# Patient Record
Sex: Female | Born: 1935 | Race: Black or African American | Hispanic: No | Smoking: Never smoker
Health system: Southern US, Community
[De-identification: ages and names within clinical notes are randomized; demographics above are authoritative.]

## PROBLEM LIST (undated history)

## (undated) DIAGNOSIS — I10 Essential (primary) hypertension: Secondary | ICD-10-CM

## (undated) DIAGNOSIS — E785 Hyperlipidemia, unspecified: Secondary | ICD-10-CM

## (undated) DIAGNOSIS — I701 Atherosclerosis of renal artery: Secondary | ICD-10-CM

## (undated) DIAGNOSIS — I255 Ischemic cardiomyopathy: Secondary | ICD-10-CM

## (undated) DIAGNOSIS — N189 Chronic kidney disease, unspecified: Secondary | ICD-10-CM

## (undated) DIAGNOSIS — I639 Cerebral infarction, unspecified: Secondary | ICD-10-CM

## (undated) DIAGNOSIS — F0390 Unspecified dementia without behavioral disturbance: Secondary | ICD-10-CM

## (undated) DIAGNOSIS — I2510 Atherosclerotic heart disease of native coronary artery without angina pectoris: Secondary | ICD-10-CM

## (undated) HISTORY — DX: Chronic kidney disease, unspecified: N18.9

## (undated) HISTORY — PX: APPENDECTOMY: SHX54

## (undated) HISTORY — DX: Atherosclerosis of renal artery: I70.1

## (undated) HISTORY — DX: Cerebral infarction, unspecified: I63.9

## (undated) HISTORY — DX: Unspecified dementia, unspecified severity, without behavioral disturbance, psychotic disturbance, mood disturbance, and anxiety: F03.90

## (undated) HISTORY — DX: Hyperlipidemia, unspecified: E78.5

## (undated) HISTORY — PX: TOTAL ABDOMINAL HYSTERECTOMY W/ BILATERAL SALPINGOOPHORECTOMY: SHX83

## (undated) HISTORY — DX: Essential (primary) hypertension: I10

## (undated) HISTORY — DX: Ischemic cardiomyopathy: I25.5

## (undated) HISTORY — PX: ANGIOPLASTY: SHX39

## (undated) HISTORY — DX: Atherosclerotic heart disease of native coronary artery without angina pectoris: I25.10

---

## 2002-03-20 ENCOUNTER — Encounter: Payer: Self-pay | Admitting: Emergency Medicine

## 2002-03-21 ENCOUNTER — Inpatient Hospital Stay (HOSPITAL_COMMUNITY): Admission: EM | Admit: 2002-03-21 | Discharge: 2002-04-04 | Payer: Self-pay | Admitting: *Deleted

## 2002-03-21 ENCOUNTER — Encounter: Payer: Self-pay | Admitting: Cardiology

## 2002-03-22 ENCOUNTER — Encounter: Payer: Self-pay | Admitting: *Deleted

## 2002-04-01 ENCOUNTER — Encounter: Payer: Self-pay | Admitting: Cardiology

## 2002-04-01 ENCOUNTER — Encounter: Payer: Self-pay | Admitting: Internal Medicine

## 2002-04-12 ENCOUNTER — Encounter: Admission: RE | Admit: 2002-04-12 | Discharge: 2002-04-12 | Payer: Self-pay | Admitting: Internal Medicine

## 2002-04-13 ENCOUNTER — Encounter: Payer: Self-pay | Admitting: *Deleted

## 2002-04-13 ENCOUNTER — Ambulatory Visit (HOSPITAL_COMMUNITY): Admission: RE | Admit: 2002-04-13 | Discharge: 2002-04-13 | Payer: Self-pay | Admitting: *Deleted

## 2002-05-27 ENCOUNTER — Encounter: Payer: Self-pay | Admitting: Gastroenterology

## 2002-05-31 ENCOUNTER — Ambulatory Visit (HOSPITAL_COMMUNITY): Admission: RE | Admit: 2002-05-31 | Discharge: 2002-05-31 | Payer: Self-pay | Admitting: Gastroenterology

## 2002-05-31 ENCOUNTER — Encounter: Payer: Self-pay | Admitting: Gastroenterology

## 2002-06-08 ENCOUNTER — Encounter: Admission: RE | Admit: 2002-06-08 | Discharge: 2002-09-06 | Payer: Self-pay | Admitting: *Deleted

## 2002-08-05 ENCOUNTER — Inpatient Hospital Stay (HOSPITAL_COMMUNITY): Admission: RE | Admit: 2002-08-05 | Discharge: 2002-08-13 | Payer: Self-pay | Admitting: Internal Medicine

## 2002-08-05 ENCOUNTER — Encounter: Admission: RE | Admit: 2002-08-05 | Discharge: 2002-08-05 | Payer: Self-pay | Admitting: Internal Medicine

## 2002-08-25 ENCOUNTER — Encounter: Admission: RE | Admit: 2002-08-25 | Discharge: 2002-08-25 | Payer: Self-pay | Admitting: Internal Medicine

## 2002-09-10 ENCOUNTER — Emergency Department (HOSPITAL_COMMUNITY): Admission: EM | Admit: 2002-09-10 | Discharge: 2002-09-10 | Payer: Self-pay | Admitting: Emergency Medicine

## 2002-09-20 ENCOUNTER — Encounter: Admission: RE | Admit: 2002-09-20 | Discharge: 2002-09-20 | Payer: Self-pay | Admitting: Internal Medicine

## 2002-10-18 ENCOUNTER — Emergency Department (HOSPITAL_COMMUNITY): Admission: EM | Admit: 2002-10-18 | Discharge: 2002-10-18 | Payer: Self-pay

## 2002-10-26 ENCOUNTER — Encounter: Admission: RE | Admit: 2002-10-26 | Discharge: 2002-10-26 | Payer: Self-pay | Admitting: Internal Medicine

## 2002-11-09 ENCOUNTER — Encounter: Admission: RE | Admit: 2002-11-09 | Discharge: 2002-11-09 | Payer: Self-pay | Admitting: Internal Medicine

## 2002-11-11 ENCOUNTER — Encounter: Admission: RE | Admit: 2002-11-11 | Discharge: 2003-07-13 | Payer: Self-pay | Admitting: *Deleted

## 2002-12-27 ENCOUNTER — Encounter: Admission: RE | Admit: 2002-12-27 | Discharge: 2003-03-27 | Payer: Self-pay | Admitting: *Deleted

## 2003-01-03 ENCOUNTER — Encounter: Admission: RE | Admit: 2003-01-03 | Discharge: 2003-01-03 | Payer: Self-pay | Admitting: Internal Medicine

## 2003-01-14 ENCOUNTER — Ambulatory Visit (HOSPITAL_COMMUNITY): Admission: RE | Admit: 2003-01-14 | Discharge: 2003-01-14 | Payer: Self-pay | Admitting: Internal Medicine

## 2003-01-19 ENCOUNTER — Encounter: Admission: RE | Admit: 2003-01-19 | Discharge: 2003-01-19 | Payer: Self-pay | Admitting: Internal Medicine

## 2003-02-09 ENCOUNTER — Emergency Department (HOSPITAL_COMMUNITY): Admission: EM | Admit: 2003-02-09 | Discharge: 2003-02-10 | Payer: Self-pay | Admitting: Emergency Medicine

## 2003-02-16 ENCOUNTER — Encounter: Admission: RE | Admit: 2003-02-16 | Discharge: 2003-02-16 | Payer: Self-pay | Admitting: Internal Medicine

## 2003-03-18 ENCOUNTER — Encounter: Admission: RE | Admit: 2003-03-18 | Discharge: 2003-03-18 | Payer: Self-pay | Admitting: Internal Medicine

## 2003-06-01 ENCOUNTER — Encounter: Admission: RE | Admit: 2003-06-01 | Discharge: 2003-06-01 | Payer: Self-pay | Admitting: Internal Medicine

## 2003-07-04 ENCOUNTER — Encounter: Admission: RE | Admit: 2003-07-04 | Discharge: 2003-07-04 | Payer: Self-pay | Admitting: Internal Medicine

## 2003-07-05 ENCOUNTER — Emergency Department (HOSPITAL_COMMUNITY): Admission: EM | Admit: 2003-07-05 | Discharge: 2003-07-05 | Payer: Self-pay | Admitting: Emergency Medicine

## 2003-07-08 ENCOUNTER — Encounter: Admission: RE | Admit: 2003-07-08 | Discharge: 2003-07-08 | Payer: Self-pay | Admitting: Internal Medicine

## 2003-08-11 ENCOUNTER — Encounter (HOSPITAL_COMMUNITY): Admission: RE | Admit: 2003-08-11 | Discharge: 2003-11-09 | Payer: Self-pay | Admitting: Nephrology

## 2003-08-25 ENCOUNTER — Encounter: Admission: RE | Admit: 2003-08-25 | Discharge: 2003-08-25 | Payer: Self-pay | Admitting: Internal Medicine

## 2003-08-29 ENCOUNTER — Encounter: Admission: RE | Admit: 2003-08-29 | Discharge: 2003-08-29 | Payer: Self-pay | Admitting: Internal Medicine

## 2003-09-05 ENCOUNTER — Encounter: Admission: RE | Admit: 2003-09-05 | Discharge: 2003-09-05 | Payer: Self-pay | Admitting: Internal Medicine

## 2003-11-15 ENCOUNTER — Ambulatory Visit (HOSPITAL_COMMUNITY): Admission: RE | Admit: 2003-11-15 | Discharge: 2003-11-16 | Payer: Self-pay | Admitting: Cardiology

## 2003-11-15 ENCOUNTER — Encounter (HOSPITAL_COMMUNITY): Admission: RE | Admit: 2003-11-15 | Discharge: 2004-02-13 | Payer: Self-pay | Admitting: Nephrology

## 2003-12-23 ENCOUNTER — Ambulatory Visit: Payer: Self-pay | Admitting: Internal Medicine

## 2004-01-16 ENCOUNTER — Ambulatory Visit (HOSPITAL_COMMUNITY): Admission: RE | Admit: 2004-01-16 | Discharge: 2004-01-16 | Payer: Self-pay | Admitting: Internal Medicine

## 2004-02-10 ENCOUNTER — Ambulatory Visit: Payer: Self-pay | Admitting: Cardiology

## 2004-03-01 ENCOUNTER — Encounter (HOSPITAL_COMMUNITY): Admission: RE | Admit: 2004-03-01 | Discharge: 2004-05-30 | Payer: Self-pay | Admitting: Nephrology

## 2004-03-16 ENCOUNTER — Ambulatory Visit: Payer: Self-pay | Admitting: Internal Medicine

## 2004-03-20 ENCOUNTER — Ambulatory Visit: Payer: Self-pay | Admitting: Internal Medicine

## 2004-03-21 ENCOUNTER — Encounter: Admission: RE | Admit: 2004-03-21 | Discharge: 2004-06-19 | Payer: Self-pay | Admitting: Internal Medicine

## 2004-04-16 ENCOUNTER — Ambulatory Visit: Payer: Self-pay | Admitting: Cardiology

## 2004-05-18 ENCOUNTER — Ambulatory Visit: Payer: Self-pay | Admitting: Cardiology

## 2004-06-07 ENCOUNTER — Encounter (HOSPITAL_COMMUNITY): Admission: RE | Admit: 2004-06-07 | Discharge: 2004-09-05 | Payer: Self-pay | Admitting: Nephrology

## 2004-09-13 ENCOUNTER — Encounter (HOSPITAL_COMMUNITY): Admission: RE | Admit: 2004-09-13 | Discharge: 2004-12-12 | Payer: Self-pay | Admitting: Nephrology

## 2004-11-26 ENCOUNTER — Ambulatory Visit: Payer: Self-pay | Admitting: Cardiology

## 2004-12-20 ENCOUNTER — Encounter (HOSPITAL_COMMUNITY): Admission: RE | Admit: 2004-12-20 | Discharge: 2005-03-20 | Payer: Self-pay | Admitting: Nephrology

## 2005-04-02 ENCOUNTER — Encounter (HOSPITAL_COMMUNITY): Admission: RE | Admit: 2005-04-02 | Discharge: 2005-07-01 | Payer: Self-pay | Admitting: Nephrology

## 2005-07-09 ENCOUNTER — Encounter (HOSPITAL_COMMUNITY): Admission: RE | Admit: 2005-07-09 | Discharge: 2005-10-07 | Payer: Self-pay | Admitting: Nephrology

## 2005-09-24 ENCOUNTER — Ambulatory Visit: Payer: Self-pay | Admitting: Internal Medicine

## 2005-09-26 ENCOUNTER — Ambulatory Visit (HOSPITAL_COMMUNITY): Admission: RE | Admit: 2005-09-26 | Discharge: 2005-09-26 | Payer: Self-pay | Admitting: Obstetrics and Gynecology

## 2005-10-15 ENCOUNTER — Encounter (HOSPITAL_COMMUNITY): Admission: RE | Admit: 2005-10-15 | Discharge: 2005-11-27 | Payer: Self-pay | Admitting: Nephrology

## 2005-11-14 ENCOUNTER — Ambulatory Visit: Payer: Self-pay | Admitting: Internal Medicine

## 2005-11-14 ENCOUNTER — Inpatient Hospital Stay (HOSPITAL_COMMUNITY): Admission: AD | Admit: 2005-11-14 | Discharge: 2005-11-16 | Payer: Self-pay | Admitting: Internal Medicine

## 2005-11-21 ENCOUNTER — Ambulatory Visit: Payer: Self-pay | Admitting: Internal Medicine

## 2005-11-28 ENCOUNTER — Ambulatory Visit: Payer: Self-pay | Admitting: Internal Medicine

## 2005-12-09 ENCOUNTER — Encounter (HOSPITAL_COMMUNITY): Admission: RE | Admit: 2005-12-09 | Discharge: 2006-03-09 | Payer: Self-pay | Admitting: Nephrology

## 2005-12-10 ENCOUNTER — Ambulatory Visit: Payer: Self-pay | Admitting: Internal Medicine

## 2005-12-12 ENCOUNTER — Ambulatory Visit: Payer: Self-pay | Admitting: Cardiology

## 2006-01-13 DIAGNOSIS — I509 Heart failure, unspecified: Secondary | ICD-10-CM

## 2006-01-13 DIAGNOSIS — I10 Essential (primary) hypertension: Secondary | ICD-10-CM

## 2006-01-13 DIAGNOSIS — I739 Peripheral vascular disease, unspecified: Secondary | ICD-10-CM

## 2006-01-13 DIAGNOSIS — I701 Atherosclerosis of renal artery: Secondary | ICD-10-CM

## 2006-01-13 DIAGNOSIS — I2510 Atherosclerotic heart disease of native coronary artery without angina pectoris: Secondary | ICD-10-CM

## 2006-01-13 DIAGNOSIS — E139 Other specified diabetes mellitus without complications: Secondary | ICD-10-CM | POA: Insufficient documentation

## 2006-01-13 DIAGNOSIS — N184 Chronic kidney disease, stage 4 (severe): Secondary | ICD-10-CM | POA: Insufficient documentation

## 2006-01-13 DIAGNOSIS — I252 Old myocardial infarction: Secondary | ICD-10-CM

## 2006-03-10 ENCOUNTER — Encounter (HOSPITAL_COMMUNITY): Admission: RE | Admit: 2006-03-10 | Discharge: 2006-06-08 | Payer: Self-pay | Admitting: Nephrology

## 2006-04-16 ENCOUNTER — Ambulatory Visit: Payer: Self-pay | Admitting: Internal Medicine

## 2006-04-16 ENCOUNTER — Encounter (INDEPENDENT_AMBULATORY_CARE_PROVIDER_SITE_OTHER): Payer: Self-pay | Admitting: *Deleted

## 2006-04-16 LAB — CONVERTED CEMR LAB
Glucose, Bld: 279 mg/dL
Hgb A1c MFr Bld: 6.5 %

## 2006-05-19 ENCOUNTER — Encounter (INDEPENDENT_AMBULATORY_CARE_PROVIDER_SITE_OTHER): Payer: Self-pay | Admitting: *Deleted

## 2006-06-10 ENCOUNTER — Encounter (HOSPITAL_COMMUNITY): Admission: RE | Admit: 2006-06-10 | Discharge: 2006-09-08 | Payer: Self-pay | Admitting: Nephrology

## 2006-07-21 ENCOUNTER — Telehealth: Payer: Self-pay | Admitting: *Deleted

## 2006-07-23 ENCOUNTER — Encounter: Payer: Self-pay | Admitting: Internal Medicine

## 2006-07-23 ENCOUNTER — Telehealth (INDEPENDENT_AMBULATORY_CARE_PROVIDER_SITE_OTHER): Payer: Self-pay | Admitting: *Deleted

## 2006-07-24 ENCOUNTER — Ambulatory Visit: Payer: Self-pay | Admitting: Internal Medicine

## 2006-07-24 LAB — CONVERTED CEMR LAB
Blood Glucose, Fingerstick: 151
Hgb A1c MFr Bld: 6 %

## 2006-07-28 ENCOUNTER — Encounter: Payer: Self-pay | Admitting: Internal Medicine

## 2006-08-19 ENCOUNTER — Telehealth (INDEPENDENT_AMBULATORY_CARE_PROVIDER_SITE_OTHER): Payer: Self-pay | Admitting: *Deleted

## 2006-08-19 ENCOUNTER — Encounter (INDEPENDENT_AMBULATORY_CARE_PROVIDER_SITE_OTHER): Payer: Self-pay | Admitting: *Deleted

## 2006-08-28 ENCOUNTER — Encounter: Payer: Self-pay | Admitting: Internal Medicine

## 2006-09-02 ENCOUNTER — Telehealth (INDEPENDENT_AMBULATORY_CARE_PROVIDER_SITE_OTHER): Payer: Self-pay | Admitting: *Deleted

## 2006-09-08 ENCOUNTER — Ambulatory Visit: Payer: Self-pay | Admitting: Internal Medicine

## 2006-09-08 ENCOUNTER — Encounter: Payer: Self-pay | Admitting: Internal Medicine

## 2006-09-09 LAB — CONVERTED CEMR LAB
ALT: 13 units/L (ref 0–35)
AST: 18 units/L (ref 0–37)
Albumin: 4.5 g/dL (ref 3.5–5.2)
Alkaline Phosphatase: 74 units/L (ref 39–117)
BUN: 24 mg/dL — ABNORMAL HIGH (ref 6–23)
CO2: 23 meq/L (ref 19–32)
Calcium: 9.7 mg/dL (ref 8.4–10.5)
Chloride: 102 meq/L (ref 96–112)
Cholesterol: 201 mg/dL — ABNORMAL HIGH (ref 0–200)
Creatinine, Ser: 1.96 mg/dL — ABNORMAL HIGH (ref 0.40–1.20)
Glucose, Bld: 228 mg/dL — ABNORMAL HIGH (ref 70–99)
HDL: 42 mg/dL (ref >39)
LDL Cholesterol: 125 mg/dL — ABNORMAL HIGH (ref 0–99)
Potassium: 3.9 meq/L (ref 3.5–5.3)
Sodium: 140 meq/L (ref 135–145)
Total Bilirubin: 0.9 mg/dL (ref 0.3–1.2)
Total CHOL/HDL Ratio: 4.8
Total Protein: 7.7 g/dL (ref 6.0–8.3)
Triglycerides: 169 mg/dL — ABNORMAL HIGH (ref <150)
VLDL: 34 mg/dL (ref 0–40)

## 2006-09-29 ENCOUNTER — Ambulatory Visit (HOSPITAL_COMMUNITY): Admission: RE | Admit: 2006-09-29 | Discharge: 2006-09-29 | Payer: Self-pay | Admitting: Internal Medicine

## 2006-10-07 ENCOUNTER — Encounter: Payer: Self-pay | Admitting: Internal Medicine

## 2006-10-07 ENCOUNTER — Encounter (HOSPITAL_COMMUNITY): Admission: RE | Admit: 2006-10-07 | Discharge: 2007-01-05 | Payer: Self-pay | Admitting: Nephrology

## 2006-10-16 ENCOUNTER — Telehealth: Payer: Self-pay | Admitting: Infectious Disease

## 2006-10-31 ENCOUNTER — Ambulatory Visit: Payer: Self-pay | Admitting: Internal Medicine

## 2006-10-31 DIAGNOSIS — E785 Hyperlipidemia, unspecified: Secondary | ICD-10-CM

## 2006-10-31 LAB — CONVERTED CEMR LAB
Blood Glucose, Fingerstick: 489
Hgb A1c MFr Bld: 7.2 %

## 2006-11-04 ENCOUNTER — Encounter: Payer: Self-pay | Admitting: Internal Medicine

## 2007-01-06 ENCOUNTER — Encounter (HOSPITAL_COMMUNITY): Admission: RE | Admit: 2007-01-06 | Discharge: 2007-03-20 | Payer: Self-pay | Admitting: Nephrology

## 2007-02-04 ENCOUNTER — Telehealth: Payer: Self-pay | Admitting: Internal Medicine

## 2007-03-24 ENCOUNTER — Telehealth: Payer: Self-pay | Admitting: Infectious Disease

## 2007-03-31 ENCOUNTER — Encounter: Admission: RE | Admit: 2007-03-31 | Discharge: 2007-06-29 | Payer: Self-pay | Admitting: Nephrology

## 2007-03-31 ENCOUNTER — Other Ambulatory Visit: Payer: Self-pay

## 2007-04-17 ENCOUNTER — Ambulatory Visit: Payer: Self-pay | Admitting: Internal Medicine

## 2007-04-17 ENCOUNTER — Encounter: Payer: Self-pay | Admitting: Internal Medicine

## 2007-04-17 ENCOUNTER — Ambulatory Visit (HOSPITAL_COMMUNITY): Admission: RE | Admit: 2007-04-17 | Discharge: 2007-04-17 | Payer: Self-pay | Admitting: Internal Medicine

## 2007-04-17 LAB — CONVERTED CEMR LAB
Albumin: 4 g/dL (ref 3.5–5.2)
BUN: 23 mg/dL (ref 6–23)
Basophils Absolute: 0 10*3/uL (ref 0.0–0.1)
Basophils Relative: 0 % (ref 0–1)
Blood Glucose, Fingerstick: 348
CO2: 22 meq/L (ref 19–32)
Calcium: 9.7 mg/dL (ref 8.4–10.5)
Chloride: 103 meq/L (ref 96–112)
Cholesterol: 211 mg/dL — ABNORMAL HIGH (ref 0–200)
Creatinine, Ser: 1.77 mg/dL — ABNORMAL HIGH (ref 0.40–1.20)
Eosinophils Absolute: 0.1 10*3/uL (ref 0.0–0.7)
Eosinophils Relative: 1 % (ref 0–5)
Glucose, Bld: 326 mg/dL — ABNORMAL HIGH (ref 70–99)
HCT: 38.4 % (ref 36.0–46.0)
HDL: 41 mg/dL (ref >39)
Hemoglobin: 12.3 g/dL (ref 12.0–15.0)
Hgb A1c MFr Bld: 7.4 %
LDL Cholesterol: 130 mg/dL — ABNORMAL HIGH (ref 0–99)
Lymphocytes Relative: 33 % (ref 12–46)
Lymphs Abs: 1.9 10*3/uL (ref 0.7–4.0)
MCHC: 32 g/dL (ref 30.0–36.0)
MCV: 97.5 fL (ref 78.0–100.0)
Monocytes Absolute: 0.4 10*3/uL (ref 0.1–1.0)
Monocytes Relative: 7 % (ref 3–12)
Neutro Abs: 3.3 10*3/uL (ref 1.7–7.7)
Neutrophils Relative %: 59 % (ref 43–77)
Phosphorus: 3 mg/dL (ref 2.3–4.6)
Platelets: 315 10*3/uL (ref 150–400)
Potassium: 4.5 meq/L (ref 3.5–5.3)
RBC: 3.94 M/uL (ref 3.87–5.11)
RDW: 14.1 % (ref 11.5–15.5)
Sodium: 139 meq/L (ref 135–145)
Total CHOL/HDL Ratio: 5.1
Triglycerides: 200 mg/dL — ABNORMAL HIGH (ref <150)
VLDL: 40 mg/dL (ref 0–40)
WBC: 5.7 10*3/uL (ref 4.0–10.5)

## 2007-05-21 ENCOUNTER — Encounter: Payer: Self-pay | Admitting: Internal Medicine

## 2007-05-22 ENCOUNTER — Ambulatory Visit: Payer: Self-pay | Admitting: *Deleted

## 2007-05-22 ENCOUNTER — Ambulatory Visit: Payer: Self-pay | Admitting: Infectious Diseases

## 2007-05-22 LAB — CONVERTED CEMR LAB: Blood Glucose, Fingerstick: 325

## 2007-05-25 ENCOUNTER — Telehealth (INDEPENDENT_AMBULATORY_CARE_PROVIDER_SITE_OTHER): Payer: Self-pay | Admitting: *Deleted

## 2007-05-27 ENCOUNTER — Encounter (INDEPENDENT_AMBULATORY_CARE_PROVIDER_SITE_OTHER): Payer: Self-pay | Admitting: *Deleted

## 2007-06-04 ENCOUNTER — Ambulatory Visit: Payer: Self-pay | Admitting: *Deleted

## 2007-06-04 LAB — CONVERTED CEMR LAB: Insulin/Carbohydrate Ratio: 1

## 2007-06-05 ENCOUNTER — Encounter: Payer: Self-pay | Admitting: Internal Medicine

## 2007-06-08 ENCOUNTER — Encounter: Payer: Self-pay | Admitting: Internal Medicine

## 2007-06-09 ENCOUNTER — Telehealth (INDEPENDENT_AMBULATORY_CARE_PROVIDER_SITE_OTHER): Payer: Self-pay | Admitting: *Deleted

## 2007-06-11 ENCOUNTER — Ambulatory Visit: Payer: Self-pay | Admitting: Hospitalist

## 2007-06-11 LAB — CONVERTED CEMR LAB: Insulin/Carbohydrate Ratio: 1

## 2007-07-02 ENCOUNTER — Encounter: Payer: Self-pay | Admitting: Internal Medicine

## 2007-07-02 ENCOUNTER — Ambulatory Visit: Payer: Self-pay | Admitting: Internal Medicine

## 2007-07-07 ENCOUNTER — Telehealth: Payer: Self-pay | Admitting: *Deleted

## 2007-07-13 ENCOUNTER — Encounter: Payer: Self-pay | Admitting: Internal Medicine

## 2007-07-20 ENCOUNTER — Telehealth: Payer: Self-pay | Admitting: Internal Medicine

## 2007-07-21 ENCOUNTER — Encounter (HOSPITAL_COMMUNITY): Admission: RE | Admit: 2007-07-21 | Discharge: 2007-10-19 | Payer: Self-pay | Admitting: Nephrology

## 2007-07-22 ENCOUNTER — Telehealth: Payer: Self-pay | Admitting: Internal Medicine

## 2007-08-07 ENCOUNTER — Telehealth (INDEPENDENT_AMBULATORY_CARE_PROVIDER_SITE_OTHER): Payer: Self-pay | Admitting: *Deleted

## 2007-08-10 ENCOUNTER — Telehealth: Payer: Self-pay | Admitting: Internal Medicine

## 2007-09-14 ENCOUNTER — Encounter: Payer: Self-pay | Admitting: Internal Medicine

## 2007-10-27 ENCOUNTER — Ambulatory Visit: Payer: Self-pay | Admitting: Internal Medicine

## 2007-10-27 LAB — CONVERTED CEMR LAB
Blood Glucose, Fingerstick: 228
Hgb A1c MFr Bld: 7.1 %

## 2007-11-04 ENCOUNTER — Telehealth: Payer: Self-pay | Admitting: Internal Medicine

## 2007-11-09 ENCOUNTER — Telehealth: Payer: Self-pay | Admitting: Internal Medicine

## 2007-11-19 ENCOUNTER — Encounter: Payer: Self-pay | Admitting: Internal Medicine

## 2007-11-26 ENCOUNTER — Encounter (HOSPITAL_COMMUNITY): Admission: RE | Admit: 2007-11-26 | Discharge: 2008-02-24 | Payer: Self-pay | Admitting: Nephrology

## 2007-11-30 ENCOUNTER — Telehealth: Payer: Self-pay | Admitting: *Deleted

## 2007-11-30 ENCOUNTER — Ambulatory Visit: Payer: Self-pay | Admitting: Internal Medicine

## 2007-11-30 ENCOUNTER — Encounter (INDEPENDENT_AMBULATORY_CARE_PROVIDER_SITE_OTHER): Payer: Self-pay | Admitting: Internal Medicine

## 2007-11-30 LAB — CONVERTED CEMR LAB
Microalb Creat Ratio: 10.3 mg/g (ref 0.0–30.0)
OCCULT 1: POSITIVE

## 2007-12-04 ENCOUNTER — Ambulatory Visit (HOSPITAL_COMMUNITY): Admission: RE | Admit: 2007-12-04 | Discharge: 2007-12-04 | Payer: Self-pay | Admitting: Internal Medicine

## 2007-12-16 ENCOUNTER — Telehealth: Payer: Self-pay | Admitting: Internal Medicine

## 2007-12-28 ENCOUNTER — Ambulatory Visit: Payer: Self-pay | Admitting: Gastroenterology

## 2007-12-28 DIAGNOSIS — K219 Gastro-esophageal reflux disease without esophagitis: Secondary | ICD-10-CM | POA: Insufficient documentation

## 2008-01-15 ENCOUNTER — Ambulatory Visit: Payer: Self-pay | Admitting: Gastroenterology

## 2008-02-18 ENCOUNTER — Ambulatory Visit: Payer: Self-pay | Admitting: Internal Medicine

## 2008-02-18 LAB — CONVERTED CEMR LAB
Blood Glucose, Fingerstick: 295
Hgb A1c MFr Bld: 7.5 %

## 2008-03-15 ENCOUNTER — Encounter (HOSPITAL_COMMUNITY): Admission: RE | Admit: 2008-03-15 | Discharge: 2008-06-13 | Payer: Self-pay | Admitting: Nephrology

## 2008-03-30 ENCOUNTER — Encounter: Payer: Self-pay | Admitting: Internal Medicine

## 2008-05-17 ENCOUNTER — Encounter: Payer: Self-pay | Admitting: Internal Medicine

## 2008-05-31 ENCOUNTER — Telehealth: Payer: Self-pay | Admitting: *Deleted

## 2008-06-14 ENCOUNTER — Encounter: Payer: Self-pay | Admitting: Internal Medicine

## 2008-06-15 ENCOUNTER — Telehealth: Payer: Self-pay | Admitting: Internal Medicine

## 2008-06-21 ENCOUNTER — Encounter: Payer: Self-pay | Admitting: Internal Medicine

## 2008-08-02 ENCOUNTER — Telehealth: Payer: Self-pay | Admitting: *Deleted

## 2008-08-24 ENCOUNTER — Encounter: Payer: Self-pay | Admitting: Internal Medicine

## 2008-09-09 ENCOUNTER — Encounter: Payer: Self-pay | Admitting: Internal Medicine

## 2008-09-20 ENCOUNTER — Encounter (HOSPITAL_COMMUNITY): Admission: RE | Admit: 2008-09-20 | Discharge: 2008-12-19 | Payer: Self-pay | Admitting: Nephrology

## 2008-10-03 ENCOUNTER — Telehealth: Payer: Self-pay | Admitting: Internal Medicine

## 2008-11-08 ENCOUNTER — Encounter: Payer: Self-pay | Admitting: Internal Medicine

## 2008-11-08 ENCOUNTER — Ambulatory Visit (HOSPITAL_COMMUNITY): Admission: RE | Admit: 2008-11-08 | Discharge: 2008-11-08 | Payer: Self-pay | Admitting: Internal Medicine

## 2008-11-08 ENCOUNTER — Ambulatory Visit: Payer: Self-pay | Admitting: Internal Medicine

## 2008-11-08 DIAGNOSIS — R5381 Other malaise: Secondary | ICD-10-CM

## 2008-11-08 DIAGNOSIS — R5383 Other fatigue: Secondary | ICD-10-CM

## 2008-11-08 LAB — CONVERTED CEMR LAB
Blood Glucose, Fingerstick: 183
Hgb A1c MFr Bld: 8.4 %

## 2008-11-09 ENCOUNTER — Encounter: Payer: Self-pay | Admitting: Internal Medicine

## 2008-11-11 LAB — CONVERTED CEMR LAB
ALT: 10 units/L (ref 0–35)
AST: 19 units/L (ref 0–37)
Alkaline Phosphatase: 82 units/L (ref 39–117)
Creatinine, Ser: 1.92 mg/dL — ABNORMAL HIGH (ref 0.40–1.20)
Sodium: 139 meq/L (ref 135–145)
Total Bilirubin: 0.6 mg/dL (ref 0.3–1.2)
Total Protein: 7.6 g/dL (ref 6.0–8.3)

## 2008-12-06 ENCOUNTER — Encounter (INDEPENDENT_AMBULATORY_CARE_PROVIDER_SITE_OTHER): Payer: Self-pay | Admitting: *Deleted

## 2008-12-26 ENCOUNTER — Encounter (HOSPITAL_COMMUNITY): Admission: RE | Admit: 2008-12-26 | Discharge: 2009-03-26 | Payer: Self-pay | Admitting: Nephrology

## 2009-01-06 ENCOUNTER — Telehealth (INDEPENDENT_AMBULATORY_CARE_PROVIDER_SITE_OTHER): Payer: Self-pay | Admitting: *Deleted

## 2009-03-08 ENCOUNTER — Telehealth (INDEPENDENT_AMBULATORY_CARE_PROVIDER_SITE_OTHER): Payer: Self-pay | Admitting: *Deleted

## 2009-04-07 ENCOUNTER — Encounter (HOSPITAL_COMMUNITY): Admission: RE | Admit: 2009-04-07 | Discharge: 2009-07-06 | Payer: Self-pay | Admitting: Nephrology

## 2009-04-12 ENCOUNTER — Encounter: Payer: Self-pay | Admitting: Internal Medicine

## 2009-04-24 ENCOUNTER — Ambulatory Visit: Payer: Self-pay | Admitting: Internal Medicine

## 2009-04-24 LAB — CONVERTED CEMR LAB: Hgb A1c MFr Bld: 8.6 %

## 2009-05-09 ENCOUNTER — Telehealth: Payer: Self-pay | Admitting: Internal Medicine

## 2009-05-31 ENCOUNTER — Telehealth: Payer: Self-pay | Admitting: Internal Medicine

## 2009-06-28 ENCOUNTER — Telehealth: Payer: Self-pay | Admitting: Internal Medicine

## 2009-07-20 ENCOUNTER — Encounter (HOSPITAL_COMMUNITY): Admission: RE | Admit: 2009-07-20 | Discharge: 2009-10-18 | Payer: Self-pay | Admitting: Nephrology

## 2009-08-03 ENCOUNTER — Telehealth: Payer: Self-pay | Admitting: Internal Medicine

## 2009-08-09 ENCOUNTER — Emergency Department (HOSPITAL_COMMUNITY): Admission: EM | Admit: 2009-08-09 | Discharge: 2009-08-10 | Payer: Self-pay | Admitting: Emergency Medicine

## 2009-09-06 ENCOUNTER — Encounter: Payer: Self-pay | Admitting: Internal Medicine

## 2009-10-03 ENCOUNTER — Telehealth: Payer: Self-pay | Admitting: Internal Medicine

## 2009-10-05 ENCOUNTER — Telehealth: Payer: Self-pay | Admitting: Internal Medicine

## 2009-10-30 ENCOUNTER — Telehealth: Payer: Self-pay | Admitting: Internal Medicine

## 2009-10-31 ENCOUNTER — Encounter (HOSPITAL_COMMUNITY): Admission: RE | Admit: 2009-10-31 | Discharge: 2010-01-29 | Payer: Self-pay | Admitting: Nephrology

## 2009-11-29 ENCOUNTER — Encounter: Payer: Self-pay | Admitting: Internal Medicine

## 2010-03-08 ENCOUNTER — Telehealth (INDEPENDENT_AMBULATORY_CARE_PROVIDER_SITE_OTHER): Payer: Self-pay | Admitting: *Deleted

## 2010-03-21 ENCOUNTER — Ambulatory Visit: Admission: RE | Admit: 2010-03-21 | Discharge: 2010-03-21 | Payer: Self-pay | Source: Home / Self Care

## 2010-03-21 LAB — CONVERTED CEMR LAB
BUN: 22 mg/dL (ref 6–23)
Bilirubin Urine: NEGATIVE
Blood Glucose, Fingerstick: 178
CO2: 24 meq/L (ref 19–32)
Calcium: 9.3 mg/dL (ref 8.4–10.5)
Chloride: 103 meq/L (ref 96–112)
Creatinine, Ser: 1.66 mg/dL — ABNORMAL HIGH (ref 0.40–1.20)
Glucose, Bld: 167 mg/dL — ABNORMAL HIGH (ref 70–99)
Glucose, Urine, Semiquant: NEGATIVE
Hgb A1c MFr Bld: 7.6 %
Ketones, urine, test strip: NEGATIVE
Nitrite: NEGATIVE
Potassium: 4.3 meq/L (ref 3.5–5.3)
Protein, U semiquant: >=300
Sodium: 138 meq/L (ref 135–145)
Specific Gravity, Urine: <1.005
Urobilinogen, UA: 0.2
WBC Urine, dipstick: NEGATIVE
pH: 5.5

## 2010-03-26 LAB — GLUCOSE, CAPILLARY: Glucose-Capillary: 178 mg/dL — ABNORMAL HIGH (ref 70–99)

## 2010-04-02 ENCOUNTER — Encounter: Payer: Self-pay | Admitting: Internal Medicine

## 2010-04-05 ENCOUNTER — Telehealth: Payer: Self-pay | Admitting: *Deleted

## 2010-04-10 NOTE — Progress Notes (Signed)
Summary: Refill/gh  Phone Note Refill Request Message from:  Fax from Pharmacy on May 09, 2009 11:28 AM  Refills Requested: Medication #1:  NORVASC 10 MG TABS Take one tablet daily   Last Refilled: 04/03/2009  Method Requested: Electronic Initial call taken by: Angelina Ok RN,  May 09, 2009 11:29 AM  Follow-up for Phone Call        Refill approved-nurse to complete Follow-up by: Julaine Fusi  DO,  May 11, 2009 3:05 PM    Prescriptions: NORVASC 10 MG TABS (AMLODIPINE BESYLATE) Take one tablet daily  #31 x 11   Entered and Authorized by:   Julaine Fusi  DO   Signed by:   Julaine Fusi  DO on 05/11/2009   Method used:   Faxed to ...       [facility] (ret[facility])       [address]       [place], [place]  [address]       Ph: [phone]       Fax: [phone]   RxID:   [id number]

## 2010-04-10 NOTE — Assessment & Plan Note (Signed)
Summary: FU VISIT PER D. RILEY/DS   Vital Signs:  Patient profile:   77 year old female Height:      62.25 inches (158.12 cm) Weight:      138.01 pounds (62.73 kg) BMI:     25.13 Temp:     98.8 degrees F (37.11 degrees C) oral Pulse rate:   71 / minute BP sitting:   134 / 74  (left arm)  Vitals Entered By: Angelina Ok RN (April 24, 2009 10:37 AM) Is Patient Diabetic? Yes Did you bring your meter with you today? Yes Nutritional Status BMI of 19 -24 = normal CBG Result 521  Have you ever been in a relationship where you felt threatened, hurt or afraid?No   Does patient need assistance? Functional Status Self care Ambulation Impaired:Risk for fall Comments Out of Insulin.  Sugars are high.  Tired and dizzy.  Occurs early in the am.  Tired most of the day.  Check up   Primary Care Provider:  Marinda Elk, MD   History of Present Illness: Ms. Mordan comes in today for routine follow-up. I have not seen her in several months. Her husband has cancer and it seems her son is taking care of her. He is some what agressive when talking about her medications and is making an incorrect connection between her BP meds and blood sugar medications. He tells me that he only gives all of her medications once a day and that all he plans to do. He thinks giving her her medicines twice a day as prescribed makes her sugars "go bad". He was resistant to gentle and tactful re-education.When speaking to Ms. Emme, she says she fine, she is just interested in leaving teh office and seems a little more agitated than usual. Her memory seems much worse to me today. Her son reports that she has periods of confusion but thinks its "just old age". I cannot illicit how she is taking her insulin. Her CBGs are widely fluctuating and have no specific pattern but are much worse than a few months ago when I felt like I was making some progress.  Depression History:      The patient denies a depressed mood most  of the day and a diminished interest in her usual daily activities.         Preventive Screening-Counseling & Management  Alcohol-Tobacco     Smoking Status: never  Current Medications (verified): 1)  Lasix 40 Mg Tabs (Furosemide) .... Take 1 Tablet By Mouth Once A Day 2)  Hydralazine Hcl 50 Mg Tabs (Hydralazine Hcl) .... Take 1 Tablet By Mouth Once A Day 3)  Enalapril Maleate 10 Mg Tabs (Enalapril Maleate) .... Take 1 Tablet By Mouth Once A Day 4)  Isosorbide Dinitrate 20 Mg Tabs (Isosorbide Dinitrate) .... Take 1 Tablet By Mouth Once A Day 5)  Zebeta 5 Mg Tabs (Bisoprolol Fumarate) .... Take 1 Tablet By Mouth Once A Day 6)  Norvasc 10 Mg Tabs (Amlodipine Besylate) .... Take One Tablet Daily 7)  Calcitriol 0.25 Mcg Caps (Calcitriol) .... Take 1 Capsule By Mouth Once A Day 8)  Aranesp (Albumin Free) 60 Mcg/ml Soln (Darbepoetin Alfa-Polysorbate) .... Every 3 Weeks Subcutaneously 9)  Zocor 80 Mg  Tabs (Simvastatin) .... Take 1 Tablet By Mouth Once A Day 10)  Aspirin 81 Mg Tbec (Aspirin) .... Take 1 Tablet By Mouth Once A Day 11)  Lantus 100 Unit/ml  Soln (Insulin Glargine) .... Inject 15 Sq Units At Bedtime 12)  Freestyle Lite Test   Strp (Glucose Blood) .... Use To Test Blood Glucose 4x/day Before Meals 13)  Compact Test Strips Dc 51ct/ 91478 .... Test 3-4 Times A Day As Directed 14)  Accu-Chek Softclix Lancets   Misc (Lancets) .... Test 3-4 Times A Day As Directed 15)  Humalog Kwikpen 100 Unit/ml  Soln (Insulin Lispro (Human)) .... Take As Directed According To Stoplight Scale- Up To A Total of 25 Units/day 16)  Lantus Solostar 100 Unit/ml Soln (Insulin Glargine) .... Inject 20 Units At Bedtime or As Directed  Allergies (verified): No Known Drug Allergies  Review of Systems       Worsening memory loss, fatigue, decreased apetite. Otherwise neg.  Physical Exam  General:  alert and well-developed.   Lungs:  normal respiratory effort and normal breath sounds.   Heart:  brady,  regular Abdomen:  soft and non-tender.   Neurologic:  A&O. cranial nerves II-XII intact, strength normal in all extremities, sensation intact to light touch, sensation intact to pinprick, gait normal, and finger-to-nose normal.  Refused to answer memory questions saying they were "silly" and just laughed. Skin:  no rashes Psych:  mildly agitated   Impression & Recommendations:  Problem # 1:  DIABETES MELLITUS, TYPE II (ICD-250.00) Has not been using Lantus insulin.  Has been using Humalog 10 units three times a day only. No oral meds. I discussed the importance of starting on the lantus- her A1C is now 8.6 and I think a good goal for her would be 7.0. I tried educating her son. He was concerned about teh cost of teh medication and I think he is trying tostretch out all of her medications.  Her updated medication list for this problem includes:    Enalapril Maleate 10 Mg Tabs (Enalapril maleate) .Marland Kitchen... Take 1 tablet by mouth once a day    Aspirin 81 Mg Tbec (Aspirin) .Marland Kitchen... Take 1 tablet by mouth once a day    Lantus 100 Unit/ml Soln (Insulin glargine) ..... Inject 15 sq units at bedtime    Humalog Kwikpen 100 Unit/ml Soln (Insulin lispro (human)) .Marland Kitchen... Take as directed according to stoplight scale- up to a total of 25 units/day    Lantus Solostar 100 Unit/ml Soln (Insulin glargine) ..... Inject 20 units at bedtime or as directed  Orders: T- Capillary Blood Glucose (29562) T-Hgb A1C (in-house) (13086VH)  Labs Reviewed: Creat: 1.92 (11/08/2008)     Last Eye Exam: Cataract.  referred to Dr. Vonna Kotyk for cataract surgery Mild non-proliferative diabetic retinopathy.  os & od, s/p PRP OD, no further retina treatment indicated at this point  (12/06/2008) Reviewed HgBA1c results: 8.6 (04/24/2009)  8.4 (11/08/2008)  Problem # 2:  HYPERTENSION (ICD-401.9) Her BP is good today but she has taken her medication this AM.  I suspect it runs much higher at night when she is due for her two times a day  dosing.  I changed her medication list to reflect how she is taking them- teh son says he will not give them more frequently so I will change the number I am prescribing- as stated earlier I think he is trying to stretch out her meds due to cost etc... I realize these are not ideal doses of her vasodilators, but at least we can monitor her pressures and she how she is doing. I aslked him to get a BP cuff for home.   Her updated medication list for this problem includes:    Lasix 40 Mg Tabs (Furosemide) .Marland Kitchen... Take 1  tablet by mouth once a day    Hydralazine Hcl 50 Mg Tabs (Hydralazine hcl) .Marland Kitchen... Take 1 tablet by mouth once a day    Enalapril Maleate 10 Mg Tabs (Enalapril maleate) .Marland Kitchen... Take 1 tablet by mouth once a day    Zebeta 5 Mg Tabs (Bisoprolol fumarate) .Marland Kitchen... Take 1 tablet by mouth once a day    Norvasc 10 Mg Tabs (Amlodipine besylate) .Marland Kitchen... Take one tablet daily  BP today: 134/74 Prior BP: 174/80 (11/08/2008)  Labs Reviewed: K+: 4.0 (11/08/2008) Creat: : 1.92 (11/08/2008)   Chol: 211 (04/17/2007)   HDL: 41 (04/17/2007)   LDL: 130 (04/17/2007)   TG: 200 (04/17/2007)  Complete Medication List: 1)  Lasix 40 Mg Tabs (Furosemide) .... Take 1 tablet by mouth once a day 2)  Hydralazine Hcl 50 Mg Tabs (Hydralazine hcl) .... Take 1 tablet by mouth once a day 3)  Enalapril Maleate 10 Mg Tabs (Enalapril maleate) .... Take 1 tablet by mouth once a day 4)  Isosorbide Dinitrate 20 Mg Tabs (Isosorbide dinitrate) .... Take 1 tablet by mouth once a day 5)  Zebeta 5 Mg Tabs (Bisoprolol fumarate) .... Take 1 tablet by mouth once a day 6)  Norvasc 10 Mg Tabs (Amlodipine besylate) .... Take one tablet daily 7)  Calcitriol 0.25 Mcg Caps (Calcitriol) .... Take 1 capsule by mouth once a day 8)  Aranesp (albumin Free) 60 Mcg/ml Soln (Darbepoetin alfa-polysorbate) .... Every 3 weeks subcutaneously 9)  Zocor 80 Mg Tabs (Simvastatin) .... Take 1 tablet by mouth once a day 10)  Aspirin 81 Mg Tbec (Aspirin) ....  Take 1 tablet by mouth once a day 11)  Lantus 100 Unit/ml Soln (Insulin glargine) .... Inject 15 sq units at bedtime 12)  Freestyle Lite Test Strp (Glucose blood) .... Use to test blood glucose 4x/day before meals 13)  Compact Test Strips Dc 51ct/ [address]  .... Test 3-4 times a day as directed 14)  Accu-chek Softclix Lancets Misc (Lancets) .... Test 3-4 times a day as directed 15)  Humalog Kwikpen 100 Unit/ml Soln (Insulin lispro (human)) .... Take as directed according to stoplight scale- up to a total of 25 units/day 16)  Lantus Solostar 100 Unit/ml Soln (Insulin glargine) .... Inject 20 units at bedtime or as directed  Patient Instructions: 1)  Please schedule a follow-up appointment in 2-3  month. 2)  Jamison Neighbor appointment ASAP 3)  Use only 5 units of insulin for meal coverage after starting lantus insulin Prescriptions: LANTUS SOLOSTAR 100 UNIT/ML SOLN (INSULIN GLARGINE) Inject 20 units at bedtime or as directed  #1 month x 11   Entered and Authorized by:   Julaine Fusi  DO   Signed by:   Julaine Fusi  DO on 04/24/2009   Method used:   Print then Give to Patient   RxID:   [id number] HUMALOG KWIKPEN 100 UNIT/ML  SOLN (INSULIN LISPRO (HUMAN)) take as directed according to stoplight scale- up to a total of 25 units/day  #1 box x 11   Entered and Authorized by:   Julaine Fusi  DO   Signed by:   Julaine Fusi  DO on 04/24/2009   Method used:   Print then Give to Patient   RxID:   [id number] HUMALOG KWIKPEN 100 UNIT/ML  SOLN (INSULIN LISPRO (HUMAN)) take as directed according to stoplight scale- up to a total of 25 units/day  #1 box x 11   Entered and Authorized by:   Julaine Fusi  DO   Signed by:  Julaine Fusi  DO on 04/24/2009   Method used:   Faxed to ...       [facility] ([facility])       [address]       [place], [place]  [address]       Ph: [phone]       Fax: [phone]   RxID:   [id number] LANTUS SOLOSTAR 100 UNIT/ML SOLN  (INSULIN GLARGINE) Inject 20 units at bedtime or as directed  #1 month x 11   Entered and Authorized by:   Julaine Fusi  DO   Signed by:   Julaine Fusi  DO on 04/24/2009   Method used:   Faxed to ...       [facility] ([facility])       [address]       [place], [place]  [address]       Ph: [phone]       Fax: [phone]   RxID:   [id number]   Prevention & Chronic Care Immunizations   Influenza vaccine: Fluvax MCR  (11/30/2007)    Tetanus booster: Not documented    Pneumococcal vaccine: Not documented    H. zoster vaccine: Not documented  Colorectal Screening   Hemoccult: Not documented    Colonoscopy: Location:  [facility].    (01/15/2008)   Colonoscopy due: 01/2013  Other Screening   Pap smear: Not documented    Mammogram: Not documented    DXA bone density scan: Not documented   Smoking status: never  (04/24/2009)  Diabetes Mellitus   HgbA1C: 8.6  (04/24/2009)   Hemoglobin A1C due: 07/15/2007    Eye exam: Cataract.  referred to Dr. Vonna Kotyk for cataract surgery Mild non-proliferative diabetic retinopathy.  os & od, s/p PRP OD, no further retina treatment indicated at this point   (12/06/2008)   Eye exam due: 12/2009    Foot exam: yes  (02/18/2008)   High risk foot: No  (10/27/2007)   Foot care education: Done  (10/27/2007)   Foot exam due: 07/24/2007    Urine microalbumin/creatinine ratio: 10.3  (11/30/2007)  Lipids   Total Cholesterol: 211  (04/17/2007)   LDL: 130  (04/17/2007)   LDL Direct: Not documented   HDL: 41  (04/17/2007)   Triglycerides: 200  (04/17/2007)    SGOT (AST): 19  (11/08/2008)   SGPT (ALT): 10  (11/08/2008)   Alkaline phosphatase: 82  (11/08/2008)   Total bilirubin: 0.6  (11/08/2008)  Hypertension   Last Blood Pressure: 134 / 74  (04/24/2009)   Serum creatinine: 1.92  (11/08/2008)   Serum potassium 4.0  (11/08/2008)  Self-Management Support :    Patient will work on the  following items until the next clinic visit to reach self-care goals:     Medications and monitoring: take my medicines every day, check my blood sugar, bring all of my medications to every visit, examine my feet every day  (04/24/2009)     Eating: drink diet soda or water instead of juice or soda, eat more vegetables, eat foods that are low in salt, eat baked foods instead of fried foods, eat fruit for snacks and desserts  (04/24/2009)     Activity: take a 30 minute walk every day  (04/24/2009)    Diabetes self-management support: Copy of home glucose meter record  (04/24/2009)   Last diabetes self-management training by diabetes  educator: 05/22/2007   Last medical nutrition therapy: 06/11/2007    Hypertension self-management support: Not documented    Lipid self-management support: Not documented     Laboratory Results   Blood Tests   Date/Time Received: April 24, 2009 11:37 AM  Date/Time Reported: Burke Keels  April 24, 2009 11:37 AM   HGBA1C: 8.6%   (Normal Range: Non-Diabetic - 3-6%   Control Diabetic - 6-8%) CBG Random:: 521mg /dL  Comments: CBG reported to Sherlean Foot RN by [facility] at 11:27am 04-24-09 Burke Keels  April 24, 2009 11:38 AM

## 2010-04-10 NOTE — Consult Note (Signed)
Summary: [facility]   Imported By: Florinda Marker 05/16/2009 15:46:02  _____________________________________________________________________  External Attachment:    Type:   Image     Comment:   External Document

## 2010-04-10 NOTE — Progress Notes (Signed)
Summary: med refill/gp  Phone Note Refill Request Message from:  Fax from Pharmacy on Aug 03, 2009 10:43 AM  Refills Requested: Medication #1:  ENALAPRIL MALEATE 10 MG TABS Take 1 tablet by mouth once a day            and take 1 tablet each evening.             Or just once a day.  Thanks   Method Requested: Fax to Local Pharmacy Initial call taken by: Chinita Pester RN,  Aug 03, 2009 10:43 AM  Follow-up for Phone Call        Refill approved-nurse to complete Follow-up by: Julaine Fusi  DO,  August 15, 2009 4:30 PM    Prescriptions: ENALAPRIL MALEATE 10 MG TABS (ENALAPRIL MALEATE) Take 1 tablet by mouth once a day  #30 x 3   Entered and Authorized by:   Julaine Fusi  DO   Signed by:   Julaine Fusi  DO on 08/15/2009   Method used:   Faxed to ...       [facility] ([facility])       [address]       [place], [place]  [address]       Ph: [phone]       Fax: [phone]   RxID:   [id number] ENALAPRIL MALEATE 10 MG TABS (ENALAPRIL MALEATE) Take 1 tablet by mouth once a day  #0 x 0   Entered and Authorized by:   Julaine Fusi  DO   Signed by:   Julaine Fusi  DO on 08/15/2009   Method used:   Faxed to ...       [facility] ([facility])       [address]       [place], [place]  [address]       Ph: [phone]       Fax: [phone]   RxID:   [id number]

## 2010-04-10 NOTE — Progress Notes (Signed)
Summary: refill/gg  Phone Note Refill Request  on June 28, 2009 4:32 PM  Refills Requested: Medication #1:  LASIX 40 MG TABS Take 1 tablet by mouth once a day   Last Refilled: 06/02/2009  Method Requested: Fax to Local Pharmacy Initial call taken by: Merrie Roof RN,  June 28, 2009 4:33 PM  Follow-up for Phone Call        Refill approved-nurse to complete Follow-up by: Julaine Fusi  DO,  June 29, 2009 9:09 AM  Additional Follow-up for Phone Call Additional follow up Details #1::        Rx faxed to pharmacy Additional Follow-up by: Merrie Roof RN,  June 29, 2009 10:27 AM    Prescriptions: LASIX 40 MG TABS (FUROSEMIDE) Take 1 tablet by mouth once a day  #30 x 11   Entered and Authorized by:   Julaine Fusi  DO   Signed by:   Julaine Fusi  DO on 06/29/2009   Method used:   Telephoned to ...       [facility])       [address]       [place], [place]  [address]       Ph: [phone]       Fax: [phone]   RxID:   [id number]

## 2010-04-10 NOTE — Consult Note (Signed)
Summary: [facility]  [facility]   Imported By: Louretta Parma 01/16/2010 09:51:43  _____________________________________________________________________  External Attachment:    Type:   Image     Comment:   External Document

## 2010-04-10 NOTE — Progress Notes (Signed)
Summary: refill/gg  Phone Note Refill Request  on May 31, 2009 11:39 AM  Refills Requested: Medication #1:  ACCU-CHEK SOFTCLIX LANCETS   MISC Test 3-4 times a day as directed  Medication #2:  COMPACT TEST STRIPS DC 51CT/ [address]45[address] Test 3-4 times a day as directed  Method Requested: Fax to Local Pharmacy Initial call taken by: Merrie Roof RN,  May 31, 2009 11:39 AM  Follow-up for Phone Call        Refill approved-nurse to complete Follow-up by: Julaine Fusi  DO,  June 05, 2009 4:16 PM    Prescriptions: COMPACT TEST STRIPS DC 51CT/ [address] Test 3-4 times a day as directed  #360 x 11   Entered and Authorized by:   Julaine Fusi  DO   Signed by:   Julaine Fusi  DO on 06/05/2009   Method used:   Telephoned to ...       [facility] Environmental education officer)       [address]       [place], [place]  [address]       Ph: [phone]       Fax: [phone]   RxID:   [id number] ACCU-CHEK SOFTCLIX LANCETS   MISC (LANCETS) Test 3-4 times a day as directed  #360 x 11   Entered and Authorized by:   Julaine Fusi  DO   Signed by:   Julaine Fusi  DO on 06/05/2009   Method used:   Telephoned to ...       Chief Executive Officer Environmental education officer)       [address]       [place], [place]  [address]       Ph: [phone]       Fax: [phone]   RxID:   [id number]

## 2010-04-10 NOTE — Consult Note (Signed)
Summary: [facility]: Diabetic Eye Exam  [facility]: Diabetic Eye Exam   Imported By: Florinda Marker 03/14/2009 16:14:02  _____________________________________________________________________  External Attachment:    Type:   Image     Comment:   External Document  Appended Document: [facility]: Diabetic Eye Exam    Clinical Lists Changes  Observations: Added new observation of DMEYEEXAMNXT: 12/2009 (03/15/2009 9:20) Added new observation of DIAB EYE EX: Cataract.  referred to Dr. Vonna Kotyk for cataract surgery Mild non-proliferative diabetic retinopathy.  os & od, s/p PRP OD, no further retina treatment indicated at this point  (12/06/2008 9:26)       Diabetic Eye Exam  Procedure date:  12/06/2008  Findings:      Cataract.  referred to Dr. Vonna Kotyk for cataract surgery Mild non-proliferative diabetic retinopathy.  os & od, s/p PRP OD, no further retina treatment indicated at this point   Procedures Next Due Date:    Diabetic Eye Exam: 12/2009   Diabetic Eye Exam  Procedure date:  12/06/2008  Findings:      Cataract.  referred to Dr. Vonna Kotyk for cataract surgery Mild non-proliferative diabetic retinopathy.  os & od, s/p PRP OD, no further retina treatment indicated at this point   Procedures Next Due Date:    Diabetic Eye Exam: 12/2009

## 2010-04-10 NOTE — Progress Notes (Signed)
Summary: Refill/gh  Phone Note Refill Request Message from:  Fax from Pharmacy on October 03, 2009 11:50 AM  Refills Requested: Medication #1:  ZEBETA 5 MG TABS Take 1 tablet by mouth once a day   Last Refilled: 08/31/2009  Method Requested: Fax to Local Pharmacy Initial call taken by: Angelina Ok RN,  October 03, 2009 11:50 AM  Follow-up for Phone Call        Refill approved-nurse to complete Follow-up by: Julaine Fusi  DO,  October 03, 2009 3:32 PM    Prescriptions: ZEBETA 5 MG TABS (BISOPROLOL FUMARATE) Take 1 tablet by mouth once a day  #30 x 5   Entered and Authorized by:   Julaine Fusi  DO   Signed by:   Julaine Fusi  DO on 10/03/2009   Method used:   Faxed to ...       [facility] ([facility])       [address]       [place], [place]  [address]       Ph: [phone]       Fax: [phone]   RxID:   [id number]

## 2010-04-10 NOTE — Progress Notes (Signed)
Summary: Refill/gh  Phone Note Refill Request Message from:  Fax from Pharmacy on October 05, 2009 11:08 AM  Refills Requested: Medication #1:  ISOSORBIDE DINITRATE 20 MG TABS Take 1 tablet by mouth once a day   Last Refilled: 04/03/2009  Method Requested: Electronic Initial call taken by: Angelina Ok RN,  October 05, 2009 11:08 AM  Follow-up for Phone Call        Refill approved-nurse to complete Follow-up by: Julaine Fusi  DO,  October 05, 2009 3:02 PM    Prescriptions: ISOSORBIDE DINITRATE 20 MG TABS (ISOSORBIDE DINITRATE) Take 1 tablet by mouth once a day  #30 x 6   Entered and Authorized by:   Julaine Fusi  DO   Signed by:   Julaine Fusi  DO on 10/05/2009   Method used:   Faxed to ...       [facility] ([facility])       [address]       [place], [place]  [address]       Ph: [phone]       Fax: [phone]   RxID:   [id number]

## 2010-04-10 NOTE — Progress Notes (Signed)
Summary: Refill/gh  Phone Note Refill Request Message from:  Fax from Pharmacy on October 30, 2009 4:53 PM  Refills Requested: Medication #1:  ZOCOR 80 MG  TABS Take 1 tablet by mouth once a day   Last Refilled: 09/16/2009  Method Requested: Electronic Initial call taken by: Angelina Ok RN,  October 30, 2009 4:54 PM  Follow-up for Phone Call        WIll need f/u appointment to discuss med change since she is on high dose zocor- any provider is fine. Follow-up by: Julaine Fusi  DO,  October 31, 2009 9:02 AM  Additional Follow-up for Phone Call Additional follow up Details #1::        Flag to scheduler to call pt wilth an appointment. Angelina Ok RN  October 31, 2009 12:16 PM  Additional Follow-up by: Angelina Ok RN,  October 31, 2009 12:16 PM    New/Updated Medications: ZOCOR 80 MG  TABS (SIMVASTATIN) Take 1 tablet by mouth once a day Prescriptions: ZOCOR 80 MG  TABS (SIMVASTATIN) Take 1 tablet by mouth once a day  #30 x 0   Entered and Authorized by:   Julaine Fusi  DO   Signed by:   Julaine Fusi  DO on 10/31/2009   Method used:   Faxed to ...       [facility] ([facility])       [address]       [place], [place]  [address]       Ph: [phone]       Fax: [phone]   RxID:   [id number]

## 2010-04-12 NOTE — Progress Notes (Signed)
Summary: phone/gg  Phone Note Call from Patient   Caller: Daughter Summary of Call: Call from Lorenzo, daughter of pt.  She is asking to talk with one of her mothers doctors.  Her mom has had a few episodes of doing strange things.  She has gone outside without  proper clothers on, not wanting to take meds and not knowing family members. They are not sure what to do.  Could she get more meds? # [id number]   ( cell [id number] ) Initial call taken by: Merrie Roof RN,  March 08, 2010 4:38 PM  Follow-up for Phone Call        I know Ms. 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 Follow-up by: Julaine Fusi  DO,  March 08, 2010 4:56 PM  Additional Follow-up for Phone Call Additional follow up Details #1::        Daughter informed and will  call Monday AM for appointment for f/u on dementia. I did advise ED evaluation today to r/o infection Additional Follow-up by: Merrie Roof RN,  March 08, 2010 5:12 PM

## 2010-04-12 NOTE — Assessment & Plan Note (Signed)
Summary: ACUTE-POSSIBLE UTI(GOLDING)/CFB   Vital Signs:  Patient profile:   77 year old female Height:      62.25 inches (158.12 cm) Weight:      145.6 pounds (66.18 kg) BMI:     26.51 Temp:     97.8 degrees F (36.56 degrees C) oral Pulse rate:   72 / minute BP sitting:   192 / 97  (right arm)  Vitals Entered By: Stanton Kidney Ditzler RN (March 21, 2010 2:39 PM) Is Patient Diabetic? Yes Did you bring your meter with you today? No Pain Assessment Patient in pain? no      Nutritional Status BMI of 25 - 29 = overweight Nutritional Status Detail appetite good CBG Result 178  Have you ever been in a relationship where you felt threatened, hurt or afraid?denies   Does patient need assistance? Functional Status Self care Ambulation Impaired:Risk for fall, Wheelchair Comments Daughter with pt - uses a cane and w/c. Brother helps with care. Ck-up - ? memory, injections, refuses to swollow pills, repeating words, temperment mean and up at night - goes to be 11PM to 1AM. Only taking Vasotec.   Primary Care Provider:  Julaine Fusi  DO   History of Present Illness: 77yo W with PMH of DM, HTN, dementia brought to the clinic by her 2 daughters for evaluation of increasing confusion and strange behavior at home. Over the past few months, her children have noticed that she is intermittently more confused and has occassionally wandered off at night, become fixated on finding objects that she no longer has or does not neeed, and sometimes acts "mean", which is not consistent with her usual personality. When asked why she is here today, the patient points at her daughters and says that they don't like how "I do what I want" or "don't do what they think I should do". Her daughters are concerned that she might have an infection, like a UTI, that is affecting her mental status or that she might have increasing dementia; if this is dementia, they want to know what can be done about it. Ms. Guevara denies  dysuria, urinary frequency/hesitancy, chills/fever, or other physical complaints.   Of note, Ms. Maske lives with her son, who manages her medications. Although she has apparently been getting her insulin, the only oral medication he has been giving her for the past few months is enalapril. He is concerned that her other medications are making her tired and contributing to her deteriorating mental status. He gives her the enalapril because he feels that this medication gives her more energy.   Depression History:      The patient denies a depressed mood most of the day and a diminished interest in her usual daily activities.         Preventive Screening-Counseling & Management  Alcohol-Tobacco     Smoking Status: never  Caffeine-Diet-Exercise     Does Patient Exercise: yes     Type of exercise: walking     Times/week: 7  Current Medications (verified): 1)  Lasix 40 Mg Tabs (Furosemide) .... Take 1 Tablet By Mouth Once A Day 2)  Enalapril Maleate 10 Mg Tabs (Enalapril Maleate) .... Take 1 Tablet By Mouth Once A Day 3)  Zebeta 5 Mg Tabs (Bisoprolol Fumarate) .... Take 1 Tablet By Mouth Once A Day 4)  Norvasc 10 Mg Tabs (Amlodipine Besylate) .... Take One Tablet Daily 5)  Calcitriol 0.25 Mcg Caps (Calcitriol) .... Take 1 Capsule By Mouth Once A Day 6)  Simvastatin 40 Mg Tabs (Simvastatin) .... Take 1 Tablet By Mouth Once A Day 7)  Aspirin 81 Mg Tbec (Aspirin) .... Take 1 Tablet By Mouth Once A Day 8)  Lantus 100 Unit/ml  Soln (Insulin Glargine) .... Inject 15 Sq Units At Bedtime 9)  Freestyle Lite Test   Strp (Glucose Blood) .... Use To Test Blood Glucose 4x/day Before Meals 10)  Compact Test Strips Dc 51ct/ 54098 .... Test 3-4 Times A Day As Directed 11)  Accu-Chek Softclix Lancets   Misc (Lancets) .... Test 3-4 Times A Day As Directed 12)  Humalog Kwikpen 100 Unit/ml  Soln (Insulin Lispro (Human)) .... Take As Directed According To Stoplight Scale- Up To A Total of 25 Units/day 13)   Lantus Solostar 100 Unit/ml Soln (Insulin Glargine) .... Inject 20 Units At Bedtime or As Directed  Allergies: No Known Drug Allergies  Past History:  Past Medical History: Last updated: 12/28/2007  1.  Renal artery stenosis, Chronic renal insufficiency, stage 4-solitary kidney, occluded stent in opposite kidney  2. Peripheral vascular disease.  3. Stage 4 chronic kidney disease.  4. Occluded left renal artery stent with renal artery stenosis.  5. Ischemic cardiomyopathy with an ejection fraction of 20%.  6. Coronary artery disease status post PTCA in May of 2005, stenting of an      intermittent branch of the circumflex and angioplasty of the diagonal      branch to the left anterior descending.  7. Hypertension.  8. Diabetes mellitus type 2.  9. Status post cerebrovascular accident.  10.Dementia not otherwise specified.  11.History of a retroperitoneal bleed.  12.History of anemia. Diverticulosis Hx of adenomatous colon polyps 05/2002  Family History: Last updated: 12/28/2007 Not pertinent since she has already been diagnosed with CAD, CVA No FH of Colon Cancer: Family History of Kidney Disease:Brother  Social History: Retired Alcohol use-no Denies smoking Patient gets regular exercise- walking Lives with husband and son; son manages medications  Review of Systems      See HPI General:  Denies chills, fever, malaise, and weakness. Eyes:  Denies blurring. CV:  Denies chest pain or discomfort and swelling of feet. Resp:  Denies cough and shortness of breath. GI:  Denies abdominal pain and change in bowel habits. GU:  Denies dysuria, urinary frequency, and urinary hesitancy. Psych:  Denies depression.  Physical Exam  General:  alert and cooperative to examination.   Head:  normocephalic and atraumatic.   Eyes:  vision grossly intact, pupils equal, pupils round, and pupils reactive to light.   Mouth:  pharynx pink and moist.   Neck:  supple and no masses.   Lungs:   normal respiratory effort, normal breath sounds, no crackles, and no wheezes.   Heart:  normal rate, regular rhythm, no murmur, no gallop, and no rub.   Abdomen:  soft, non-tender, and no distention.   Extremities:  No edema.  Neurologic:  alert, oriented to person and place and season. Not oriented to month or year. MMSE score: 14 (writes a sentance, does a fair clock drawing; unable to spell world backwards, recalls 0 of 3 items at 3 min, follow 2 of 3 steps in command). cranial nerves grossly intact.  cranial nerves II-XII intact, strength normal in all extremities, and sensation intact to light touch.   Skin:  turgor normal and no rashes.   Psych:  normally interactive, good eye contact, not anxious appearing, and not depressed appearing.    Diabetes Management Exam:    Foot Exam (  with socks and/or shoes not present):       Sensory-Monofilament:          Left foot: normal          Right foot: normal   Impression & Recommendations:  Problem # 1:  DEMENTIA, VASCULAR UNCOMPLICATED (ICD-290.40) Ms. Brake is exhibiting signs of progressive dementia (Alzheimer's vs. vascular). MMSE demonstrates deficits in orientation, attention, and recall. I spoke with her daughters at length about what to expect with progressive dementia with special attention to efforts to keep Ms. Kanan safe at home. Will send urine for culture to r/o UTI that might be causing delirium (initial diptick is negative for nitrites and LE). I also discussed the need for her to restart home medications, as her poorly controlled HTN can also have a negative impact on her mental status. At their request, we reviewed all of her current medications to reduce or eliminate anything that might not need or that might exacerbate her confusion. Ultimately, we agreed to stop hydralazine and isosorbide mononitrate and decrease simvastatin to 40mg . They seemed pleased with these changes and promised to communicate the necessity of her  restarting medications to the son who was not present for this visit. We also discussed the possibility of starting an acetylcholinesterase inhibitor in the future, although I think the benefit would probably be marginal. Furthermore, I did not want to start any other medications until it is demonstrated that she can adhere to current med regimen at home.   Problem # 2:  HYPERTENSION (ICD-401.9) Poorly controlled secondary to not taking medications other than enalapril. Discussed the importance of BP control and family agreed to restart medications as discussed above. Will check BMET today.   The following medications were removed from the medication list:    Hydralazine Hcl 50 Mg Tabs (Hydralazine hcl) .Marland Kitchen... Take 1 tablet by mouth once a day Her updated medication list for this problem includes:    Lasix 40 Mg Tabs (Furosemide) .Marland Kitchen... Take 1 tablet by mouth once a day    Enalapril Maleate 10 Mg Tabs (Enalapril maleate) .Marland Kitchen... Take 1 tablet by mouth once a day    Zebeta 5 Mg Tabs (Bisoprolol fumarate) .Marland Kitchen... Take 1 tablet by mouth once a day    Norvasc 10 Mg Tabs (Amlodipine besylate) .Marland Kitchen... Take one tablet daily  Orders: T-Basic Metabolic Panel [id number])  Problem # 3:  DIABETES MELLITUS, TYPE II (ICD-250.00) HbA1C improved at 7.6. No episodes of hypoglycemia per family. Continue current management.   Her updated medication list for this problem includes:    Enalapril Maleate 10 Mg Tabs (Enalapril maleate) .Marland Kitchen... Take 1 tablet by mouth once a day    Aspirin 81 Mg Tbec (Aspirin) .Marland Kitchen... Take 1 tablet by mouth once a day    Lantus 100 Unit/ml Soln (Insulin glargine) ..... Inject 15 sq units at bedtime    Humalog Kwikpen 100 Unit/ml Soln (Insulin lispro (human)) .Marland Kitchen... Take as directed according to stoplight scale- up to a total of 25 units/day    Lantus Solostar 100 Unit/ml Soln (Insulin glargine) ..... Inject 20 units at bedtime or as directed  Orders: T- Capillary Blood Glucose (82948) T-Hgb  A1C (in-house) ([id number])  Complete Medication List: 1)  Lasix 40 Mg Tabs (Furosemide) .... Take 1 tablet by mouth once a day 2)  Enalapril Maleate 10 Mg Tabs (Enalapril maleate) .... Take 1 tablet by mouth once a day 3)  Zebeta 5 Mg Tabs (Bisoprolol fumarate) .... Take 1 tablet by mouth once a  day 4)  Norvasc 10 Mg Tabs (Amlodipine besylate) .... Take one tablet daily 5)  Calcitriol 0.25 Mcg Caps (Calcitriol) .... Take 1 capsule by mouth once a day 6)  Simvastatin 40 Mg Tabs (Simvastatin) .... Take 1 tablet by mouth once a day 7)  Aspirin 81 Mg Tbec (Aspirin) .... Take 1 tablet by mouth once a day 8)  Lantus 100 Unit/ml Soln (Insulin glargine) .... Inject 15 sq units at bedtime 9)  Freestyle Lite Test Strp (Glucose blood) .... Use to test blood glucose 4x/day before meals 10)  Compact Test Strips Dc 51ct/ 16109  .... Test 3-4 times a day as directed 11)  Accu-chek Softclix Lancets Misc (Lancets) .... Test 3-4 times a day as directed 12)  Humalog Kwikpen 100 Unit/ml Soln (Insulin lispro (human)) .... Take as directed according to stoplight scale- up to a total of 25 units/day 13)  Lantus Solostar 100 Unit/ml Soln (Insulin glargine) .... Inject 20 units at bedtime or as directed  Patient Instructions: 1)  Please call to schedule follow-up appointment with Dr. Phillips Odor or Dr. Odis Luster in 2-6 weeks. 2)  Decrease simvastatin from 80mg  to 40mg  daily. 3)  Stop hydralazine and isosorbide mononitrate.  Prescriptions: SIMVASTATIN 40 MG TABS (SIMVASTATIN) Take 1 tablet by mouth once a day  #30 x 6   Entered and Authorized by:   Whitney Post MD   Signed by:   Whitney Post MD on 03/22/2010   Method used:   Electronically to        [facility] (retail)       [address]       [address]       [place], [place]  [address]       Ph: [phone]       Fax: [phone]   RxID:   [id number]    Orders Added: 1)  T- Capillary Blood Glucose [82948] 2)  T-Hgb A1C (in-house)  [83036QW] 3)  Est. Patient Level IV [44010] 4)  T-Basic Metabolic Panel [[id number]]   Process Orders Check Orders Results:     [facility]: Check successful Tests Sent for requisitioning (March 26, 2010 1:37 PM):     03/21/2010: [facility] -- T-Basic Metabolic Panel [id number] (signed)     Prevention & Chronic Care Immunizations   Influenza vaccine: Fluvax MCR  (11/30/2007)    Tetanus booster: Not documented    Pneumococcal vaccine: Not documented    H. zoster vaccine: Not documented  Colorectal Screening   Hemoccult: Not documented    Colonoscopy: Location:  [facility].    (01/15/2008)   Colonoscopy due: 01/2013  Other Screening   Pap smear: Not documented    Mammogram: No specific mammographic evidence of malignancy.    (12/08/2007)   Mammogram due: 12/2008    DXA bone density scan: Not documented   Smoking status: never  (03/21/2010)  Diabetes Mellitus   HgbA1C: 7.6  (03/21/2010)   Hemoglobin A1C due: 07/15/2007    Eye exam: Cataract.  referred to Dr. Vonna Kotyk for cataract surgery Mild non-proliferative diabetic retinopathy.  os & od, s/p PRP OD, no further retina treatment indicated at this point   (12/06/2008)   Eye exam due: 12/2009    Foot exam: yes  (03/21/2010)   High risk foot: Yes  (03/21/2010)   Foot care education: Done  (03/21/2010)   Foot exam due: 07/24/2007    Urine microalbumin/creatinine ratio: 10.3  (11/30/2007)    Diabetes flowsheet reviewed?: Yes  Progress toward A1C goal: Improved  Lipids   Total Cholesterol: 211  (04/17/2007)   LDL: 130  (04/17/2007)   LDL Direct: Not documented   HDL: 41  (04/17/2007)   Triglycerides: 200  (04/17/2007)    SGOT (AST): 19  (11/08/2008)   SGPT (ALT): 10  (11/08/2008)   Alkaline phosphatase: 82  (11/08/2008)   Total bilirubin: 0.6  (11/08/2008)    Lipid flowsheet reviewed?: Yes   Progress toward LDL goal: Unchanged  Hypertension    Last Blood Pressure: 192 / 97  (03/21/2010)   Serum creatinine: 1.92  (11/08/2008)   Serum potassium 4.0  (11/08/2008)    Hypertension flowsheet reviewed?: Yes   Progress toward BP goal: Deteriorated  Self-Management Support :   Personal Goals (by the next clinic visit) :     Personal A1C goal: 7  (03/21/2010)     Personal blood pressure goal: 130/80  (03/21/2010)     Personal LDL goal: 100  (03/21/2010)    Patient will work on the following items until the next clinic visit to reach self-care goals:     Medications and monitoring: check my blood sugar, check my blood pressure, bring all of my medications to every visit, weigh myself weekly, examine my feet every day  (03/21/2010)     Eating: drink diet soda or water instead of juice or soda, eat more vegetables, use fresh or frozen vegetables, eat fruit for snacks and desserts, limit or avoid alcohol  (03/21/2010)     Activity: take a 30 minute walk every day  (03/21/2010)    Diabetes self-management support: Written self-care plan, Education handout, Resources for patients handout  (03/21/2010)   Diabetes care plan printed   Diabetes education handout printed   Last diabetes self-management training by diabetes educator: 05/22/2007   Last medical nutrition therapy: 06/11/2007    Hypertension self-management support: Written self-care plan, Education handout, Resources for patients handout  (03/21/2010)   Hypertension self-care plan printed.   Hypertension education handout printed    Lipid self-management support: Written self-care plan, Education handout, Resources for patients handout  (03/21/2010)   Lipid self-care plan printed.   Lipid education handout printed      Resource handout printed.  Laboratory Results   Urine Tests  Date/Time Received: 03/21/10  3:40PM Date/Time Reported: same  Routine Urinalysis   Color: lt. yellow Appearance: Clear Glucose: negative   (Normal Range: Negative) Bilirubin: negative   (Normal  Range: Negative) Ketone: negative   (Normal Range: Negative) Spec. Gravity: <1.005   (Normal Range: 1.003-1.035) Blood: trace-lysed   (Normal Range: Negative) pH: 5.5   (Normal Range: 5.0-8.0) Protein: >=300   (Normal Range: Negative) Urobilinogen: 0.2   (Normal Range: 0-1) Nitrite: negative   (Normal Range: Negative) Leukocyte Esterace: negative   (Normal Range: Negative)     Blood Tests   Date/Time Received: March 21, 2010 2:50 PM Date/Time Reported: Alric Quan  March 21, 2010 2:50 PM    HGBA1C: 7.6%   (Normal Range: Non-Diabetic - 3-6%   Control Diabetic - 6-8%) CBG Random:: 178mg /dL       Last LDL:                                                 130 (04/17/2007 5:47:00 PM)        Diabetic Foot Exam Foot Inspection Is there a  history of a foot ulcer?              No Is there a foot ulcer now?              No Can the patient see the bottom of their feet?          Yes Are the shoes appropriate in style and fit?          Yes Is there swelling or an abnormal foot shape?          No Are the toenails long?                Yes Are the toenails thick?                Yes Are the toenails ingrown?              No Is there heavy callous build-up?              No Is there a claw toe deformity?                          No Is there elevated skin temperature?            No Is there limited ankle dorsiflexion?            No Is there foot or ankle muscle weakness?            No Do you have pain in calf while walking?           No      Diabetic Foot Care Education :Patient educated on appropriate care of diabetic feet.  Pulse Check          Right Foot          Left Foot Posterior Tibial:        2+            2+ Dorsalis Pedis:        2+            2+  High Risk Feet? Yes   10-g (5.07) Semmes-Weinstein Monofilament Test Performed by: Stanton Kidney Ditzler RN          Right Foot          Left Foot Visual Inspection     normal         normal Test Control      normal          normal Site 1         normal         normal Site 2         normal         normal Site 3         normal         normal Site 4         normal         normal Site 5         normal         normal Site 6         normal         normal Site 7         normal         normal Site 8         normal         normal Site 9  normal         normal Site 10         normal         normal  Impression      normal         normal   Process Orders Check Orders Results:     [facility]: Check successful Tests Sent for requisitioning (March 26, 2010 1:37 PM):     03/21/2010: [facility] -- T-Basic Metabolic Panel [phone] (signed)

## 2010-05-02 NOTE — Progress Notes (Signed)
Summary: call from daughter/ hla  Phone Note Call from Patient   Summary of Call: pt's daughter calls to say 1/25 pt was riding in car w/ son and pt had an episode where her L arm went completely limp, she was unable to use it, this lasted for a short period, nothing else changed, she was able to speak, no facial drooping, thought process was normal...aware of person, place and time...no loss of bladder/ bowel, was able to move other limbs as desired. after this episode she was back to normal but she and family are concerned. there are no appts available this day or tomorrow, it is suggested that pt be seen in ED for reassurance and possibly eval of meds and vs by a physician... pt's daughter states she had recently been taken off of her meds then they were started again, i don't see note of this only that pt's son had been "adjusting " her meds. i see that she has some dementia but due to the episode felt it would be best for pt to go to ED, daughter refuses at this time and states if an appt becomes available she would like for pt to be seen in clinic, it will take 20 to 40 mins for pt to arrive here Initial call taken by: Marin Roberts RN,  April 05, 2010 10:32 AM  Follow-up for Phone Call        Patient should be seen immediately in ED; this sounds like a possible TIA or stroke. Follow-up by: Margarito Liner MD,  April 05, 2010 11:37 AM  Additional Follow-up for Phone Call Additional follow up Details #1::        spoke w/ daughter, she states she will make arrangements Additional Follow-up by: Marin Roberts RN,  April 05, 2010 12:30 PM    Additional Follow-up for Phone Call Additional follow up Details #2::    I do not see any ED records. This is unfortunate. I will try to contact teh family so they no she needs urgent evaluation. We need to at least make sure her sencondary prevent things are covered for stroke prevention- she is very high risk. We should call and offer appointment here  in clinic with any provider. Follow-up by: Julaine Fusi  DO,  April 09, 2010 9:29 AM

## 2010-05-03 ENCOUNTER — Other Ambulatory Visit: Payer: Self-pay | Admitting: Internal Medicine

## 2010-05-03 DIAGNOSIS — E785 Hyperlipidemia, unspecified: Secondary | ICD-10-CM

## 2010-05-03 NOTE — Telephone Encounter (Signed)
Rx called in and pt asked to make appointment

## 2010-05-11 ENCOUNTER — Other Ambulatory Visit: Payer: Self-pay | Admitting: *Deleted

## 2010-05-11 DIAGNOSIS — E119 Type 2 diabetes mellitus without complications: Secondary | ICD-10-CM

## 2010-05-17 MED ORDER — GLUCOSE BLOOD VI STRP: ORAL_STRIP | Status: AC

## 2010-05-17 MED ORDER — ACCU-CHEK SOFTCLIX LANCETS MISC: Status: DC

## 2010-05-24 LAB — IRON AND TIBC
Saturation Ratios: 30 % (ref 20–55)
TIBC: 234 ug/dL — ABNORMAL LOW (ref 250–470)
UIBC: 184 ug/dL
UIBC: 164 ug/dL

## 2010-05-24 LAB — RENAL FUNCTION PANEL
Albumin: 3.8 g/dL (ref 3.5–5.2)
Albumin: 3.6 g/dL (ref 3.5–5.2)
BUN: 14 mg/dL (ref 6–23)
Calcium: 9.1 mg/dL (ref 8.4–10.5)
Chloride: 107 mEq/L (ref 96–112)
Creatinine, Ser: 1.43 mg/dL — ABNORMAL HIGH (ref 0.4–1.2)
Creatinine, Ser: 1.59 mg/dL — ABNORMAL HIGH (ref 0.4–1.2)
GFR calc Af Amer: 38 mL/min — ABNORMAL LOW (ref >60)
GFR calc non Af Amer: 32 mL/min — ABNORMAL LOW (ref >60)
Glucose, Bld: 133 mg/dL — ABNORMAL HIGH (ref 70–99)
Phosphorus: 3.5 mg/dL (ref 2.3–4.6)

## 2010-05-24 LAB — FERRITIN
Ferritin: 595 ng/mL — ABNORMAL HIGH (ref 10–291)
Ferritin: 571 ng/mL — ABNORMAL HIGH (ref 10–291)

## 2010-05-26 LAB — IRON AND TIBC
Saturation Ratios: 37 % (ref 20–55)
UIBC: 158 ug/dL

## 2010-05-26 LAB — RENAL FUNCTION PANEL
Albumin: 3.7 g/dL (ref 3.5–5.2)
BUN: 19 mg/dL (ref 6–23)
Calcium: 9 mg/dL (ref 8.4–10.5)
Creatinine, Ser: 1.61 mg/dL — ABNORMAL HIGH (ref 0.4–1.2)
GFR calc Af Amer: 38 mL/min — ABNORMAL LOW (ref >60)
GFR calc non Af Amer: 31 mL/min — ABNORMAL LOW (ref >60)

## 2010-05-26 LAB — FERRITIN: Ferritin: 574 ng/mL — ABNORMAL HIGH (ref 10–291)

## 2010-05-26 LAB — PTH, INTACT AND CALCIUM: Calcium, Total (PTH): 9.5 mg/dL (ref 8.4–10.5)

## 2010-05-27 LAB — IRON AND TIBC
Saturation Ratios: 32 % (ref 20–55)
TIBC: 259 ug/dL (ref 250–470)
TIBC: 259 ug/dL (ref 250–470)
UIBC: 170 ug/dL

## 2010-05-27 LAB — FERRITIN: Ferritin: 759 ng/mL — ABNORMAL HIGH (ref 10–291)

## 2010-05-27 LAB — POCT HEMOGLOBIN-HEMACUE: Hemoglobin: 11.6 g/dL — ABNORMAL LOW (ref 12.0–15.0)

## 2010-05-28 LAB — POCT HEMOGLOBIN-HEMACUE
Hemoglobin: 10.9 g/dL — ABNORMAL LOW (ref 12.0–15.0)
Hemoglobin: 10.4 g/dL — ABNORMAL LOW (ref 12.0–15.0)

## 2010-05-28 LAB — POCT I-STAT, CHEM 8
BUN: 26 mg/dL — ABNORMAL HIGH (ref 6–23)
Chloride: 99 mEq/L (ref 96–112)
Creatinine, Ser: 1.7 mg/dL — ABNORMAL HIGH (ref 0.4–1.2)
Sodium: 136 mEq/L (ref 135–145)
TCO2: 28 mmol/L (ref 0–100)

## 2010-05-29 LAB — IRON AND TIBC
Saturation Ratios: 32 % (ref 20–55)
TIBC: 237 ug/dL — ABNORMAL LOW (ref 250–470)
UIBC: 161 ug/dL

## 2010-05-30 LAB — FERRITIN: Ferritin: 528 ng/mL — ABNORMAL HIGH (ref 10–291)

## 2010-05-30 LAB — POCT HEMOGLOBIN-HEMACUE: Hemoglobin: 10.7 g/dL — ABNORMAL LOW (ref 12.0–15.0)

## 2010-05-30 LAB — IRON AND TIBC
Saturation Ratios: 34 % (ref 20–55)
TIBC: 221 ug/dL — ABNORMAL LOW (ref 250–470)
UIBC: 145 ug/dL

## 2010-06-03 LAB — FERRITIN: Ferritin: 686 ng/mL — ABNORMAL HIGH (ref 10–291)

## 2010-06-03 LAB — IRON AND TIBC
Iron: 89 ug/dL (ref 42–135)
TIBC: 269 ug/dL (ref 250–470)
UIBC: 180 ug/dL

## 2010-06-03 LAB — POCT HEMOGLOBIN-HEMACUE: Hemoglobin: 11.9 g/dL — ABNORMAL LOW (ref 12.0–15.0)

## 2010-06-06 ENCOUNTER — Ambulatory Visit: Payer: Self-pay | Admitting: Internal Medicine

## 2010-06-12 LAB — POCT HEMOGLOBIN-HEMACUE: Hemoglobin: 10.9 g/dL — ABNORMAL LOW (ref 12.0–15.0)

## 2010-06-12 LAB — FERRITIN: Ferritin: 607 ng/mL — ABNORMAL HIGH (ref 10–291)

## 2010-06-13 ENCOUNTER — Encounter: Payer: Self-pay | Admitting: Internal Medicine

## 2010-06-13 ENCOUNTER — Other Ambulatory Visit: Payer: Self-pay | Admitting: *Deleted

## 2010-06-13 ENCOUNTER — Ambulatory Visit (INDEPENDENT_AMBULATORY_CARE_PROVIDER_SITE_OTHER): Payer: MEDICARE | Admitting: Internal Medicine

## 2010-06-13 DIAGNOSIS — I10 Essential (primary) hypertension: Secondary | ICD-10-CM

## 2010-06-13 DIAGNOSIS — I2510 Atherosclerotic heart disease of native coronary artery without angina pectoris: Secondary | ICD-10-CM

## 2010-06-13 DIAGNOSIS — F0150 Vascular dementia without behavioral disturbance: Secondary | ICD-10-CM

## 2010-06-13 DIAGNOSIS — N184 Chronic kidney disease, stage 4 (severe): Secondary | ICD-10-CM

## 2010-06-13 DIAGNOSIS — E119 Type 2 diabetes mellitus without complications: Secondary | ICD-10-CM

## 2010-06-13 DIAGNOSIS — R5383 Other fatigue: Secondary | ICD-10-CM

## 2010-06-13 DIAGNOSIS — Z8679 Personal history of other diseases of the circulatory system: Secondary | ICD-10-CM

## 2010-06-13 LAB — GLUCOSE, CAPILLARY: Glucose-Capillary: 219 mg/dL — ABNORMAL HIGH (ref 70–99)

## 2010-06-13 LAB — POCT HEMOGLOBIN-HEMACUE
Hemoglobin: 11.8 g/dL — ABNORMAL LOW (ref 12.0–15.0)
Hemoglobin: 11.1 g/dL — ABNORMAL LOW (ref 12.0–15.0)

## 2010-06-13 LAB — IRON AND TIBC: Iron: 90 ug/dL (ref 42–135)

## 2010-06-13 MED ORDER — ASPIRIN 81 MG PO TBEC: 81.0000 mg | DELAYED_RELEASE_TABLET | Freq: Every day | ORAL | Status: DC

## 2010-06-13 MED ORDER — CALCITRIOL 0.25 MCG PO CAPS: 0.2500 ug | ORAL_CAPSULE | Freq: Every day | ORAL | Status: DC

## 2010-06-13 MED ORDER — ENALAPRIL MALEATE 10 MG PO TABS: 10.0000 mg | ORAL_TABLET | Freq: Every day | ORAL | Status: DC

## 2010-06-13 MED ORDER — SIMVASTATIN 40 MG PO TABS: 40.0000 mg | ORAL_TABLET | Freq: Every day | ORAL | Status: DC

## 2010-06-13 MED ORDER — FUROSEMIDE 40 MG PO TABS: 40.0000 mg | ORAL_TABLET | Freq: Every day | ORAL | Status: DC

## 2010-06-13 MED ORDER — INSULIN GLARGINE 100 UNIT/ML ~~LOC~~ SOLN: 20.0000 [IU] | Freq: Every day | SUBCUTANEOUS | Status: DC

## 2010-06-13 MED ORDER — AMLODIPINE BESYLATE 10 MG PO TABS: 10.0000 mg | ORAL_TABLET | Freq: Every day | ORAL | Status: DC

## 2010-06-13 MED ORDER — INSULIN LISPRO 100 UNIT/ML ~~LOC~~ SOLN: 1.0000 [IU] | Freq: Three times a day (TID) | SUBCUTANEOUS | Status: DC

## 2010-06-13 NOTE — Patient Instructions (Signed)
Come back to see [place] in 3 months. Earlier if needed. If she needs to go back on shots, I will call you. Continue to follow up with Dr. Eliott Nine.

## 2010-06-13 NOTE — Assessment & Plan Note (Signed)
Patient has symptoms of progressive dementia however they're not severe. At this time I feel that patient is not in need of any further intervention. This is mainly age related dementia along with vascular component.

## 2010-06-13 NOTE — Progress Notes (Signed)
  Subjective:    Patient ID: [name], female    DOB: [birth year], 77 y.o.   MRN: [id number]  HPI Patient is a 82 show female who has past medical history type 2 diabetes, hyperlipidemia, hypertension, microinfarction and coronary heart disease, she also has a history of chronic kidney disease. She has been progressively developing dementia. She is here today with her 2 daughters. She recently lost her husband.  She follows with Dr. Eliott Nine at the [facility] for her chronic kidney disease. She was seen about 2 weeks ago. Patient also lives with her son who takes care of her medications. I talked to her son over the phone for medical reconciliation. Patient does not have any particular problem at this time. She does complain of fatigue on several occasions from today.  Patient reports that she does not have any fevers nausea vomiting diarrhea constipation headaches blurred vision or urination problems.   Review of Systems  [all other systems reviewed and are negative       Objective:   Physical Exam    BP 120/88  Pulse 84  Temp 97.3 F (36.3 C)  Resp 18  Ht 5\' 2"  (1.575 m)  Wt 147 lb 9.6 oz (66.951 kg)  BMI 27.00 kg/m2  General Appearance:    Alert, cooperative, no distress, appears stated age  Head:    Normocephalic, without obvious abnormality, atraumatic  Eyes:    PERRL, conjunctiva/corneas clear, EOM's intact, fundi    benign, both eyes  Ears:    Normal TM's and external ear canals, both ears  Nose:   Nares normal, septum midline, mucosa normal, no drainage    or sinus tenderness  Throat:   Lips, mucosa, and tongue normal; teeth and gums normal  Neck:   Supple, symmetrical, trachea midline, no adenopathy;    thyroid:  no enlargement/tenderness/nodules; no carotid   bruit or JVD  Back:     Symmetric, no curvature, ROM normal, no CVA tenderness  Lungs:     Clear to auscultation bilaterally, respirations unlabored  Chest Wall:    No tenderness or  deformity   Heart:    Regular rate and rhythm, S1 and S2 normal, no murmur, rub   or gallop     Abdomen:     Soft, non-tender, bowel sounds active all four quadrants,    no masses, no organomegaly  Extremities:   Extremities normal, atraumatic, no cyanosis or edema  Pulses:   2+ and symmetric all extremities  Skin:   Skin color, texture, turgor normal, no rashes or lesions  Lymph nodes:   Cervical, supraclavicular, and axillary nodes normal  Neurologic:   CNII-XII intact, normal strength, sensation and reflexes    throughout      Assessment & Plan:

## 2010-06-13 NOTE — Assessment & Plan Note (Signed)
Patient denies any chest pain or chest pressure at this time. Given her dementia I doubt that she would be a candidate for any surgical intervention. continue her current regimen and aggressive control of her diabetes and hypertension

## 2010-06-13 NOTE — Assessment & Plan Note (Signed)
As mentioned earlier she is followed by Dr. Stephens Shire at the [facility]. I walked in the office note

## 2010-06-13 NOTE — Assessment & Plan Note (Signed)
Patient brought pressure is well controlled at the current regimen. I will continue her on Norvasc along with enalapril. Patient continues to follow with Dr. Stephens Shire on 6 monthly basis. Her routine lab work is from there. I will try to obtain a copy of the last blood work to assess her creatinine value.

## 2010-06-13 NOTE — Assessment & Plan Note (Addendum)
Patient is on 20 units of Lantus. I have reviewed her A1c today which is satisfactory. No changes were made in her medications.

## 2010-06-13 NOTE — Assessment & Plan Note (Signed)
Her fatigue is mainly multifactorial. She'll be started on vitamin D supplementation by Dr. Stephens Shire. Anemia could also be playing a role. She used to be on Aricept before. I will await for her blood work to arrive  from Dr. Stephens Shire to decide whether she needs to go back on Aricept. If her symptoms were to persist one can pursue TSH.

## 2010-06-13 NOTE — Assessment & Plan Note (Signed)
She is on aspirin and does not have any other symptoms no changes were made.

## 2010-06-13 NOTE — Telephone Encounter (Signed)
[facility] wants to know if Lantus can be change to Lantus Solostar and Humalog can be changed to Humalog Pen?  Thanks

## 2010-06-14 ENCOUNTER — Encounter: Payer: Self-pay | Admitting: Internal Medicine

## 2010-06-14 LAB — IRON AND TIBC
Saturation Ratios: 34 % (ref 20–55)
TIBC: 258 ug/dL (ref 250–470)

## 2010-06-14 LAB — POCT HEMOGLOBIN-HEMACUE: Hemoglobin: 12.4 g/dL (ref 12.0–15.0)

## 2010-06-15 LAB — IRON AND TIBC
Saturation Ratios: 22 % (ref 20–55)
UIBC: 194 ug/dL

## 2010-06-15 LAB — POCT HEMOGLOBIN-HEMACUE: Hemoglobin: 10.6 g/dL — ABNORMAL LOW (ref 12.0–15.0)

## 2010-06-16 LAB — POCT HEMOGLOBIN-HEMACUE: Hemoglobin: 9.8 g/dL — ABNORMAL LOW (ref 12.0–15.0)

## 2010-06-17 LAB — POCT HEMOGLOBIN-HEMACUE: Hemoglobin: 9.7 g/dL — ABNORMAL LOW (ref 12.0–15.0)

## 2010-06-19 MED ORDER — INSULIN GLARGINE 100 UNIT/ML ~~LOC~~ SOLN: 20.0000 [IU] | Freq: Every day | SUBCUTANEOUS | Status: DC

## 2010-06-19 MED ORDER — INSULIN LISPRO 100 UNIT/ML ~~LOC~~ SOLN: 1.0000 [IU] | Freq: Three times a day (TID) | SUBCUTANEOUS | Status: DC

## 2010-06-19 NOTE — Telephone Encounter (Signed)
Pharmacy wants to know if pt can be changed to Lantus Solostar and  Humalog Pen?

## 2010-06-21 ENCOUNTER — Other Ambulatory Visit: Payer: Self-pay | Admitting: *Deleted

## 2010-06-21 LAB — IRON AND TIBC
Saturation Ratios: 39 % (ref 20–55)
UIBC: 144 ug/dL

## 2010-06-21 NOTE — Telephone Encounter (Signed)
Pt request insulin pens for both lantus and Humalog.   Will you rewrite?

## 2010-06-22 MED ORDER — INSULIN LISPRO 100 UNIT/ML ~~LOC~~ SOLN: SUBCUTANEOUS | Status: DC

## 2010-06-22 MED ORDER — INSULIN GLARGINE 100 UNIT/ML ~~LOC~~ SOLN: 20.0000 [IU] | Freq: Every day | SUBCUTANEOUS | Status: DC

## 2010-06-22 MED ORDER — INSULIN PEN NEEDLE 31G X 8 MM MISC: Status: DC

## 2010-06-22 NOTE — Telephone Encounter (Signed)
Yes they can convert to Solostar pen at same dosing.

## 2010-06-25 LAB — POCT HEMOGLOBIN-HEMACUE: Hemoglobin: 11 g/dL — ABNORMAL LOW (ref 12.0–15.0)

## 2010-06-26 LAB — IRON AND TIBC: Iron: 75 ug/dL (ref 42–135)

## 2010-07-19 ENCOUNTER — Other Ambulatory Visit: Payer: Self-pay | Admitting: *Deleted

## 2010-07-19 DIAGNOSIS — E119 Type 2 diabetes mellitus without complications: Secondary | ICD-10-CM

## 2010-07-23 MED ORDER — GLUCOSE BLOOD VI STRP: ORAL_STRIP | Status: DC

## 2010-07-23 MED ORDER — ACCU-CHEK SOFTCLIX LANCETS MISC: Status: DC

## 2010-07-27 NOTE — Discharge Summary (Signed)
NAME:  FOLASADE, MOOTY                          ACCOUNT NO.:  [id number]   MEDICAL RECORD NO.:  [id number]                   PATIENT TYPE:  OIB   LOCATION:  6522                                 FACILITY:  [facility]   PHYSICIAN:  Salvadore Farber, M.D. [facility]         DATE OF BIRTH:  [birth year]   DATE OF ADMISSION:  11/15/2003  DATE OF DISCHARGE:  11/16/2003                                 DISCHARGE SUMMARY   PROCEDURES:  1.  Bilateral renal angiogram.  2.  Aborted left renal __________ angioplasty.   HOSPITAL COURSE:  Ms. Cillo is a 77 year old female with a history of  ischemic cardiomyopathy as well as a history of renal artery stenting in  2004.  Her serum creatinine has been increasing, and it was felt that she  needed reevaluation of her renal arteries.  She was brought in for this on  November 15, 2003.   She had a single normal right renal artery.  On the left, she had a single  renal artery, but the previously placed stent had severe in-stent restenosis  greater than 90%.  Dr. [name] was unable to cross the stenosis with a wire  without going through the stent strut.  The only lumen appeared to go  through the side of the stent, not in central lumen.  He is to discuss the  options with Dr. Eliott Nine including surgical bypass as there are no  percutaneous options.   The next day, albumin 36, creatinine 2.3.  An ultrasound is pending to rule  out a pseudoaneurysm.  If this is negative, she is considered stable for  discharge on November 16, 2003.   LABORATORY AND X-RAY DATA:  Sodium 132, potassium 3.6, chloride 98, CO2 27,  BUN 36, creatinine 2.3, glucose 337.  Hemoglobin 12.3, hematocrit 36.4, WBC  8.6, platelets 294.  Calcium 9.1.   CONDITION ON DISCHARGE:  Stable.   DISCHARGE DIAGNOSES:  1.  Peripheral vascular disease, greater than 90% in-stent restenosis in      right renal artery, single right renal artery patent.  2.  Ischemic cardiomyopathy with an ejection fraction of  20%.  3.  Congestive heart failure.  4.  History of non-Q-wave myocardial infarction in June 2004, medical      therapy.  5.  Hypertension.  6.  Diabetes.  7.  History of retroperitoneal bleed.  8.  History of ischemic cerebrovascular accident.  9.  Renal insufficiency.  10. History of anemia.  11. Abnormal Cardiolite with ischemic in two vascular territories in the mid      and distal anterolateral region extending to the apex and in the distal      inferior wall on October 26, 2003. Follow up in office.   DISCHARGE INSTRUCTIONS:  1.  Her activity level is to include no driving for two days and no      strenuous activity.  2.  She is to stick to a  low-fat, diabetic diet.  3.  She is to call the office for problems with the catheterization site.  4.  She is to follow up with Dr. [name] on October 10 at 11:15.  5.  She is to follow up with the P.A. for Dr. Antoine Poche on September 12 at      1:30.   DISCHARGE MEDICATIONS:  1.  Enalapril 5 mg b.i.d.  2.  NPH insulin as prior to admission.  3.  Hydralazine 50 mg t.i.d.  4.  Metoprolol 5 mg daily.  5.  K-Dur 20 mEq daily.  6.  Aspirin 325 mg daily.  7.  Procrit weekly.  8.  Zocor 40 mg daily.  9.  Isosorbide 60 mg daily.  10. Calcitriol 0.25 mcg daily.  11. Protonix mg daily.  12. Lasix 80 mg daily.      Theodore Demark, P.A. [facility]                  Salvadore Farber, M.D. [facility]    RB/MEDQ  D:  11/16/2003  T:  11/16/2003  Job:  [id number]   cc:   [facility]. Eliott Nine, M.D.  [address]  [place]  [place] [address]  Fax: [phone]   Alvester Morin, M.D.  [address]  [place]  [place] [address]  Fax: [phone]

## 2010-07-27 NOTE — Cardiovascular Report (Signed)
NAME:  [name], [name]                          ACCOUNT NO.:  [id number]   MEDICAL RECORD NO.:  [id number]                   PATIENT TYPE:  INP   LOCATION:  6529                                 FACILITY:  [facility]   PHYSICIAN:  Charlies Constable, M.D. [facility]              DATE OF BIRTH:  1931-08-23   DATE OF PROCEDURE:  03/31/2002  DATE OF DISCHARGE:                              CARDIAC CATHETERIZATION   PROCEDURE PERFORMED:  Percutaneous coronary intervention.   CLINICAL HISTORY:  The patient is 77 years old and had no prior history of  known heart disease and was admitted on January 11 with a non-ST elevation  infarction, which was complicated by congestive heart failure and  hypotension requiring intubation.  She was treated with heparin and  Integrilin and developed a GI emesis with her hematocrit falling from 35 to  24.  Her condition finally stabilized and she underwent catheterization by  Dr. [name] on March 24, 2002, and was found to have 95% stenosis in a  diagonal branch to the LAD, 60% narrowing in the mid LAD, 95% narrowing in a  midportion of a intermediate branch of the circumflex artery, 95% diffuse  stenosis in the distal vessel and 40% narrowing in the right coronary  artery.  Her electrocardiogram showed an ejection fraction of 35-45%.   DESCRIPTION OF PROCEDURE:  The procedure was performed via the right femoral  artery using an arterial sheath and 3.5 Voda, 7 [place] guiding catheter with  side holes.  We used a short luge wire.  The patient was given Angiomax  bolus in infusion and had been on Plavix.   We first approached the circumflex artery.  We crossed the lesion with a  luge wire without difficulty. We direct stented with a 2.25 x 8 mm Express  II stent and deployed this with one inflation of 12 atmospheres for 35  seconds.  Repeat diagnostic studies were then performed through the guiding  catheter.   We next approached the diagonal branch to the LAD. We crossed  the lesion  with a luge wire without difficulty. We used a 2.25 x 10 mm Cutting Balloon  and performed six inflations up to 8 atmospheres for 30 seconds.  Repeat  diagnostic studies were then performed through the guiding catheter. The  patient tolerated the procedure well and left the laboratory in satisfactory  condition.   RESULTS:  Initially the stenosis in the intermediate branch of the  circumflex artery was 95%.  Following stenting, this improved to 10%.   The stenosis in the diagonal branches were initially 95% and following  stenting this improved to less than 20%.   CONCLUSIONS:  1. Successful stenting of the intermediate branch to the circumflex artery     with improvement in percent diameter narrowing from 95% to less than 10%.  2. Successful Cutting Balloon angioplasty of the diagonal branch to the left  anterior descending with improvement in percent diameter narrowing from     95% to less than 20%.   DISPOSITION:  The patient was returned to the postangioplasty unit for  further observation.                                                    Charlies Constable, M.D. [facility]    BB/MEDQ  D:  03/31/2002  T:  04/01/2002  Job:  [id number]   cc:   Jonelle Sidle, M.D. [facility]  [address]  [place]  [place] [address]  Fax: [phone]   [facility]   Cardiopulmonary Laboratory

## 2010-07-27 NOTE — H&P (Signed)
NAME:  [name], [name]                          ACCOUNT NO.:  [id number]   MEDICAL RECORD NO.:  [id number]                   PATIENT TYPE:  INP   LOCATION:  2315                                 FACILITY:  [facility]   PHYSICIAN:  Jonelle Sidle, M.D. [facility]        DATE OF BIRTH:  September 03, 1931   DATE OF ADMISSION:  03/21/2002  DATE OF DISCHARGE:                                HISTORY & PHYSICAL   CHIEF COMPLAINT:  Shortness of breath.   HISTORY OF PRESENT ILLNESS:  The patient is a 77 year old female who  presents with a one-week history of shortness of breath.  She states that  she has been having intermittent shortness of breath and dyspnea on exertion  during this time.  However, on the day of admission, March 20, 2002, the  patient noted increasing shortness of breath and chest pain in her mid  chest, upper back, neck, and shoulders.  She told her husband that she was  hurting all over and asked him to massage her upper back and neck.  Late in  the evening, she called her daughter and told her that she was so short of  breath that she was going to go to the emergency room.  She went to the  [facility] Emergency Room where she was found to have respiratory  distress, hypotension with a blood pressure of 70/30, and a chest x-ray with  mild pulmonary edema.  Due to the respiratory distress, the patient received  succinylcholine and etomidate and was intubated.  Subsequently within the  next 30 minutes, her blood pressure rose to over 180/100 and a heart rate in  the 130-140 range.  [facility] was called to evaluate the patient  for cardiogenic shock.  Upon arrival, I found the patient to have an  elevated blood pressure.  However, this quickly dropped to 60/palpable and  the patient's heart rate dropped to the 70s.  She received dopamine at 10  mcg/min for a short period of time and 0.5 amp of atropine at a time when  the patient did not have a dopplerable brachial or  femoral pulse.  This only  lasted a minute or so and the patient's blood pressure came back up to at  least 90-100 mmHg systolic and she remained stable.  She was transferred to  the [facility] ICU on 2 mcg/min of dopamine with a  stable blood pressure.   PAST MEDICAL HISTORY:  1. Diabetes mellitus.  The patient is on insulin.  Very little is known     about her past medical history, even in discussing this with the family.  2. Hypertension.  Also, little is known regarding her cardiac history, but     she has never had an MI or other significant cardiac problems.   MEDICATIONS:  Insulin, unknown dose, taken b.i.d.   ALLERGIES:  No known drug allergies per the family.  SOCIAL HISTORY:  The patient lives in [place], [place], with her  husband.   FAMILY HISTORY:  She has a brother with end-stage renal disease and diabetes  and another brother with heart disease.   REVIEW OF SYSTEMS:  Other review of systems per the family is negative for  constitutional symptoms.  HEENT, skin, and other cardiovascular symptoms not  mentioned above, i.e. orthopnea, PND, edema, palpitations, syncope,  presyncope, claudication, wheezing, etc.  She has had some mild cough  recently.  Other review of systems is negative, including GU,  musculoskeletal, GI, neurologic, etc.   PHYSICAL EXAMINATION:  VITAL SIGNS:  The patient is afebrile with a pulse of  130, respirations 20, blood pressure 130/60, saturation 100% on 100% FIO2.  GENERAL APPEARANCE:  She is intubated and sedated. The patient is not  responsive.  HEENT:  Normocephalic and atraumatic.  PERRLA.  Nonicteric.  The oropharynx  and oral cavity are clear.  The patient does have emesis around her mouth  and on the bed from the time of intubation.  NECK:  Supple.  No bruits.  No detectable JVD.  Carotid pulses 2+.  No  lymphadenopathy.  CARDIOVASCULAR:  Tachycardic with a regular rate and rhythm.  No murmur,  rub, or  gallop.  No S3 or S4.  The PMI is normally placed and possibly  slightly hyperdynamic.  CHEST:  Clear to auscultation bilaterally.  She does have bilateral diffuse  rhonchi anteriorly.  SKIN:  Within normal limits.  ABDOMEN:  Soft, nontender, and nondistended.  No hepatosplenomegaly.  No  rebound.  No guarding.  EXTREMITIES:  No cyanosis, clubbing, or edema.  Pulses 2+ in the lower  extremities.  MUSCULOSKELETAL:  Within normal limits.  NEUROLOGIC:  Within normal limits as best detectable.   LABORATORY DATA:  The chest x-ray shows mild pulmonary edema, no effusions,  and normal heart size.  The EKG shows a sinus tachycardia at 127 with  diffuse inferolateral ST depression and T-wave inversion.  All intervals are  normal and no other old EKGs are available for comparison.  White count  10.2, hemoglobin 13.2, hematocrit 39.3, platelets 354.  Sodium 136,  potassium 4.0, chloride 105, bicarbonate 24, BUN 24, creatinine 1.6, glucose  397, bilirubin 1.0, AST 29, ALT 13, alkaline phosphatase 92, total protein  8.0, albumin 3.8.  CK 169, MB 3.5, troponin I positive at 0.08.  PT 12.3,  INR 0.9.  ABG 7.37, 29, and 114 on 100% FIO2.  The UA is positive for  greater than 1000 glucose and hylan casts.  Preliminary echocardiogram done  with a portable scanner shows an EF of 30% with hypokinetic anterior and  anterolateral segments and akinetic lateral wall and hypokinetic inferior  and inferolateral wall.  A Swan-Ganz catheter was placed without  complication.  Please see the procedure note.  This showed a PA pressure of  approximately 47/30, wedge pressure 40, cardiac output 2.86, and cardiac  index 1.6.   ASSESSMENT:  This 77 year old African American female presents with  hypotension, respiratory distress, and recent chest pain, shoulder pain,  neck pain.   IMPRESSION AND PLAN:  1. Cardiovascular.  Especially given the patient's echocardiogram and mildly    positive early troponin, I suspect  that this is a cardiac event of a non-     ST segment elevation myocardial infarction.  However, some circumflex     lesions may not demonstrate themselves on EKG.  We will stabilize the     patient and place  her on heparin, Integrilin, and aspirin and be very     careful with antihypertensive medications.  We will not give beta blocker     or ACE inhibitor at this time.  We will opt for early catheterization     depending on her clinical course.  We will continue to check cardiac     enzymes and use dopamine and fluids if she again drops her blood     pressure.  We will follow strict I&Os.  It does not appear that she is in     florid congestive heart failure at this time, however, her wedge was     slightly elevated.  I will only give her a mild dose of Lasix x 1 now.     We will leave the [facility] in for now and also place an A line.  Ativan     p.r.n. will be used for agitation.  A formal echocardiogram will be     repeated ASAP.  2. Respiratory status.  The chest x-ray does not show any infiltrates, but     we will keep the patient intubated and mildly sedated at this time.  3. Infectious disease.  Blood cultures x 2 are pending from [facility].  We will follow these carefully, but not start antibiotics at     this time.  4. Endocrine.  The patient has an elevated blood sugar on admission and we     will follow her with sliding scale insulin.  5. Gastrointestinal.  Will place the patient on Protonix 40 mg per NG daily     for GI prophylaxis and we will guaiac all stools.  The patient's     hemoglobin and hematocrit are stable.  6.     Neurologic.  We will follow this carefully.  The patient was mildly     hypotensive for a brief period of time.  We will follow her mental status     closely as she begins to wake up.   Further plans will be formulated by Jonelle Sidle, M.D.     Heloise Beecham, M.D. [facility]                Jonelle Sidle, M.D. [facility]    DWM/MEDQ  D:   03/21/2002  T:  03/21/2002  Job:  [id number]

## 2010-07-27 NOTE — Consult Note (Signed)
[name], [name]                          ACCOUNT NO.:  [id number]   MEDICAL RECORD NO.:  [id number]                   PATIENT TYPE:  INP   LOCATION:  2910                                 FACILITY:  [facility]   PHYSICIAN:  Speed Bing, M.D.               DATE OF BIRTH:  01/30/1932   DATE OF CONSULTATION:  08/06/2002  DATE OF DISCHARGE:                                   CONSULTATION   PRIMARY CARDIOLOGIST:  No single member of [facility] group identified.   HISTORY OF PRESENT ILLNESS:  Appreciate request to consult on this 77-year-  old woman admitted to the hospital 24 hours ago with back pain and leg  discomfort.  [name] first presented in January with somewhat similar  symptoms, but was also noted to be in pulmonary edema and shock.  She  required mechanical ventilation.  Cardiac enzymes were mildly elevated,  prompting cardiac catheterization which revealed moderately impaired LV  systolic function and critical stenosis of a large first diagonal and large  first marginal.  She underwent two-vessel stenting by Dr. Charlies Constable with  good result.  She was not seen in followup in the office after discharge.  She returned to her primary care physician yesterday with the above symptoms  and was admitted for evaluation.   MEDICATIONS ON ADMISSION:  1. Furosemide 40 mg daily.  2. Metoprolol 50 mg b.i.d.  3. Isosorbide mononitrate 60 mg daily.  4. Ramipril 2.5 mg daily.  5. Aspirin 325 mg daily.   PAST MEDICAL HISTORY:  1. Notable for anemia present at the time of her last admission.  This was     partially attributed to a retroperitoneal hematoma.  She underwent     evaluation including EGD in the hospital and subsequent colonoscopy,     apparently without significant pathology identified.  2. She does have a history of hypertension and diabetes.   ALLERGIES:  The patient has no known allergies.   HOSPITAL COURSE:  In the hospital symptoms have improved.  Cardiac  markers  are notable for normal CPK, normal CK-MB but persistently elevated troponins  of 0.4.  A CT scan of the chest is reportedly negative.  No report of this  study is available in the hospital computer or the chart.   FAMILY HISTORY:  Brother has end-stage renal disease and diabetes.  A second  brother has cardiac disease.   SOCIAL HISTORY:  Lives in [place] with her husband.  No history of  excessive alcohol use.   REVIEW OF SYSTEMS:  All systems negative except as noted above.   PHYSICAL EXAMINATION:  GENERAL:  Pleasant, well-appearing woman.  VITAL SIGNS:  Blood pressure 115/60, heart rate 70 and regular, respirations  16.  NECK:  No jugular venous distension, no carotid bruits.  Endocrine:  No  thyromegaly.  Hematopoietic:  No adenopathy.  HEENT:  Anicteric sclerae.  SKIN:  No significant  lesions.  LUNGS:  Clear.  CARDIAC:  Normal first and second heart sounds.  Fourth heart sound present.  ABDOMEN:  Soft, nontender.  No bruits.  Aortic pulsation not appreciated.  No organomegaly.  EXTREMITIES:  Distal pulses intact.  No edema.  NEUROMUSCULAR:  Symmetric strength and tone.  EKG:  Sinus bradycardia, left ventricular hypertrophy, shallow inferolateral  T wave inversion.  No prior tracing for comparison.  Report of previous  initial EKG noted inferolateral ST segment depression.   IMPRESSION:  [name] presents with some symptoms that are superficially  similar to other symptoms she had at the time of her initial admission.  She  probably did not suffer an acute myocardial infarction at that time, but had  progressive congestive heart failure due to an ischemic cardiomyopathy and  previous out-of-hospital myocardial infarction.  Her elevated markers during  her initial evaluation were probably related to congestive heart failure.  Of note, this time in the absence of dyspnea, which was a major symptom  initially, the normal blood pressure and apparent absence of  pulmonary edema  (no chest x-ray report available), the only finding that is particularly  concerning is her mild elevation of troponin.  A BNP level and chest x-ray  will be obtained to rule out congestive heart failure, which does not appear  to be present by examination.  We will obtain an additional set of markers  in the morning.  If she does in fact have congestive heart failure, she will  require additional diuresis.  This could account for her elevated troponin.  If not, the elevation could represent an acute event within recent weeks.  I  doubt that this elevation persists from her initial hospitalization.  Reversion towards normal would support that scenario.  Finally, she may have  a persistently elevated troponin of uncertain etiology.  If so, no further  assessment will be required during this hospital admission.  Every effort  should be made to see that she gets cardiology followup which did not occur  after her previous admission.  Dr. Charlies Constable would be an appropriate  cardiologist to see her as an outpatient.                                                [place] Bing, M.D.    RR/MEDQ  D:  08/06/2002  T:  08/07/2002  Job:  [id number]

## 2010-07-27 NOTE — Discharge Summary (Signed)
NAME:  [name], [name]                          ACCOUNT NO.:  [id number]   MEDICAL RECORD NO.:  [id number]                   PATIENT TYPE:  INP   LOCATION:  3703                                 FACILITY:  [facility]   PHYSICIAN:  Jonelle Sidle, M.D. [facility]        DATE OF BIRTH:  [birth year]   DATE OF ADMISSION:  03/21/2002  DATE OF DISCHARGE:  04/04/2002                           DISCHARGE SUMMARY - REFERRING   BRIEF HISTORY AND PHYSICAL:  This is a 77 year old female with no previous  history of coronary artery disease who was admitted to [facility]  on January 11 by Dr. [name] with a non-Q-wave MI.  She initially went to  [facility] where she was found to have respiratory  distress.  She had Lotensin and a chest x-ray consistent with mild pulmonary  edema.  The patient's blood pressure dropped into the 60 range systolically.  She received IV dopamine and atropine and was transported to [facility] fur further evaluation and treatment.   PAST MEDICAL HISTORY:  1. Significant for diabetes mellitus.  She has seen Dr. [name] in the past.     However, she was discharged from his practice.  2. She also has a history of hypertension.   ALLERGIES:  No known drug allergies.   MEDICATIONS PRIOR TO ADMISSION:  Insulin, dosage unknown.   SOCIAL HISTORY:  The patient lives in [place] with her husband.   FAMILY HISTORY:  The patient has a brother with end-stage renal disease and  diabetes, another brother with heart disease.   HOSPITAL COURSE:  As noted this patient was admitted to [facility]  for further evaluation of pulmonary edema, hypotension, and chest pain.  She  was treated with IV dopamine and atropine.  She required intubation shortly  after admission and echo at the bedside revealed an ejection fraction of 30%  with hypokinesis of the anterior and anterolateral walls.  A Swan catheter  was placed.  The patient was extubated  later on the day of admission.  She  did have some agitation and required further sedation.   The patient was noted to be anemic.  She did require transfusion during her  admission.  She was seen by Dr. [name] on March 26, 2002.  She ultimately  underwent upper endoscopy.  On April 01, 2002 this was found to be  essentially normal.  It was felt that she would need a colonoscopy in  approximately 4-6 weeks after her MI.  She was to follow up with Dr. [name]  after discharge.   On March 24, 2002 the patient underwent cardiac catheterization performed  by Dr. [name].  Please see Dr. [name] full dictated report for complete  details.  The patient had diffuse coronary disease.  Percutaneous  intervention versus coronary artery bypass graft surgery was recommended.  A  decision was later made to proceed with percutaneous intervention which was  performed on March 31, 2002 by Dr. Riley Kill.  At that time the patient had  a 95% circumflex lesion reduced to less than 10%.  She had a 95% diagonal  that was reduced to approximately 20%.  A stent was placed in the circumflex  artery.  The diagonal had a PTCA.   The patient had a 2-D echo performed January 15 that revealed an ejection  fraction of 35-45%.  There was mild to moderate MR as well as mild to  moderate TR.   As noted the patient has a history of diabetes.  She had been dismissed from  Dr. [name] practice.  The internal medicine service at [facility] was asked  to assist in the treatment of her diabetes.  She would be followed up on an  outpatient basis by internal medicine.   The patient developed a retroperitoneal hematoma following her percutaneous  intervention.  This delayed her discharge.  She was eventually felt to be  ready for discharge on April 04, 2002 in improved and stable condition.   Other history for this patient is significant for renal insufficiency and  elevated lipids.  As noted the patient did rule in for  a non-Q-wave MI.   LABORATORY DATA:  On the day of discharge a CBC revealed hemoglobin 11.2,  hematocrit 32.8, wbc's 11,400, platelets 335,000.  A chemistry profile on  April 02, 2002 revealed BUN 17, creatinine 1.9, potassium 3.6, glucose  109.  A d-dimer was elevated at 5.04 during her stay.  BNP on admission was  1440.  Lipid profile revealed a cholesterol 226, triglycerides 87, HDL was  9, LDL was 200.  A TSH was 2.726.   A myocardial nuclear medicine profusion scan was performed March 29, 2002  that showed fixed defects involving the anterior and inferior apical  regions.  These areas did not reprofuse on delayed images.  There was mild  dilatation of the left ventricle with a dilated cardiomyopathy felt to  possibly be present.  A chest x-ray on March 22, 2002 showed left base  atelectasis and/or infiltrate with worsening pulmonary edema and right  pleural effusion.  An EKG on January 22 showed normal sinus rhythm at the  rate of 78 beats per minute with T-wave inversions I, aVL, II, and V2  through V6.   DISCHARGE MEDICATIONS:  1. Plavix 75 mg daily.  2. Enteric-coated aspirin 325 mg daily.  3. Altace 5 mg daily.  4. Lopressor 50 mg b.i.d.  5. Zocor 80 mg at bedtime.  6. Imdur 60 mg daily.  7. Lasix 40 mg daily.  8. Nitroglycerin as needed.  9. Potassium chloride 20 mEq daily.  10.      Protonix 40 mg daily.  11.      The patient was told to continue her insulin as previously taken     which was 70/30 insulin 20 units a.m., 5 units in the afternoon.  She was     to check her blood sugars twice daily and record these and to follow up     with the internal medicine outpatient clinic.   ACTIVITY:  The patient was told to avoid any strenuous activity.   DIET:  She was to be on a low salt, low fat, diabetic diet.   FOLLOW UP:  She was to call the office if she had any problems with her groin site.  She was to follow up with Dr. [name] in approximately  four  weeks.  She  would need a colonoscopy 4-6 weeks after her MI.  She would  follow up with [facility] in approximately two weeks and follow up  with the internal medicine teaching service at [facility].  She was  to have a basic metabolic panel at her next office visit.   PROBLEM LIST AT TIME OF DISCHARGE:  1. Status post non-Q-wave myocardial infarction complicated by pulmonary     edema, requiring intubation.  2. PTCA of the circumflex and diagonal performed March 31, 2002 with a     stent placed in the circumflex.  3. Viability scan performed March 29, 2002.  Please see results as noted     above.  4. A 2-D echo performed March 25, 2002, EF 35-45% with mild to moderate MR     and TR.  5. Congestive heart failure in shock.  6. Diabetes mellitus.  7. Anemia requiring transfusion.  8. Upper endoscopy April 01, 2002 with no significant abnormalities found.     Colonoscopy to be scheduled on an outpatient basis.  9. Renal insufficiency.  10.      Elevated lipids.  11.      Retroperitoneal hematoma this admission.  12.      Elevated d-dimer.  13.      History of diabetes.  14.      History of hypertension.          Delton See, P.A. [facility]                  Jonelle Sidle, M.D. [facility]    DR/MEDQ  D:  04/04/2002  T:  04/04/2002  Job:  [id number]   cc:   [facility] [place], [place]  [address]  [place], [place]  [address]   Venita Lick. Pleas Koch., M.D. [facility]  [address]  [place]  [place] [address]  Fax: 1   Internal Medicine Teaching Service  [facility]
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
## 2010-07-27 NOTE — Op Note (Signed)
NAME:  [name], [name]                            ACCOUNT NO.:  [id number]   MEDICAL RECORD NO.:  [id number]                    PATIENT TYPE:  INP   LOCATION:                                       FACILITY:  [facility]   PHYSICIAN:  Veneda Melter, M.D.                   DATE OF BIRTH:   DATE OF PROCEDURE:  08/12/2002  DATE OF DISCHARGE:                                 OPERATIVE REPORT   RENAL INTERVENTION REPORT:   PROCEDURES PERFORMED:  1. Abdominal aortogram.  2. PT and stent placement, left renal artery.   DIAGNOSES:  1. Left renal artery stenosis.  2. Hypertension.  3. Renal insufficiency.  4. Peripheral vascular disease.   HISTORY:  This patient is a 77 year old black female who presents with  substernal chest discomfort.  The patient underwent cardiac catheterization  showing significant coronary artery disease that will be treated medically.  She has had poorly-controlled hypertension.  An abdominal aortogram at the  time of cardiac catheterization showed severe narrowing of 95% of the  proximal left renal artery.  Due to poorly-controlled hypertension and  chronic renal insufficiency and coronary artery disease with anginal  symptoms, she is referred for percutaneous intervention.   TECHNIQUE:  Informed consent was obtained.  The patient was brought to the  peripheral vascular lab and a 6-French sheath placed in the right femoral  artery.  The patient had been pretreated with aspirin and Plavix and was  given 4000 units of heparin intravenously.  A 5-French pigtail catheter was  advanced into the abdominal aorta, and an abdominal aortogram performed  using power injections of contrast.  This conformed the presence of high-  degree narrowing 95% of the proximal segment of the left renal artery with  sluggish flow distally.  There was significant pruning and atherosclerosis  of the small vasculature.  The right renal artery had mild irregularities.  A 6-French IM guide catheter  was then used to gauge the left renal artery.  Selective guide shots were obtained.  A 0.014 stabilizer wire was then  advanced into the distal section of the vessel.  A 3 x 15 Maverick balloon  was introduced and used to predilate the lesion at 8 atmospheres for 30  seconds.  Repeat angiography showed significant improvement in vessel lumen  and distal flow.  A 5 x 18 Genesis stent on a Slalom balloon was introduced,  carefully positioned in the proximal segment of the left renal artery, and  deployed at 6 atmospheres for 30 seconds.  The stent delivery system was  retracted slightly and used to further dilate the proximal and mid section  of the stent at 8 atmospheres for 30 seconds.  Repeat angiography showed an  excellent result and no residual stenosis, full correction of the lesion,  and no distal vessel damage.  The guide catheter was then removed and sheath  secured in position.  The patient tolerated the procedure well and was  transferred to the floor in stable condition.  Approximately 95 cc of  contrast was utilized.    FINAL RESULTS:  Successful PT and stent in the proximal segment of the left  renal artery with 95% narrowing to 0%, with placement of a 5 x 18 Genesis  stent.                                               Veneda Melter, M.D.    NG/MEDQ  D:  08/12/2002  T:  08/12/2002  Job:  [id number]   cc:   Talmage Coin, M.D.  [address]  [place]  [place] [address]  Fax: [phone]   Maryruth Eve, M.D.   Gertha Calkin, M.D.  Int. Med - Resident - [address]  [place], [place] [address]  Fax: [phone]

## 2010-07-27 NOTE — Discharge Summary (Signed)
[name]                          ACCOUNT NO.:  [id number]   MEDICAL RECORD NO.:  [id number]                   PATIENT TYPE:  INP   LOCATION:  3736                                 FACILITY:  [facility]   PHYSICIAN:  Alvester Morin, M.D.               DATE OF BIRTH:  [birth year]   DATE OF ADMISSION:  08/05/2002  DATE OF DISCHARGE:  08/13/2002                                 DISCHARGE SUMMARY   DISCHARGE DIAGNOSES:  1. Coronary artery disease, severe three-vessel disease, status post non-Q-     wave myocardial infarction in January 2004 and a non-Q-wave myocardial     infarction during this admission.  2. Diabetes mellitus.  3. Hypertension.  4. Renal artery stenosis, status post percutaneous angioplasty by Dr. Chales Abrahams.  5. Anemia, consistent with chronic disease.  6. Chronic renal insufficiency, probably diabetic and/or hypertensive     nephropathy.  7. Ischemic cardiomyopathy.  8. Peripheral vascular disease.  9. Status post ischemic stroke.   DISCHARGE MEDICATIONS:  1. Furosemide 40 mg p.o. daily.  2. Altace 5 mg p.o. daily.  3. Insulin 70/30 20 units in the morning and 12 units in the evening.  4. Metoprolol 50 mg p.o. b.i.d.  5. Potassium chloride 10 mEq p.o. daily.  6. Zocor 40 mg p.o. daily.  7. Plavix 75 mg p.o. daily for at least one month for the renal artery     stent.  8. Aspirin 81 mg p.o. daily.  9. Isosorbide mononitrate 30 mg p.o. daily.  10.      Nitroglycerin 0.4 mg sublingually p.r.n. for chest pain.   CONDITION AT DISCHARGE:  Patient was discharged in fair condition.  She was  able to ambulate without symptoms.   FOLLOW UP:  1. She was instructed to follow up in [facility] on August 25, 2002, at 2:30 p.m. with Dr. Lonia Blood.  2. To follow up with [facility], [facility] office, with Dr.     Rollene Rotunda on September 17, 2002, at 12 p.m.  3. She was also referred for cardiac rehab as an outpatient.   CONSULTATIONS  DURING ADMISSION:  We consulted Dr. Antoine Poche from [facility]  Cardiology.   PROCEDURES DURING ADMISSION:  1. Patient underwent an adenosine Cardiolite on Aug 08, 2002, which showed     reversible ischemia, ejection fraction of 31% with global hypokinesis,     and perfusion defect in the anterior and inferior walls.  2. On Aug 09, 2002, patient underwent cardiac catheterization by Dr. Rollene Rotunda, which showed basically LAD proximal 25%, mid 40%, distal 30%,     diagonal with 99% ostial and proximal 90% circumflex with 30 to 40% mid     stent with diffuse, 30% restenosis, right coronary artery, diffuse, 30%,     mid 50%, PDA proximally 80%, mid 99%, and the left renal  artery with 99%     stenosis.  3. On Aug 09, 2002, patient underwent lower extremity arterial Dopplers,     which showed an ABI of 0.55 on the right and ABI of 0.54 on the left and     low flux in the dorsalis pedis on the left and posterior tibial on the     left.  4. On August 12, 2002, patient underwent a left renal angioplasty with stent     placement by Dr. Veneda Melter with good results.   BRIEF ADMITTING HISTORY AND PHYSICAL WITH ADMISSION LABS:  [name]  is a 77 year old African-American female with a past medical history of  coronary artery disease with a myocardial infarction in January of 2004,  status post cardiac catheterization and stent placement, severe diabetes who  presented to [facility] with back pain starting about  one week ago, nonradiating.  She denied any chest pain, but she said that  the pain was similar to the pain that she experienced in January when she  had her infarct.  She was also complaining of pain while walking in her  lower extremities, and she said that she could not walk more than 100 feet  without pain in her lower extremities.   PAST MEDICAL HISTORY:  1. Significant for coronary artery disease.  2. Congestive heart failure, EF 30%.  3. Diabetes.   4. Hypertension.  5. Status post retroperitoneal bleed secondary to Plavix.   LABORATORY DATA:  Labs on admission, sodium 135, potassium 4.1, chloride  102, bicarb 27, BUN 19, creatinine 1.5, glucose 163.  White blood cell count  was 16.2, hemoglobin was 10.6, hematocrit was 30.9, and platelets 294.  She  had a CK of 1118, CK-MB 12.7, troponin-I 0.54.   HOSPITAL COURSE BY PROBLEM:  Problem 1.  Back pain.  Patient had pain  similar to one that she had in January of 2004 when she had her first MI.  She had minimal EKG changes during this admission; no ST elevation.  Her  troponins trended back to normal from 0.54, 0.41, 0.48, next set 0.31.  She  was placed on anticoagulation with heparin IV.  She was monitored 24 hours  per day, and IV nitroglycerin was given.  We consulted [facility],  and they performed a Cardiolite stress test on Aug 08, 2002, which showed  reversible ischemia in anterior and inferior walls.  Patient was taken back  to the cardiac cath lab where patient was found to have severe three-vessel  disease but no stent occlusion.  Dr. Juanda Chance, who performed her previous  stent placement, deemed her not a candidate for another stent placement, and  she is going to be managed medically with antiplatelet therapy, beta  blockers, ACE inhibitors, and isosorbide mononitrate.  Patient was  asymptomatic during her whole hospital stay, and at the time of discharge,  she was doing fair.  She was set up with the cardiac rehab program, and she  was also set up to follow in [facility] and with Dr.  Antoine Poche of cardiology.   Problem 2.  Renal artery stenosis.  During the cardiac catheterization,  given the fact that patient had persistent elevated values of her blood  pressure, she was found to have left renal artery stenosis.  Dr. Chales Abrahams from vascular intervention was consulted, and he performed an angioplasty and  stent placement in the left renal artery with  good results.  Patient  tolerated  the procedure well, and her blood pressure was _________ after  procedure.  Her creatinine stayed normal after the procedure.   Problem 3.  Hypertension.  On admission, patient's blood pressure values  were around 227/125, 190/100; we suspected medical noncompliance as well as  renal artery stenosis.  After starting her on medication in the hospital,  she had consistent values around 150 to 170/80s to 90s, and at the time of  discharge she had acceptable blood pressure control on a combination of  therapy, and we will continue that as an outpatient, and we will adjust  accordingly at followup.   Problem 4.  Chronic renal insufficiency, multifactorial.  We are going to  follow the creatinine and refer the patient to nephrology in case she has  worsening of her renal failure.   Problem 5.  Anemia.  A complete workup was done while the patient was in the  hospital; she had a normal B12, normal folate, iron was at the lower side of  normal, but she had a very high ferritin of 762.  We felt that her anemia  was due to chronic disease, renal insufficiency, but we are going to follow  with CBC and ferritin as an outpatient.   Problem 6.  Diabetes.  While in the hospital, the patient was kept on her  home regimen of insulin 70/30 as well as sliding-scale insulin.  She was  discharged on insulin 70/30, and the dose will be adjusted at followup by  her primary care physician.   LABORATORY DATA:  Labs at discharge, sodium 137, potassium 3.8, chloride  104, bicarb 28, BUN 22, creatinine 1.7, glucose 190, calcium 8.8.  White  blood cells 7.2, hemoglobin 9.6, hematocrit 26.7, platelets 242.     Lonia Blood, M.D.                          Alvester Morin, M.D.    SL/MEDQ  D:  08/13/2002  T:  08/14/2002  Job:  [id number]   cc:   [facility]   Rollene Rotunda, M.D.   Veneda Melter, M.D.

## 2010-07-27 NOTE — Op Note (Signed)
NAME:  [name], [name]                          ACCOUNT NO.:  [id number]   MEDICAL RECORD NO.:  [id number]                   PATIENT TYPE:  OIB   LOCATION:  6522                                 FACILITY:  [facility]   PHYSICIAN:  Salvadore Farber, M.D. [facility]         DATE OF BIRTH:  1931-08-27   DATE OF PROCEDURE:  11/15/2003  DATE OF DISCHARGE:                                 OPERATIVE REPORT   PROCEDURES:  1.  Selective bilateral renal angiography using gadolinium for contrast      agent.  2.  Unsuccessful left renal balloon angioplasty attempt.   INDICATIONS:  Ms. Eshleman is a 77 year old lady status post stenting of the  left renal artery by Dr. Chales Abrahams.  Her renal function has deteriorated from a  creatinine of approximately 1.6 to a creatinine of 2.2-2.5.  Ultrasound  suggested restenosis of the left renal artery stent, and she was referred  for angiography with an eye to repeat percutaneous revascularization.   PROCEDURAL TECHNIQUE:  Informed consent was obtained.  Under 1% lidocaine  local anesthesia, a 5 French sheath was placed in the right common femoral  artery using the modified Seldinger technique, initially using a C2 catheter  and gadolinium.  This demonstrated normal right renal artery but severe in-  stent restenosis of the left renal.  The left renal stent appeared to have  migrated proximally from its initial deployment.  The only evident contrast  flow was through the stent struts on the superior margin of the stent.  There appeared to be approximately 90% in-stent restenosis.  Though I could  see no lumen that did not go through a stent strut, I made multiple attempts  to try to advance a wire through the lumen of the stent.  Multiple guides  and wires were attempted.  Stabilizer wires were readily advanced through  the superior margin of the stent and out into the distal renal vessels.  However, no balloons were able to be advanced because all these went through  the  side of the stent rather than through its lumen.  Repeat angiography  suggested that the only lumen, in fact, went through the side of the stent.  Further attempts were therefore aborted.  The patient was then transferred  to the holding room in stable condition.   COMPLICATIONS:  None.   IODINATED CONTRAST:  Zero.   FINDINGS:  1.  Single normal renal artery on the right.  2.  Single left renal artery has greater than 90% in-stent restenosis.  The      only lumen appears to go through the side of the stent, not at central      lumen.  I was unsuccessful in attempts to advance a wire through the      central lumen of the stent.   IMPRESSION/RECOMMENDATION:  Will discuss options with the patient and Dr.  Eliott Nine, including surgical bypass.  There are no percutaneous  options for  revascularization.                                               Salvadore Farber, M.D. [facility]    WED/MEDQ  D:  11/15/2003  T:  11/16/2003  Job:  [id number]

## 2010-07-27 NOTE — Cardiovascular Report (Signed)
NAME:  [name], [name]                          ACCOUNT NO.:  [id number]   MEDICAL RECORD NO.:  [id number]                   PATIENT TYPE:  INP   LOCATION:  2315                                 FACILITY:  [facility]   PHYSICIAN:  Salvadore Farber, M.D. [facility]         DATE OF BIRTH:  09-28-1931   DATE OF PROCEDURE:  03/24/2002  DATE OF DISCHARGE:                              CARDIAC CATHETERIZATION   PROCEDURES:  Right and left heart catheter, coronary angiography.   INDICATIONS:  The patient is a 77 year old without prior history of coronary  disease.  She presented on January 11 with non-ST segment elevation  myocardial infarction, complicated by cardiogenic shock and congestive heart  failure requiring mechanical ventilation.  She was initially treated  medically with heparin and Integrilin.  She developed a upper GI bleed with  coffee-grounds emesis and hematocrit falling from 35 to 24.  She has  subsequently been transfused and hematocrit stable over the course of the  day.  She was then extubated and has remained hemodynamically stable for  more than 24 hours.  She is brought to the diagnostic cardiac  catheterization laboratory for diagnostic angiography and right heart  catheterization.   DIAGNOSTIC TECHNIQUE:  Informed consent was obtained. Under 1% lidocaine  local anesthesia, an 8 French sheath was placed in the right femoral vein  and a 6 French sheath in the right femoral artery using the modified  Seldinger technique. Right heart catheterization was performed using a  balloon-tipped catheter.  Cardiac output was measured by thermodilution.  A  6 French pigtail catheter was advanced in the left ventricle.  Pressures were measured.  Ventriculography was deferred due to renal  dysfunction. Coronary angiography was performed using JL4 and JR4 catheters.  The patient tolerated the procedure well and was transferred to the holding  room in stable condition.   COMPLICATIONS:   None.   HEMODYNAMICS:  RA 9, RV 52/8/10, PA 52/22/36.  PCW 20/20/19, aorta  141/78/103, LV 140/8/19, cardiac output 3.9, cardiac index 2.7.  Mean  transmitral gradient 5 mmHg with a calculated mitral valve area of 2.7 cm  sq.  No aortic stenosis.   CORONARY ANGIOGRAPHY:  1. Left main:  There is a very long left main artery without stenosis.  2. LAD:  The LAD is a large vessel giving rise to a single large diagonal     branch.  There is diffuse 40% disease at the proximal vessel.  There is a     60-70% stenosis just distal to the takeoff of the diagonal branch.  The     large first diagonal has a 90% stenosis in its proximal portion.  The     distal LAD has a 50% stenosis.  3. Ramus intermedius:  This is a very large vessel.  There is a 40% proximal     stenosis.  There is an 80% stenosis of the mid vessel.  4. Circumflex:  The circumflex is very small.  There is a 95% stenosis of     the distal vessel.  5. RCA:  The RCA is a large, dominant vessel.  It is diffusely and mildly     diseased.  There is a 30% stenosis of the proximal vessel.  There is a     40% stenosis of the distal vessel after the takeoff of the PDA.   IMPRESSION/RECOMMENDATIONS:  1. No aortic stenosis.  2. Mild mitral stenosis.  3. Multivessel coronary disease; there is severe stenoses of the large     diagonal #1, large ramus intermedius, and small distal circumflex. In     addition, there is at least a moderate stenosis of the LAD after the     first diagonal branch. In the setting of this multivessel disease with     diabetes with questionable severity at the LAD lesion, the decision for     percutaneous intervention versus coronary bypass grafting is complex. It     is further complicated by her recent gastrointestinal bleed and renal     dysfunction. We will obtain an echocardiogram to evaluate left     ventricular systolic function.  After this is known, we will discuss with     colleagues while the  gastrointestinal bleed is evaluated.                                                      Salvadore Farber, M.D. [facility]    WED/MEDQ  D:  03/24/2002  T:  03/25/2002  Job:  [id number]   cc:   Jonelle Sidle, M.D. [facility]  [address]  [place]  [place] [address]  Fax: 1

## 2010-07-27 NOTE — Cardiovascular Report (Signed)
[name], [name]                          ACCOUNT NO.:  [id number]   MEDICAL RECORD NO.:  [id number]                   PATIENT TYPE:  INP   LOCATION:  2910                                 FACILITY:  [facility]   PHYSICIAN:  Rollene Rotunda, M.D.                DATE OF BIRTH:  19-May-1931   DATE OF PROCEDURE:  08/09/2002  DATE OF DISCHARGE:                              CARDIAC CATHETERIZATION   PRIMARY CARE PHYSICIAN:  Talmage Coin, M.D.   PROCEDURE:  Left heart catheterization, coronary arteriography.   INDICATIONS:  Evaluate patient with unstable angina and a Cardiolite  suggesting mild anterior ischemia as well as mild inferior ischemia.  She  previously had stenting of her ramus intermediate vessel as well as cutting  balloon angioplasty of a diagonal.   DESCRIPTION OF PROCEDURE:  Left heart catheterization was performed via the  left femoral artery.  This side was chosen because the pulse was better.  I  initially the vein and inserted a 6 French sheath.  This was recognized, and  I was able to cannulate then the artery with a Smart needle.  A #6 French  arterial sheath was inserted.  Both veins were cannulated using an anterior  wall puncture and the modified Seldinger technique.  A preformed Judkins and  a pigtail catheter were utilized.  The patient did have a vagal episode  requiring treatment with Atropine.  She responded nicely to this.  She was  quite hypertensive in the holding area.   RESULTS:  PRESSURES:  LV 184/15, AO 187/105.   CORONARY ARTERIOGRAPHY:  1. The left main coronary artery had luminal irregularities.   1. The left anterior descending had proximal diffuse 25% stenosis followed     by a mid 40% stenosis of the large first diagonal and a long 30% after     the first diagonal.  There was atypical moderate diffuse disease.  The     first diagonal was large with an ostial 99% stenosis followed by proximal     90% stenosis.   1. The circumflex and the AV  groove was a somewhat small vessel.  There was     subtotal stenosis in the distal vessel feeding a posterior lateral     branch.  There was a very large ramus intermediate with long proximal and     mid 30-40% stenosis.  The mid stented area had diffuse in-stent 30%     renarrowing.  The right coronary artery was a dominant vessel.  It was     diffusely diseased with 30% stenosis.  There was a mid 50% focal lesion.     The PDA was long and diffusely diseased.  There was proximal 80% stenosis     followed by a mid 99% stenosis and severe diffuse distal disease.  There     was a small posterior lateral which was also subtotally stenosed.  1. Left ventricle:  The left ventricle was not injected secondary to renal     insufficiency.  An aortogram was obtained secondary to her difficulty to     control hypertension and renal insufficiency.  This demonstrated moderate     luminal irregularities in the distal AO proximal to the bifurcation.     There was 99% long stenosis in the left renal artery.  The right renal     artery was free of significant disease.   CONCLUSION:  Severe three vessel coronary disease with the bulk of the  disease being in small vessels, more distal vessels.  However, she does have  high grade proximal stenosis in a large mid diagonal.  This has previously  been treated with a cutting balloon.  I will review these films with my  interventional colleagues to discuss  percutaneous revascularization of this.  This would need to be done after  she has had adequate hydration and observation of her renal function.  I  think she should have consideration of percutaneous revascularization of the  left renal artery.                                               Rollene Rotunda, M.D.    JH/MEDQ  D:  08/09/2002  T:  08/09/2002  Job:  [id number]

## 2010-07-27 NOTE — Assessment & Plan Note (Signed)
[facility]                              CARDIOLOGY OFFICE NOTE   VEGAS, COFFIN                       MRN:          [id number]  DATE:12/12/2005                            DOB:          1931/05/15    PRIMARY CARE PHYSICIAN:  Alvester Morin, M.D.   REASON FOR PRESENTATION:  Evaluate patient with ischemic cardiomyopathy.   HISTORY OF PRESENT ILLNESS:  The patient is a pleasant 77 year old with  ischemic cardiomyopathy.  She presents for follow up.  She was hospitalized  a couple of days in September for cellulitis.  She has some chronic renal  insufficiency that has been stable.  There were no changes to her meds at  that time.  She states that her blood sugars have been difficult to control.  She denies any new shortness of breath.  She has had no PND or orthopnea.  She has had no chest discomfort, neck discomfort, arm discomfort, activity  induced nausea, vomiting, excessive diaphoresis, palpitations, presyncope,  or syncope.  She has had no PND or orthopnea.   PAST MEDICAL HISTORY:  Coronary artery disease (see the May 18, 2004, note  for details), cardiomyopathy (EF 30% in August improved to 53% most  recently), retroperitoneal bleed while on Plavix, renal insufficiency,  ischemic stroke, anemia, peripheral vascular disease (lower extremities with  bilateral brachial indices of 0.55 on the right and 0.54 on the left,  extending into the left renal artery), hypertension, diabetes mellitus.   ALLERGIES:  None.   MEDICATIONS:  1. Aspirin 81 mg daily.  2. Calcitriol.  3. Simvastatin 40 mg a day.  4. Bisoprolol 10 mg a day.  5. Furosemide 40 mg a day.  6. Multi-vitamin.  7. Kl0r-Con 20 mEq b.i.d.  8. Amlodipine 10 mg a day.  9. Isosorbide 40 mg t.i.d.  10.Hydralazine 50 mg t.i.d.  11.Enalapril 5 mg b.i.d.   REVIEW OF SYMPTOMS:  As stated in the HPI and negative for all other  systems.   PHYSICAL EXAMINATION:  GENERAL:  The  patient is in no distress.  VITAL SIGNS:  Blood pressure 136/72, heart rate 50 and regular, weight 149  pounds, body mass index 26.  HEENT:  Eyes unremarkable.  Pupils equal, round, reactive to light.  Fundi  not visualized.  Oral mucosa unremarkable.  NECK:  No jugular venous distention, wave form within normal limits, carotid  upstroke brisk and symmetric, no bruits, no thyromegaly.  LYMPHATICS:  No adenopathy.  LUNGS:  Clear to auscultation bilaterally.  BACK:  No costovertebral angle tenderness.  CHEST:  Unremarkable.  HEART:  PMI not displaced or sustained.  S1 and S2 within normal limits.  No  S3, S4, or murmurs.  ABDOMEN:  Flat, positive bowel sounds, normal in frequency and pitch, no  bruits, no rebound, no guarding, no midline pulsatile mass, no organomegaly.  SKIN:  No rashes.  EXTREMITIES:  2+ upper pulses, absent dorsalis pedes and posterior tibialis  bilaterally, no cyanosis, clubbing, and edema.  NEUROLOGICAL:  Grossly intact.   LABORATORY DATA:  EKG sinus bradycardia, rate 50, leftward axis, poor  anterior R wave progression, nonspecific T-wave flattening.   ASSESSMENT/PLAN:  1. Cardiomyopathy.  The patient is doing well with respect to this.  She      has class I symptoms.  No further cardiovascular testing is suggested.  2. Hypertension.  Blood pressure is well controlled and she will continue      the medications as listed.  3. Peripheral vascular disease.  She needs continued risk reduction.  I      will defer to her primary care doctor her lipid management.  She needs      an LDL less than 70 and HDL in the 50s.  4. Follow up.  I will see her back in six months or sooner if needed.            ______________________________  Rollene Rotunda, MD, [facility]     JH/MedQ  DD:  12/12/2005  DT:  12/13/2005  Job #:  [id number]

## 2010-08-03 ENCOUNTER — Encounter: Payer: Self-pay | Admitting: Internal Medicine

## 2010-08-13 ENCOUNTER — Ambulatory Visit (HOSPITAL_COMMUNITY)
Admission: RE | Admit: 2010-08-13 | Discharge: 2010-08-13 | Disposition: A | Discharge status: Home / Self Care | Payer: Medicare Other | Source: Ambulatory Visit | Attending: Internal Medicine | Admitting: Internal Medicine

## 2010-08-13 ENCOUNTER — Other Ambulatory Visit: Payer: Self-pay | Admitting: Internal Medicine

## 2010-08-13 ENCOUNTER — Encounter: Payer: Self-pay | Admitting: Internal Medicine

## 2010-08-13 ENCOUNTER — Ambulatory Visit (INDEPENDENT_AMBULATORY_CARE_PROVIDER_SITE_OTHER): Payer: Medicare Other | Admitting: Internal Medicine

## 2010-08-13 VITALS — BP 154/92 | HR 98 | Temp 98.2°F | Ht 62.0 in | Wt 154.0 lb

## 2010-08-13 DIAGNOSIS — E785 Hyperlipidemia, unspecified: Secondary | ICD-10-CM

## 2010-08-13 DIAGNOSIS — R29898 Other symptoms and signs involving the musculoskeletal system: Secondary | ICD-10-CM

## 2010-08-13 DIAGNOSIS — M6281 Muscle weakness (generalized): Secondary | ICD-10-CM | POA: Insufficient documentation

## 2010-08-13 DIAGNOSIS — E119 Type 2 diabetes mellitus without complications: Secondary | ICD-10-CM | POA: Insufficient documentation

## 2010-08-13 DIAGNOSIS — N184 Chronic kidney disease, stage 4 (severe): Secondary | ICD-10-CM

## 2010-08-13 DIAGNOSIS — F29 Unspecified psychosis not due to a substance or known physiological condition: Secondary | ICD-10-CM | POA: Insufficient documentation

## 2010-08-13 DIAGNOSIS — Z8673 Personal history of transient ischemic attack (TIA), and cerebral infarction without residual deficits: Secondary | ICD-10-CM | POA: Insufficient documentation

## 2010-08-13 DIAGNOSIS — M6250 Muscle wasting and atrophy, not elsewhere classified, unspecified site: Secondary | ICD-10-CM | POA: Insufficient documentation

## 2010-08-13 MED ORDER — INSULIN LISPRO 100 UNIT/ML ~~LOC~~ SOLN: SUBCUTANEOUS | Status: DC

## 2010-08-13 MED ORDER — INSULIN GLARGINE 100 UNIT/ML ~~LOC~~ SOLN: 25.0000 [IU] | Freq: Every day | SUBCUTANEOUS | Status: DC

## 2010-08-13 MED ORDER — PRAVASTATIN SODIUM 40 MG PO TABS: 40.0000 mg | ORAL_TABLET | Freq: Every evening | ORAL | Status: DC

## 2010-08-13 MED ORDER — CLOPIDOGREL BISULFATE 75 MG PO TABS: 75.0000 mg | ORAL_TABLET | Freq: Every day | ORAL | Status: DC

## 2010-08-13 NOTE — Progress Notes (Signed)
Addended byAllene Dillon on: 08/13/2010 04:53 PM   Modules accepted: Orders

## 2010-08-13 NOTE — Progress Notes (Signed)
  Subjective:    Patient ID: [name], female    DOB: May 19, 1931, 77 y.o.   MRN: [id number]  Extremity Weakness   Diabetes   Patient is a 56 show female who has past medical history type 2 diabetes, hyperlipidemia, hypertension, microinfarction and coronary heart disease, she also has a history of chronic kidney disease. She has been progressively developing dementia. She is here today with her 2 daughters. She recently lost her husband. She has develoved new weakness on the left side. Time of onset is not clear. She reports that her arm feels dead some times. She is not able to grip things well with it. The family also repots that her dementia is worsening. The son over the phone reprots that she gets lots of muscle pain with zocor. She also has several episodes of hypoglycemia as she is given 20 units of regular insulin through out the day for unclear reason in addition to 20 units of lantus each night.   She follows with Dr. Eliott Nine at the [facility] for her chronic kidney disease. She was seen about 2 weeks ago. Patient also lives with her son who takes care of her medications. I talked to her son over the phone for medical reconciliation. Patient reports that she does not have any fevers nausea vomiting diarrhea constipation headaches blurred vision or urination problems.   Review of Systems  Musculoskeletal: Positive for extremity weakness.  All other systems reviewed and are negative.       Objective:   Physical Exam     BP 154/92  Pulse 98  Temp(Src) 98.2 F (36.8 C) (Oral)  Ht 5\' 2"  (1.575 m)  Wt 154 lb (69.854 kg)  BMI 28.17 kg/m2  General Appearance:    Alert, cooperative, no distress, appears stated age  Head:    Normocephalic, without obvious abnormality, atraumatic  Eyes:    PERRL, conjunctiva/corneas clear, EOM's intact, fundi    benign, both eyes  Ears:    Normal TM's and external ear canals, both ears  Nose:   Nares normal, septum midline,  mucosa normal, no drainage    or sinus tenderness  Throat:   Lips, mucosa, and tongue normal; teeth and gums normal  Neck:   Supple, symmetrical, trachea midline, no adenopathy;    thyroid:  no enlargement/tenderness/nodules; no carotid   bruit or JVD  Back:     Symmetric, no curvature, ROM normal, no CVA tenderness  Lungs:     Clear to auscultation bilaterally, respirations unlabored  Chest Wall:    No tenderness or deformity   Heart:    Regular rate and rhythm, S1 and S2 normal, no murmur, rub   or gallop     Abdomen:     Soft, non-tender, bowel sounds active all four quadrants,    no masses, no organomegaly  Extremities:   Extremities normal, atraumatic, no cyanosis or edema  Pulses:   2+ and symmetric all extremities  Skin:   Skin color, texture, turgor normal, no rashes or lesions  Lymph nodes:   Cervical, supraclavicular, and axillary nodes normal  Neurologic:   CNII-XII intact, decreased strength left hand 4/5, distal>proximal, sensation and reflexes normal throughout. Could not examine gait, wheelchair bound.       Assessment & Plan:

## 2010-08-13 NOTE — Assessment & Plan Note (Signed)
Possible left basal ganglia infarct. I will change her aspirin to plavix.

## 2010-08-13 NOTE — Patient Instructions (Signed)
I will call you with the results of CT scan when available. The insulin dose has been changed. The cholesterol medication has been changed.

## 2010-08-17 ENCOUNTER — Telehealth: Payer: Self-pay | Admitting: *Deleted

## 2010-08-17 NOTE — Telephone Encounter (Signed)
Received call from Pharmacy asking for clarification of # of times pt is to check CBG and given Humalog. I said AC and HS.  If this is not what you wanted please let me know and I will call pharmacy. Also I changed amount from 10 ml to 15 ml as pens only come in the 15 ml amount.  Pharmacy # [phone] option 2   Pt ID # [id number]

## 2010-08-21 NOTE — Telephone Encounter (Signed)
Ok to change amount to 15 ml. Please call the pharmacy with approval. AC HS check is fine too.

## 2010-09-22 ENCOUNTER — Encounter: Payer: Self-pay | Admitting: Internal Medicine

## 2010-10-31 ENCOUNTER — Other Ambulatory Visit: Payer: Self-pay | Admitting: Internal Medicine

## 2010-11-29 LAB — CBC
Platelets: 255
RDW: 14.1
WBC: 5.4

## 2010-11-29 LAB — IRON AND TIBC
Iron: 98
Saturation Ratios: 41
UIBC: 142

## 2010-11-29 LAB — PTH, INTACT AND CALCIUM: Calcium, Total (PTH): 9.1

## 2010-11-30 LAB — CBC
HCT: 33.5 — ABNORMAL LOW
Hemoglobin: 11.2 — ABNORMAL LOW
MCV: 96.2
Platelets: 266
RDW: 14.3

## 2010-11-30 LAB — RENAL FUNCTION PANEL
BUN: 23
Chloride: 102
Glucose, Bld: 283 — ABNORMAL HIGH
Potassium: 4.4

## 2010-11-30 LAB — IRON AND TIBC
Saturation Ratios: 39
UIBC: 136

## 2010-12-03 LAB — RENAL FUNCTION PANEL
Albumin: 3.4 — ABNORMAL LOW
Chloride: 103
Creatinine, Ser: 1.82 — ABNORMAL HIGH
GFR calc Af Amer: 33 — ABNORMAL LOW
GFR calc non Af Amer: 27 — ABNORMAL LOW
Potassium: 4.1
Sodium: 134 — ABNORMAL LOW

## 2010-12-03 LAB — CBC
HCT: 34.3 — ABNORMAL LOW
Hemoglobin: 11.4 — ABNORMAL LOW
MCHC: 33.3
MCV: 96.1
RBC: 3.57 — ABNORMAL LOW

## 2010-12-03 LAB — FERRITIN: Ferritin: 674 — ABNORMAL HIGH (ref 10–291)

## 2010-12-04 ENCOUNTER — Other Ambulatory Visit: Payer: Self-pay | Admitting: Internal Medicine

## 2010-12-04 LAB — PTH, INTACT AND CALCIUM: PTH: 66.9

## 2010-12-04 LAB — CBC
Hemoglobin: 12.1
RBC: 3.86 — ABNORMAL LOW
RDW: 14.9

## 2010-12-04 LAB — IRON AND TIBC
Saturation Ratios: 40
TIBC: 246 — ABNORMAL LOW

## 2010-12-06 LAB — CBC
HCT: 34.3 — ABNORMAL LOW
Hemoglobin: 11.5 — ABNORMAL LOW
MCHC: 33.6
RDW: 13.9

## 2010-12-06 LAB — RENAL FUNCTION PANEL
CO2: 27
Chloride: 102
GFR calc Af Amer: 31 — ABNORMAL LOW
GFR calc non Af Amer: 26 — ABNORMAL LOW
Potassium: 3.8
Sodium: 136

## 2010-12-06 LAB — IRON AND TIBC: TIBC: 223 — ABNORMAL LOW

## 2010-12-07 LAB — RENAL FUNCTION PANEL
Albumin: 3.9
BUN: 34 — ABNORMAL HIGH
Creatinine, Ser: 2.04 — ABNORMAL HIGH
Glucose, Bld: 215 — ABNORMAL HIGH
Phosphorus: 3.8

## 2010-12-07 LAB — IRON AND TIBC
Saturation Ratios: 25
UIBC: 181

## 2010-12-07 LAB — PTH, INTACT AND CALCIUM
Calcium, Total (PTH): 9.7
PTH: 68

## 2010-12-07 LAB — CBC
HCT: 31.1 — ABNORMAL LOW
Hemoglobin: 10.5 — ABNORMAL LOW
RBC: 3.2 — ABNORMAL LOW
WBC: 6.6

## 2010-12-10 LAB — IRON AND TIBC
Iron: 97 ug/dL (ref 42–135)
TIBC: 250 ug/dL (ref 250–470)

## 2010-12-10 LAB — CBC
Hemoglobin: 10.9 g/dL — ABNORMAL LOW (ref 12.0–15.0)
MCHC: 33.7 g/dL (ref 30.0–36.0)
MCV: 97.7 fL (ref 78.0–100.0)
RDW: 12.4 % (ref 11.5–15.5)

## 2010-12-11 LAB — IRON AND TIBC
Iron: 54 ug/dL (ref 42–135)
Saturation Ratios: 24 % (ref 20–55)
TIBC: 230 ug/dL — ABNORMAL LOW (ref 250–470)
UIBC: 172 ug/dL

## 2010-12-11 LAB — GLUCOSE, CAPILLARY
Glucose-Capillary: 239 — ABNORMAL HIGH
Glucose-Capillary: 239 — ABNORMAL HIGH

## 2010-12-11 LAB — FERRITIN
Ferritin: 668 ng/mL — ABNORMAL HIGH (ref 10–291)
Ferritin: 616 ng/mL — ABNORMAL HIGH (ref 10–291)

## 2010-12-11 LAB — POCT HEMOGLOBIN-HEMACUE: Hemoglobin: 11 g/dL — ABNORMAL LOW (ref 12.0–15.0)

## 2010-12-12 NOTE — Progress Notes (Signed)
Addended by: [name] on: 12/12/2010 04:12 PM   Modules accepted: Orders

## 2010-12-13 LAB — GLUCOSE, CAPILLARY: Glucose-Capillary: 295 mg/dL — ABNORMAL HIGH (ref 70–99)

## 2010-12-13 LAB — POCT HEMOGLOBIN-HEMACUE: Hemoglobin: 12.1 g/dL (ref 12.0–15.0)

## 2010-12-14 LAB — CBC
HCT: 35.3 — ABNORMAL LOW
Hemoglobin: 11.9 — ABNORMAL LOW
RBC: 3.66 — ABNORMAL LOW

## 2010-12-14 LAB — RENAL FUNCTION PANEL
Albumin: 3.8
Calcium: 9.1
Creatinine, Ser: 1.84 — ABNORMAL HIGH
GFR calc Af Amer: 33 — ABNORMAL LOW
GFR calc non Af Amer: 27 — ABNORMAL LOW
Phosphorus: 4.2

## 2010-12-14 LAB — IRON AND TIBC
Saturation Ratios: 32
TIBC: 244 — ABNORMAL LOW
UIBC: 166

## 2010-12-14 LAB — FERRITIN: Ferritin: 679 — ABNORMAL HIGH (ref 10–291)

## 2010-12-18 LAB — RENAL FUNCTION PANEL
CO2: 25
Calcium: 9.3
Glucose, Bld: 348 — ABNORMAL HIGH
Sodium: 134 — ABNORMAL LOW

## 2010-12-18 LAB — CBC
HCT: 33.3 — ABNORMAL LOW
RDW: 14.2

## 2010-12-18 LAB — IRON AND TIBC
Iron: 96
Saturation Ratios: 41
TIBC: 237 — ABNORMAL LOW

## 2010-12-19 LAB — CBC
HCT: 32.9 — ABNORMAL LOW
MCV: 98
Platelets: 435 — ABNORMAL HIGH
WBC: 8.7

## 2010-12-19 LAB — RENAL FUNCTION PANEL
Albumin: 3.8
Calcium: 9.7
Chloride: 99
Creatinine, Ser: 2.15 — ABNORMAL HIGH
GFR calc Af Amer: 27 — ABNORMAL LOW
GFR calc non Af Amer: 23 — ABNORMAL LOW
Phosphorus: 4.3

## 2010-12-19 LAB — IRON AND TIBC: UIBC: 170

## 2010-12-19 LAB — FERRITIN: Ferritin: 883 — ABNORMAL HIGH (ref 10–291)

## 2010-12-19 LAB — PTH, INTACT AND CALCIUM: Calcium, Total (PTH): 9.2

## 2010-12-24 LAB — CBC
Hemoglobin: 9.6 — ABNORMAL LOW
MCHC: 33.8
Platelets: 301
RBC: 2.95 — ABNORMAL LOW
RDW: 12
WBC: 6.8

## 2010-12-24 LAB — IRON AND TIBC
Iron: 70
Saturation Ratios: 29
TIBC: 242 — ABNORMAL LOW

## 2010-12-24 LAB — RENAL FUNCTION PANEL
Chloride: 102
Glucose, Bld: 310 — ABNORMAL HIGH
Phosphorus: 3.2
Potassium: 3.9
Sodium: 133 — ABNORMAL LOW

## 2010-12-24 LAB — PTH, INTACT AND CALCIUM: PTH: 108.1 — ABNORMAL HIGH

## 2011-03-18 ENCOUNTER — Telehealth: Payer: Self-pay | Admitting: *Deleted

## 2011-03-18 NOTE — Telephone Encounter (Signed)
Relative calls and states pt's cbg's are remaining hi and pt c/o being extremely tired daily. Requests appt, 1/8 at 1030, dr Berlinda Last per chilonb.

## 2011-03-19 ENCOUNTER — Ambulatory Visit (INDEPENDENT_AMBULATORY_CARE_PROVIDER_SITE_OTHER): Payer: Medicare Other | Admitting: Internal Medicine

## 2011-03-19 ENCOUNTER — Encounter: Payer: Self-pay | Admitting: Internal Medicine

## 2011-03-19 ENCOUNTER — Ambulatory Visit (HOSPITAL_COMMUNITY)
Admission: RE | Admit: 2011-03-19 | Discharge: 2011-03-19 | Disposition: A | Discharge status: Home / Self Care | Payer: Medicare Other | Source: Ambulatory Visit | Attending: Internal Medicine | Admitting: Internal Medicine

## 2011-03-19 VITALS — BP 159/89 | HR 80 | Temp 96.9°F | Ht 63.0 in | Wt 152.6 lb

## 2011-03-19 DIAGNOSIS — I639 Cerebral infarction, unspecified: Secondary | ICD-10-CM

## 2011-03-19 DIAGNOSIS — I6350 Cerebral infarction due to unspecified occlusion or stenosis of unspecified cerebral artery: Secondary | ICD-10-CM

## 2011-03-19 DIAGNOSIS — Z0000 Encounter for general adult medical examination without abnormal findings: Secondary | ICD-10-CM

## 2011-03-19 DIAGNOSIS — E785 Hyperlipidemia, unspecified: Secondary | ICD-10-CM

## 2011-03-19 DIAGNOSIS — N184 Chronic kidney disease, stage 4 (severe): Secondary | ICD-10-CM

## 2011-03-19 DIAGNOSIS — F0390 Unspecified dementia without behavioral disturbance: Secondary | ICD-10-CM

## 2011-03-19 DIAGNOSIS — I509 Heart failure, unspecified: Secondary | ICD-10-CM

## 2011-03-19 DIAGNOSIS — I2510 Atherosclerotic heart disease of native coronary artery without angina pectoris: Secondary | ICD-10-CM

## 2011-03-19 DIAGNOSIS — E119 Type 2 diabetes mellitus without complications: Secondary | ICD-10-CM

## 2011-03-19 DIAGNOSIS — R5381 Other malaise: Secondary | ICD-10-CM

## 2011-03-19 DIAGNOSIS — R29898 Other symptoms and signs involving the musculoskeletal system: Secondary | ICD-10-CM

## 2011-03-19 DIAGNOSIS — I10 Essential (primary) hypertension: Secondary | ICD-10-CM

## 2011-03-19 DIAGNOSIS — R5383 Other fatigue: Secondary | ICD-10-CM

## 2011-03-19 DIAGNOSIS — R9431 Abnormal electrocardiogram [ECG] [EKG]: Secondary | ICD-10-CM | POA: Insufficient documentation

## 2011-03-19 LAB — LIPID PANEL
Cholesterol: 257 mg/dL — ABNORMAL HIGH (ref 0–200)
Total CHOL/HDL Ratio: 7.1 Ratio
Triglycerides: 274 mg/dL — ABNORMAL HIGH (ref <150)

## 2011-03-19 LAB — GLUCOSE, CAPILLARY: Glucose-Capillary: 182 mg/dL — ABNORMAL HIGH (ref 70–99)

## 2011-03-19 LAB — TSH: TSH: 1.71 u[IU]/mL (ref 0.350–4.500)

## 2011-03-19 LAB — CBC
MCH: 30.7 pg (ref 26.0–34.0)
MCHC: 33.1 g/dL (ref 30.0–36.0)
Platelets: 328 10*3/uL (ref 150–400)

## 2011-03-19 LAB — POCT GLYCOSYLATED HEMOGLOBIN (HGB A1C): Hemoglobin A1C: 8.1

## 2011-03-19 MED ORDER — INSULIN LISPRO 100 UNIT/ML ~~LOC~~ SOLN: SUBCUTANEOUS | Status: DC

## 2011-03-19 MED ORDER — PRAVASTATIN SODIUM 40 MG PO TABS: 40.0000 mg | ORAL_TABLET | Freq: Every evening | ORAL | Status: DC

## 2011-03-19 MED ORDER — INSULIN GLARGINE 100 UNIT/ML ~~LOC~~ SOLN: 20.0000 [IU] | Freq: Every day | SUBCUTANEOUS | Status: DC

## 2011-03-19 MED ORDER — CLOPIDOGREL BISULFATE 75 MG PO TABS: 75.0000 mg | ORAL_TABLET | Freq: Every day | ORAL | Status: DC

## 2011-03-19 MED ORDER — ACCU-CHEK COMPACT PLUS CARE KIT: 1.0000 | PACK | Freq: Three times a day (TID) | Status: DC

## 2011-03-19 NOTE — Assessment & Plan Note (Signed)
Currently on Norvasc and enalapril, BP today was 150 in clinic but rechecked at 130/90. Given the confusion with multiple medicines today, I elected not to change her blood pressure medicine or have anyone. This should be followed up at next visit and consider addition of another antihypertensive agent or increase of enalapril.

## 2011-03-19 NOTE — Assessment & Plan Note (Signed)
Patient currently on pravastatin 40 mg. Will recheck lipids today, should be followed up at next appointment.

## 2011-03-19 NOTE — Assessment & Plan Note (Signed)
Patient was supposed to be switched from aspirin to Plavix last June, but this change did not happen. I read ordered the Plavix, explained to the patient's daughter that she should be taking Plavix instead of aspirin. This switched should be followed up to ensure that it happened.

## 2011-03-19 NOTE — Assessment & Plan Note (Signed)
A1c today is 8.1. There's been some confusion about how to dose insulin properly. The patient was getting her Lantus dose with dinner, but not receiving corrected insulin at dinner. The patient's daughter brought a log book of her insulin values. The pre-lunch and pre-dinner values are elevated in the 300s and occasionally 400s. She is currently on a very sensitive insulin sliding scale and 25 units of Lantus. - Decrease Lantus to 20 units at bedtime - Increase sliding scale (see current med list). Make sure to give sliding scale insulin 3 times a day plus Lantus - Wrote prescription for new meter

## 2011-03-19 NOTE — Assessment & Plan Note (Signed)
Patient is followed by Dr. Eliott Nine at [facility], who has a note in her immediate section from July. The patient had been seen in the fall around November, but we do not have notes from that encounter. Her PMD, Dr.Schooler, may have received some communication to her mailbox regarding this more recent visit

## 2011-03-19 NOTE — Progress Notes (Signed)
Subjective:     Patient ID: [name], female   DOB: Jul 27, 1931, 78 y.o.   MRN: [id number]  HPI Patient is a 78 year old woman with a history of Patient Active Problem List  Diagnoses  . DIABETES MELLITUS, TYPE II  . HYPERLIPIDEMIA  . HYPERTENSION  . CORONARY ARTERY DISEASE  . CONGESTIVE HEART FAILURE  . RENAL ARTERY STENOSIS  . PERIPHERAL VASCULAR DISEASE  . GERD  . KIDNEY DISEASE, CHRONIC, STAGE IV   Presents for routine followup. This patient was last seen in clinic in June of 2012. The patient has many active medical problems, and needs to be followed up more frequently than every 6 months. Express these concerns to the family, and advised the patient to return in 2 months for followup. Please see individual session and plans for details of problems addressed at this visit.   Review of Systems Please see individual problems    Objective:   Physical Exam GEN: NAD.  Alert and oriented to name and place.  Pleasant, conversant, and cooperative to exam. RESP:  CTAB, no w/r/r CARDIOVASCULAR: reg rate, irreg rhythm, s1/s2, no murmurs ABDOMEN: soft, NT/ND, NABS EXT: warm and dry. No edema in b/l LE     Assessment:         Plan:

## 2011-03-19 NOTE — Assessment & Plan Note (Signed)
Patient continues to deny any chest pain, status post stents in 2004. Rhythm was irregular today in the office so I got an EKG that showed normal sinus rhythm with occasional PVCs. No A. fib.

## 2011-03-19 NOTE — Progress Notes (Signed)
I discussed patient with resident Dr. Wildman-Tobriner, and I agree with the plans as outlined in his note. 

## 2011-03-19 NOTE — Assessment & Plan Note (Addendum)
Patient's daughter describes worsening memory for many years, but especially over the past year. The patient carries a loose diagnosis of multi-infarct dementia secondary to CT scan showing multiple old infarcts. I did not perform an MMSE due to time constraints, but one could be performed at future visits. The daughter describes the patient not knowing where she is going (uses going to this appointment as an example), occasionally mixing up her and her sister (2 of the patient's 8 children), and generally exhibiting poor memory. Patient has not been formally evaluated for dementia, so we will proceed with TSH and B12. I do not think patient is suffering from pseudodementia secondary to depression. MRI should be considered in the future as well as MMSE to continue workup for dementia. The patient had been on Aricept at one point, but it was discontinued for unclear reasons, possibly secondary to her chronic kidney disease. It is difficult without complete [facility].

## 2011-03-19 NOTE — Assessment & Plan Note (Signed)
Patient has been complaining of some shortness of breath, though very nonspecific. Last echo psychosis from 2005, so I reordered an echo. This should be followed up at next visit. She is taking Lasix. No peripheral edema or crackles on lung exam.

## 2011-03-19 NOTE — Assessment & Plan Note (Signed)
Patient has been complaining of nonspecific fatigue for quite some time, with comments in prior records from as much as 6 months ago. She does have chronic anemia, and had been on Aranesp shots with her nephrologist, but discontinued secondary to poor consistency and compliance. Today I will repeat a CBC, TSH, BMP for routine labs as well as looking into this issue. These should be followed up at her next visit.

## 2011-03-20 ENCOUNTER — Other Ambulatory Visit: Payer: Self-pay | Admitting: Internal Medicine

## 2011-03-20 ENCOUNTER — Telehealth: Payer: Self-pay | Admitting: Internal Medicine

## 2011-03-20 LAB — BASIC METABOLIC PANEL WITH GFR
Calcium: 9.7 mg/dL (ref 8.4–10.5)
Creat: 1.94 mg/dL — ABNORMAL HIGH (ref 0.50–1.10)
GFR, Est African American: 28 mL/min — ABNORMAL LOW
GFR, Est Non African American: 24 mL/min — ABNORMAL LOW
Sodium: 134 mEq/L — ABNORMAL LOW (ref 135–145)

## 2011-03-20 MED ORDER — ATORVASTATIN CALCIUM 40 MG PO TABS: 40.0000 mg | ORAL_TABLET | Freq: Every day | ORAL | Status: DC

## 2011-03-20 MED ORDER — PRAVASTATIN SODIUM 40 MG PO TABS: 40.0000 mg | ORAL_TABLET | Freq: Every evening | ORAL | Status: DC

## 2011-03-20 NOTE — Telephone Encounter (Signed)
Saw this patient yesterday with her daughters present in the room. Spoke over the phone this morning with patient's son, who helps give her her medicines. There was substantial confusion regarding medicines that she had been taking and it was supposed to be taking. The patient was supposed to be taking Plavix since June, but had not been. I had refilled it yesterday and instructed her to start it, but he was still confused about why. Also, the patient was supposedly taking pravastatin, and with her poor lipid panel today, I had changed her to atorvastatin. In fact, the patient had not been taking pravastatin at all, so we will start pravastatin 40 and recheck her lipids at next visit. I am not convinced that the patient is taking all her medicines properly, as exemplified by her improper insulin administration (the son was giving it) that was documented fully in my clinic note from yesterday. As I said, this patient needs to be followed up more closely and on a more regular basis with our clinic.

## 2011-04-02 ENCOUNTER — Ambulatory Visit (HOSPITAL_COMMUNITY)
Admission: RE | Admit: 2011-04-02 | Discharge: 2011-04-02 | Disposition: A | Discharge status: Home / Self Care | Payer: Medicare Other | Source: Ambulatory Visit | Attending: Internal Medicine | Admitting: Internal Medicine

## 2011-04-02 DIAGNOSIS — I059 Rheumatic mitral valve disease, unspecified: Secondary | ICD-10-CM | POA: Insufficient documentation

## 2011-04-02 DIAGNOSIS — I509 Heart failure, unspecified: Secondary | ICD-10-CM | POA: Insufficient documentation

## 2011-04-02 DIAGNOSIS — I10 Essential (primary) hypertension: Secondary | ICD-10-CM | POA: Insufficient documentation

## 2011-04-02 DIAGNOSIS — E119 Type 2 diabetes mellitus without complications: Secondary | ICD-10-CM | POA: Insufficient documentation

## 2011-04-30 ENCOUNTER — Other Ambulatory Visit: Payer: Self-pay | Admitting: Internal Medicine

## 2011-04-30 DIAGNOSIS — E119 Type 2 diabetes mellitus without complications: Secondary | ICD-10-CM

## 2011-05-01 MED ORDER — ACCU-CHEK SOFTCLIX LANCETS MISC: Status: DC

## 2011-05-10 ENCOUNTER — Encounter: Payer: Medicare Other | Admitting: Internal Medicine

## 2011-05-24 ENCOUNTER — Emergency Department (HOSPITAL_COMMUNITY)
Admission: EM | Admit: 2011-05-24 | Discharge: 2011-05-25 | Disposition: A | Discharge status: Home / Self Care | Payer: Medicare Other | Attending: Emergency Medicine | Admitting: Emergency Medicine

## 2011-05-24 ENCOUNTER — Other Ambulatory Visit: Payer: Self-pay

## 2011-05-24 ENCOUNTER — Ambulatory Visit (INDEPENDENT_AMBULATORY_CARE_PROVIDER_SITE_OTHER): Payer: Medicare Other | Admitting: Internal Medicine

## 2011-05-24 ENCOUNTER — Emergency Department (HOSPITAL_COMMUNITY): Payer: Medicare Other

## 2011-05-24 ENCOUNTER — Encounter (HOSPITAL_COMMUNITY): Payer: Self-pay | Admitting: *Deleted

## 2011-05-24 ENCOUNTER — Encounter: Payer: Self-pay | Admitting: Internal Medicine

## 2011-05-24 VITALS — BP 210/106 | HR 92 | Temp 97.5°F | Ht 63.0 in | Wt 150.9 lb

## 2011-05-24 DIAGNOSIS — N184 Chronic kidney disease, stage 4 (severe): Secondary | ICD-10-CM | POA: Insufficient documentation

## 2011-05-24 DIAGNOSIS — I10 Essential (primary) hypertension: Secondary | ICD-10-CM

## 2011-05-24 DIAGNOSIS — I129 Hypertensive chronic kidney disease with stage 1 through stage 4 chronic kidney disease, or unspecified chronic kidney disease: Secondary | ICD-10-CM | POA: Insufficient documentation

## 2011-05-24 DIAGNOSIS — F0390 Unspecified dementia without behavioral disturbance: Secondary | ICD-10-CM

## 2011-05-24 DIAGNOSIS — E119 Type 2 diabetes mellitus without complications: Secondary | ICD-10-CM | POA: Insufficient documentation

## 2011-05-24 DIAGNOSIS — I160 Hypertensive urgency: Secondary | ICD-10-CM | POA: Insufficient documentation

## 2011-05-24 DIAGNOSIS — I2510 Atherosclerotic heart disease of native coronary artery without angina pectoris: Secondary | ICD-10-CM | POA: Insufficient documentation

## 2011-05-24 DIAGNOSIS — E785 Hyperlipidemia, unspecified: Secondary | ICD-10-CM | POA: Insufficient documentation

## 2011-05-24 DIAGNOSIS — Z79899 Other long term (current) drug therapy: Secondary | ICD-10-CM | POA: Insufficient documentation

## 2011-05-24 LAB — URINE MICROSCOPIC-ADD ON

## 2011-05-24 LAB — URINALYSIS, ROUTINE W REFLEX MICROSCOPIC
Bilirubin Urine: NEGATIVE
Glucose, UA: NEGATIVE mg/dL
Hgb urine dipstick: NEGATIVE
Ketones, ur: NEGATIVE mg/dL
Leukocytes, UA: NEGATIVE
Nitrite: NEGATIVE
Protein, ur: 30 mg/dL — AB
Specific Gravity, Urine: 1.008 (ref 1.005–1.030)
Urobilinogen, UA: 0.2 mg/dL (ref 0.0–1.0)
pH: 6.5 (ref 5.0–8.0)

## 2011-05-24 LAB — POCT I-STAT TROPONIN I

## 2011-05-24 LAB — BASIC METABOLIC PANEL
BUN: 26 mg/dL — ABNORMAL HIGH (ref 6–23)
CO2: 27 mEq/L (ref 19–32)
Calcium: 9.7 mg/dL (ref 8.4–10.5)
Chloride: 100 mEq/L (ref 96–112)
Creatinine, Ser: 1.71 mg/dL — ABNORMAL HIGH (ref 0.50–1.10)
GFR calc Af Amer: 32 mL/min — ABNORMAL LOW (ref >90)
GFR calc non Af Amer: 28 mL/min — ABNORMAL LOW (ref >90)
Glucose, Bld: 166 mg/dL — ABNORMAL HIGH (ref 70–99)
Potassium: 3.7 mEq/L (ref 3.5–5.1)
Sodium: 138 mEq/L (ref 135–145)

## 2011-05-24 LAB — DIFFERENTIAL
Basophils Absolute: 0 10*3/uL (ref 0.0–0.1)
Basophils Relative: 0 % (ref 0–1)
Eosinophils Absolute: 0.1 10*3/uL (ref 0.0–0.7)
Eosinophils Relative: 2 % (ref 0–5)
Lymphocytes Relative: 34 % (ref 12–46)
Lymphs Abs: 2.9 10*3/uL (ref 0.7–4.0)
Monocytes Absolute: 0.5 10*3/uL (ref 0.1–1.0)
Monocytes Relative: 5 % (ref 3–12)
Neutro Abs: 5.1 10*3/uL (ref 1.7–7.7)
Neutrophils Relative %: 59 % (ref 43–77)

## 2011-05-24 LAB — CBC
HCT: 29.8 % — ABNORMAL LOW (ref 36.0–46.0)
Hemoglobin: 10.2 g/dL — ABNORMAL LOW (ref 12.0–15.0)
MCH: 31.4 pg (ref 26.0–34.0)
MCHC: 34.2 g/dL (ref 30.0–36.0)
MCV: 91.7 fL (ref 78.0–100.0)
Platelets: 298 10*3/uL (ref 150–400)
RBC: 3.25 MIL/uL — ABNORMAL LOW (ref 3.87–5.11)
RDW: 11.9 % (ref 11.5–15.5)
WBC: 8.6 10*3/uL (ref 4.0–10.5)

## 2011-05-24 LAB — PROTIME-INR
INR: 0.91 (ref 0.00–1.49)
Prothrombin Time: 12.4 seconds (ref 11.6–15.2)

## 2011-05-24 LAB — APTT: aPTT: 31 seconds (ref 24–37)

## 2011-05-24 LAB — GLUCOSE, CAPILLARY: Glucose-Capillary: 154 mg/dL — ABNORMAL HIGH (ref 70–99)

## 2011-05-24 NOTE — ED Notes (Signed)
The pt was sent here from the internal med clinic.  She was there for a check -up and they sent her here  With high bp

## 2011-05-24 NOTE — Progress Notes (Signed)
Subjective:     Patient ID: [name], female   DOB: [birth year], 78 y.o.   MRN: [id number]  HPI Patient is 78 year-old American female with past medical history significant for diabetes, hypertension, hyperlipidemia, status post CVA and coronary artery disease with PTCA in 2005 managed on Plavix. She presents today with 2 daughters and that the patient is not receiving proper care when they are not present. They report that their mother is not receiving proper care in terms of her medication regimen at home with her current home health aide. Her blood pressure is 210/106 with pulse 92. They aren't sure of which antihypertensive medications she has had today. Her current regimen includes amlodipine 10 mg daily and enalapril 10 mg daily. Put the patient dementia appears to be worsening. They deny fever, chills, cold-like symptoms, dysuria, change in bowels but due report that the mother complained of headaches and dizziness within the past week. One of the daughters is interested in Plavix test to verify whether or not the mother is receiving the drug.  Review of Systems  Constitutional: Negative for fever.  HENT: Negative for congestion.   Eyes: Negative for visual disturbance.  Respiratory: Positive for shortness of breath. Negative for cough.   Cardiovascular: Positive for leg swelling. Negative for chest pain and palpitations.  Genitourinary: Negative for dysuria.  Neurological: Positive for dizziness and headaches. Negative for syncope, weakness and light-headedness.  Psychiatric/Behavioral: Positive for confusion.       Objective:   Physical Exam  Constitutional: She appears well-developed and well-nourished. No distress.  HENT:  Head: Atraumatic.  Eyes: Conjunctivae are normal. Pupils are equal, round, and reactive to light.  Neck: Normal range of motion. Neck supple.  Cardiovascular: Normal rate, regular rhythm and intact distal pulses.   Pulmonary/Chest: Effort normal and breath  sounds normal. She has no wheezes.  Abdominal: Soft. Bowel sounds are normal.  Musculoskeletal: Normal range of motion. She exhibits no edema.  Neurological: She is alert. She has normal strength. She is not disoriented.       Assessment:     1. Hypertensive Urgency: 210/106, recheck 192/110, family unsure if patient received any of her bp meds  2. Dementia: will needed further assessment, consider neuro referral  3. S/p CVA: daughter suspicious that home assistance is not giving her mother the Plavix and requests Plavix testing    Plan:     Will send to ER for further evaluation and co and control of bp

## 2011-05-24 NOTE — Assessment & Plan Note (Signed)
Progressing per family report, needs further assessment

## 2011-05-24 NOTE — ED Provider Notes (Signed)
History     CSN: [id number]  Arrival date & time 05/24/11  1707   First MD Initiated Contact with Patient 05/24/11 2011      Chief Complaint  Patient presents with  . Hypertension    HPI: A level V caveat applies due to his dementia Patient is a 78 y.o. female presenting with hypertension. The history is provided by the patient.  Hypertension This is a chronic problem. The current episode started today. The problem has been rapidly improving. Associated symptoms include fatigue. Pertinent negatives include no abdominal pain, chest pain, chills, coughing, fever, headaches, nausea, urinary symptoms or vomiting. The symptoms are aggravated by nothing. She has tried nothing for the symptoms.  Patient's daughter reports the patient was being seen in the [facility] today for a routine checkup. Blood pressure was found to be greater than 200/100 so the patient was sent here for further evaluation. The patient is a pleasantly confused 78 year old female here with her daughter. She denies any complaints. Daughter states only recent complaint is of increased fatigue and shortness of breath with exertion. Daughter also reports that she is cared for by her son and daughter and she believes that the son has been administering Plavix and aspirin instead of just the Plavix which was the direction from the physician. Her daughter is concerned about patient's clotting factors.  Past Medical History  Diagnosis Date  . Hyperlipidemia   . Hypertension   . Diabetes mellitus   . Chronic kidney disease     stage IV  . RAS (renal artery stenosis)   . Stroke     s/p CVA unknown timing  . Ischemic cardiomyopathy     EF 20%  . Dementia   . CAD (coronary artery disease)     s/p PTCA May 2005, stenting of an intermittent blanch of the CMX and the angioplasty of teh diagonal branch to the left descending    Past Surgical History  Procedure Date  . Appendectomy   . Total abdominal hysterectomy w/  bilateral salpingoophorectomy   . Angioplasty     and stent    Family History  Problem Relation Age of Onset  . Kidney disease Brother     History  Substance Use Topics  . Smoking status: Never Smoker   . Smokeless tobacco: Never Used  . Alcohol Use: No    OB History    Grav Para Term Preterm Abortions TAB SAB Ect Mult Living                  Review of Systems  Constitutional: Positive for fatigue. Negative for fever and chills.  HENT: Negative.   Eyes: Negative.   Respiratory: Negative.  Negative for cough.   Cardiovascular: Negative.  Negative for chest pain.  Gastrointestinal: Negative.  Negative for nausea, vomiting and abdominal pain.  Genitourinary: Negative.   Musculoskeletal: Negative.   Skin: Negative.   Neurological: Negative.  Negative for headaches.  Hematological: Negative.   Psychiatric/Behavioral: Negative.     Allergies  Review of patient's allergies indicates no known allergies.  Home Medications   Current Outpatient Rx  Name Route Sig Dispense Refill  . ACCU-CHEK SOFTCLIX LANCETS MISC  Use as instructed 360 each 11    Test 3-4 times a day as directed  . AMLODIPINE BESYLATE 10 MG PO TABS Oral Take 1 tablet (10 mg total) by mouth daily. 30 tablet 3    PLEASE RESPOND THANKS!  Lorenda Peck COMPACT PLUS CARE KIT Does not  apply 1 Device by Does not apply route 3 (three) times daily. 1 each 0  . CALCITRIOL 0.25 MCG PO CAPS  TAKE ONE (1) CAPSULE EACH DAY 30 capsule 6  . CLOPIDOGREL BISULFATE 75 MG PO TABS Oral Take 1 tablet (75 mg total) by mouth daily. 30 tablet 11  . ENALAPRIL MALEATE 10 MG PO TABS  TAKE ONE TABLET DAILY 30 tablet 6  . FUROSEMIDE 40 MG PO TABS Oral Take 1 tablet (40 mg total) by mouth daily. 30 tablet 3  . GLUCOSE BLOOD VI STRP  Use as instructed 100 each 12  . INSULIN GLARGINE 100 UNIT/ML [place] SOLN Subcutaneous Inject 20 Units into the skin at bedtime. 7.5 mL 11  . INSULIN LISPRO (HUMAN) 100 UNIT/ML [place] SOLN  BG 150-199 give 2 unit, BG  200-249 3 units, BG 250-299 4 units, BG 300-349 5 units, BG 350 and up, 5 units and call clinic [phone]  10 mL 12  . INSULIN PEN NEEDLE 31G X 8 MM MISC  Use as directed 100 each 11  . PRAVASTATIN SODIUM 40 MG PO TABS Oral Take 1 tablet (40 mg total) by mouth every evening. 30 tablet 11    BP 194/78  Pulse 83  Temp(Src) 98.3 F (36.8 C) (Oral)  Resp 20  SpO2 98%  Physical Exam  Constitutional: She is oriented to person, place, and time. She appears well-developed and well-nourished.  HENT:  Head: Normocephalic and atraumatic.  Eyes: Conjunctivae are normal.  Neck: Neck supple.  Cardiovascular: Normal rate and regular rhythm.   Pulmonary/Chest: Effort normal and breath sounds normal.  Abdominal: Soft. Bowel sounds are normal.  Musculoskeletal: Normal range of motion.  Neurological: She is alert and oriented to person, place, and time.  Skin: Skin is warm and dry. No erythema.  Psychiatric: She has a normal mood and affect.    ED Course  Procedures   Will obtain EKG, chest x-ray and appropriate screening labs and ultimately plan to consult with outpatient clinic M.D. for plan.   Date: 05/24/2011  Rate: 70  Rhythm: normal sinus rhythm  QRS Axis: normal  Intervals: normal  ST/T Wave abnormalities: nonspecific T wave changes  Conduction Disutrbances:none  Narrative Interpretation:   Old EKG Reviewed: unchanged  2320:   I have spoken with Dr. Thad Ranger w/ the Outpatient Clinic was agreed to see patient in the emergency department.  34: Patient continues to rest in no acute distress and denies any complaints. With Dr. Thad Ranger with the outpatient clinic has seen patient in the emergency department and feels it is appropriate to discharge patient home. There is some concern that patient's son is not administering medications appropriately so Dr. Thad Ranger plans to initiate home health services for assistance with medication administration. They have discussed patient with Dr.  Bebe Shaggy who has also seen patient. Will discharge home and instruct family to await call from outpatient clinic regarding home health and follow up appointment. Family agreeable with plan.  Labs Reviewed  GLUCOSE, CAPILLARY - Abnormal; Notable for the following:    Glucose-Capillary 154 (*)    All other components within normal limits  URINALYSIS, ROUTINE W REFLEX MICROSCOPIC  CBC  DIFFERENTIAL  BASIC METABOLIC PANEL  PROTIME-INR  APTT   No results found.   No diagnosis found.    MDM  HPI/PE and clinical findings c/w 1. HTN (210/106 upon arrival, 16109 at d/c. Patient with history of hypertension, there is some concern that her son who is also her care provider is negative  her medications appropriately. Outpatient clinic to arrange home health services for assistance with medication menstruation) 2. Anemia (Hmgb 10.2 form 11.2 I/2013, patient does complain of increased fatigue and occasional SOB, Dr. Thad Ranger with outpatient clinic aware.        Leanne Chang, NP 05/25/11 [phone]

## 2011-05-25 NOTE — ED Notes (Signed)
DR. Bebe Shaggy AT BEDSIDE SPEAKING WITH PT. AND FAMILY ON PLAN OF CARE AND DISCHARGE.

## 2011-05-25 NOTE — Discharge Instructions (Signed)
Please review the instructions below. [name] was evaluated in the emergency department tonight for her elevated blood pressure. Her chest x-ray, EKG and cardiac enzymes tonight were normal. Her labs reflecting her blood clotting were within normal range. Her remaining blood work and urinalysis were without acute findings. She is slightly anemic. She was been seen by the outpatient clinic physicians here in the emergency department who felt it was appropriate to send her home. They will be arranging for home health to come in and assist with medications. Continue all medications as previously instructed. They will also be contacting her regarding her follow up appointment. Return if she has worsening symptoms, otherwise follow up as instructed when the staff in the clinic call you back.  Hypertension Information As your heart beats, it forces blood through your arteries. This force is your blood pressure. If the pressure is too high, it is called hypertension (HTN) or high blood pressure. HTN is dangerous because you may have it and not know it. High blood pressure may mean that your heart has to work harder to pump blood. Your arteries may be narrow or stiff. The extra work puts you at risk for heart disease, stroke, and other problems.  Blood pressure consists of two numbers, a higher number over a lower, 110/72, for example. It is stated as "110 over 72." The ideal is below 120 for the top number (systolic) and under 80 for the bottom (diastolic).  You should pay close attention to your blood pressure if you have certain conditions such as:  Heart failure.   Prior heart attack.   Diabetes   Chronic kidney disease.   Prior stroke.   Multiple risk factors for heart disease.  To see if you have HTN, your blood pressure should be measured while you are seated with your arm held at the level of the heart. It should be measured at least twice. A one-time elevated blood pressure reading (especially  in the Emergency Department) does not mean that you need treatment. There may be conditions in which the blood pressure is different between your right and left arms. It is important to see your caregiver soon for a recheck. Most people have essential hypertension which means that there is not a specific cause. This type of high blood pressure may be lowered by changing lifestyle factors such as:  Stress.   Smoking.   Lack of exercise.   Excessive weight.   Drug/tobacco/alcohol use.   Eating less salt.  Most people do not have symptoms from high blood pressure until it has caused damage to the body. Effective treatment can often prevent, delay or reduce that damage. TREATMENT  Treatment for high blood pressure, when a cause has been identified, is directed at the cause. There are a large number of medications to treat HTN. These fall into several categories, and your caregiver will help you select the medicines that are best for you. Medications may have side effects. You should review side effects with your caregiver. If your blood pressure stays high after you have made lifestyle changes or started on medicines,   Your medication(s) may need to be changed.   Other problems may need to be addressed.   Be certain you understand your prescriptions, and know how and when to take your medicine.   Be sure to follow up with your caregiver within the time frame advised (usually within two weeks) to have your blood pressure rechecked and to review your medications.   If  you are taking more than one medicine to lower your blood pressure, make sure you know how and at what times they should be taken. Taking two medicines at the same time can result in blood pressure that is too low.  Document Released: 04/30/2005 Document Revised: 11/07/2010 Document Reviewed: 05/07/2007 [facility] Patient Information 2012 [place], [place].Hypertension Information As your heart beats, it forces blood through your  arteries. This force is your blood pressure. If the pressure is too high, it is called hypertension (HTN) or high blood pressure. HTN is dangerous because you may have it and not know it. High blood pressure may mean that your heart has to work harder to pump blood. Your arteries may be narrow or stiff. The extra work puts you at risk for heart disease, stroke, and other problems.  Blood pressure consists of two numbers, a higher number over a lower, 110/72, for example. It is stated as "110 over 72." The ideal is below 120 for the top number (systolic) and under 80 for the bottom (diastolic).  You should pay close attention to your blood pressure if you have certain conditions such as:  Heart failure.   Prior heart attack.   Diabetes   Chronic kidney disease.   Prior stroke.   Multiple risk factors for heart disease.  To see if you have HTN, your blood pressure should be measured while you are seated with your arm held at the level of the heart. It should be measured at least twice. A one-time elevated blood pressure reading (especially in the Emergency Department) does not mean that you need treatment. There may be conditions in which the blood pressure is different between your right and left arms. It is important to see your caregiver soon for a recheck. Most people have essential hypertension which means that there is not a specific cause. This type of high blood pressure may be lowered by changing lifestyle factors such as:  Stress.   Smoking.   Lack of exercise.   Excessive weight.   Drug/tobacco/alcohol use.   Eating less salt.  Most people do not have symptoms from high blood pressure until it has caused damage to the body. Effective treatment can often prevent, delay or reduce that damage. TREATMENT  Treatment for high blood pressure, when a cause has been identified, is directed at the cause. There are a large number of medications to treat HTN. These fall into several  categories, and your caregiver will help you select the medicines that are best for you. Medications may have side effects. You should review side effects with your caregiver. If your blood pressure stays high after you have made lifestyle changes or started on medicines,   Your medication(s) may need to be changed.   Other problems may need to be addressed.   Be certain you understand your prescriptions, and know how and when to take your medicine.   Be sure to follow up with your caregiver within the time frame advised (usually within two weeks) to have your blood pressure rechecked and to review your medications.   If you are taking more than one medicine to lower your blood pressure, make sure you know how and at what times they should be taken. Taking two medicines at the same time can result in blood pressure that is too low.  Document Released: 04/30/2005 Document Revised: 11/07/2010 Document Reviewed: 05/07/2007 [facility] Patient Information 2012 [place], [place].

## 2011-05-25 NOTE — ED Provider Notes (Signed)
Medical screening examination/treatment/procedure(s) were performed by non-physician practitioner and as supervising physician I was immediately available for consultation/collaboration.    Declynn Lopresti L Kieron Kantner, MD 05/25/11 1002 

## 2011-05-25 NOTE — Progress Notes (Addendum)
INTERNAL MEDICINE TEACHING SERVICE ER EVALUATION NOTE   Date: 05/25/2011  Patient name: [name]  Medical record number: [id number]   Date of birth: [birth year] Age: 78 y.o.  Gender:  female         PCP:  [name] Cowman, MD, MD       Chief Complaint: high blood pressure  History of Present Illness: Patient is a 78 y.o. female with a PMHx of  has a past medical history of Hyperlipidemia; Hypertension; Diabetes mellitus; Chronic kidney disease; RAS (renal artery stenosis); Stroke; Ischemic cardiomyopathy; Dementia; and CAD (coronary artery disease)., who presents to [facility] accompanied by her two daughters for evaluation of high blood pressure that was noted during a routine clinic visit today at [facility], with Dr. Bosie Clos. At this visit, the patient was found to have a blood pressure of 210/106 and heart rate of 92. During her ED course, without pharmacologic intervention, blood pressure slowly decreased to 156/91. Throughout out this course, the patient has remained asymptomatic. She denies current chest pain, shortness of breath, palpitations, vision changes, hematuria, dysuria, abdominal pain, lower extremity swelling.    Current Outpatient Medications: Medication Sig  . ACCU-CHEK SOFTCLIX LANCETS lancets Use as instructed  . amLODipine (NORVASC) 10 MG tablet Take 10 mg by mouth daily.  . Blood Glucose Monitoring Suppl (ACCU-CHEK COMPACT CARE KIT) KIT 1 Device by Does not apply route 3 (three) times daily.  . calcitRIOL (ROCALTROL) 0.25 MCG capsule Take 0.25 mcg by mouth daily.  . clopidogrel (PLAVIX) 75 MG tablet Take 75 mg by mouth daily.  . enalapril (VASOTEC) 10 MG tablet Take 10 mg by mouth daily.  . furosemide (LASIX) 40 MG tablet Take 40 mg by mouth daily.  Marland Kitchen glucose blood test strip Use as instructed  . insulin glargine (LANTUS) 100 UNIT/ML injection Inject 20 Units into the skin at bedtime.  . insulin lispro (HUMALOG) 100 UNIT/ML injection Inject 2-5 Units into the skin 3  (three) times daily before meals. BG 150-199 give 2 unit, BG 200-249 3 units, BG 250-299 4 units, BG 300-349 5 units, BG 350 and up, 5 units and call clinic [phone]  . Insulin Pen Needle 31G X 8 MM MISC Use as directed  . Multiple Vitamin (MULITIVITAMIN WITH MINERALS) TABS Take 1 tablet by mouth daily.  . pravastatin (PRAVACHOL) 40 MG tablet Take 40 mg by mouth every evening.    Allergies: Review of patient's allergies indicates no known allergies.  Past Medical History  Diagnosis Date  . Hyperlipidemia   . Hypertension   . Diabetes mellitus   . Chronic kidney disease     stage IV  . RAS (renal artery stenosis)   . Stroke     s/p CVA unknown timing  . Ischemic cardiomyopathy     EF 20%  . Dementia   . CAD (coronary artery disease)     s/p PTCA May 2005, stenting of an intermittent blanch of the CMX and the angioplasty of teh diagonal branch to the left descending    Past Surgical History  Procedure Date  . Appendectomy   . Total abdominal hysterectomy w/ bilateral salpingoophorectomy   . Angioplasty     and stent    Family History  Problem Relation Age of Onset  . Kidney disease Brother    Social History: History   Social History  . Marital Status: Married    Spouse Name: N/A    Number of Children: N/A  . Years of Education: N/A   Occupational  History  . Not on file.   Social History Main Topics  . Smoking status: Never Smoker   . Smokeless tobacco: Never Used  . Alcohol Use: No  . Drug Use: No  . Sexually Active: Not on file   Other Topics Concern  . Not on file   Social History Narrative   Patient gets regular exercise- walkingLives with husband and son; son manages medicationsNo FH of colon cancer    Review of Systems: Pertinent items are noted in HPI.  Vital Signs: BP 180/96  Pulse 84  Temp(Src) 98.2 F (36.8 C) (Oral)  Resp 16  SpO2 100%   Physical Exam: General: Vital signs reviewed and noted. Well-developed, well-nourished, in no  acute distress; alert, appropriate and cooperative throughout examination.  Head: Normocephalic, atraumatic.  Eyes: PERRL, No signs of anemia or jaundince.  Throat: Oropharynx nonerythematous, no exudate appreciated.   Neck: No deformities, masses, or tenderness noted.Supple, no JVD.  Lungs:  Normal respiratory effort. Clear to auscultation BL without crackles or wheezes.  Heart: RRR. S1 and S2 normal without gallop, murmur, or rubs.  Abdomen:  BS normoactive. Soft, Nondistended, non-tender.  No masses or organomegaly.  Extremities: No pretibial edema.  Neurologic: A&O X3, CN II - XII are grossly intact. Motor strength is 5/5 in the all 4 extremities, Sensations intact to light touch, Cerebellar signs negative.  Skin: No visible rashes, scars.   Lab results: Basic Metabolic Panel:  [facility] 05/24/11 2158  NA 138  K 3.7  CL 100  CO2 27  GLUCOSE 166*  BUN 26*  CREATININE 1.71*  CALCIUM 9.7  MG --  PHOS --   CBC:  Basename 05/24/11 2158  WBC 8.6  NEUTROABS 5.1  HGB 10.2*  HCT 29.8*  MCV 91.7  PLT 298   Troponin (Point of Care Test)  [facility] 05/24/11 2212  TROPIPOC 0.03   CBG:  Basename 05/24/11 1835  GLUCAP 154*   Coagulation:  Basename 05/24/11 2158  LABPROT 12.4  INR 0.91   Urinalysis:  Basename 05/24/11 2142  COLORURINE YELLOW  LABSPEC 1.008  PHURINE 6.5  GLUCOSEU NEGATIVE  HGBUR NEGATIVE  BILIRUBINUR NEGATIVE  KETONESUR NEGATIVE  PROTEINUR 30*  UROBILINOGEN 0.2  NITRITE NEGATIVE  LEUKOCYTESUR NEGATIVE     Imaging results:  Dg Chest 2 View (05/24/2011) - No acute cardiopulmonary process seen.  Original Report Authenticated By: Tonia Ghent, M.D.    Other results:  EKG (05/25/2011) - Normal Sinus Rhythm, regular rate of approximately 70 bpm, normal axis, ST segments: t wave inversions in V5, V6, also noted in EKG from 03/2011..    Assessment & Plan: Patient is a 78 y.o. female with a PMHx of past medical history of DMII, CKD, severe  dementia, CAD, and ischemic cardiomyopathy, who presented to [facility] for evaluation of high blood pressure to 210/106 found during a routine clinic visit today. She was recommended ER follow-up where blood pressure has decreased to 156/91 without pharmacologic intervention. The patient has remained asymptomatic during this evaluation.   1. Hypertension - patient had transiently elevated blood pressure to 210/106, this is likely secondary to noncompliance with medications. Specifically, the daughters (one who lives with the patient, and a second daughter who is present during evaluation) are very concerned that the patient's blood pressure medications are not being given regularly by the son with whom she lives. The patient is completely asymptomatic at this time. As well, CE have been neg x1, EKG is unchanged from baseline, CXR is neg for acute pulmonary  edema, renal function is within normal limits, and vision is unchanged - therefore, unlikely a hypertensive emergency. That being said, family was extensively counseled from red flag symptoms to prompt reevaluation to ER.  Plan:  1. Discharge patient to home. 2. Recommend close outpatient clinic follow-up within 3-5 days (since this is the weekend) - daughter will call on Monday, and I will send a flag to triage to request appt between Monday-Wednesday for reevaluation. 3. Spoke with daughter who lives with the patient and requested BP medications to be administered at nighttime (when daughter gets home), so that they can be reliably administered. 4. May benefit from [facility] or nurse aide to help with medication administration - therefore, will request at follow-up appt for arrangements for [facility] or aide to be made to help with med administration. 5. Recommend return to the ER if symptoms worsen or new symptoms arise - specifically, if she develops shortness of breath, chest pain, lower extremity swelling, abdominal pain, vision change, mental status change from  baseline, or other concerning symptoms, both daughters and patient were advised to immediate ER return for evaluation. 6. Recommend continue all medications as prescribed.   Johnette Abraham, Norman Herrlich, Internal Medicine Resident 05/25/2011, 12:49 AM        Addendum: Decision to discharge the patient to home was discussed with Cloyde Reams, NP, and reviewed and approved by ED attending Dr. Zadie Rhine, prior to discharge home.

## 2011-05-25 NOTE — ED Provider Notes (Signed)
Pt well appearing,no distress, BP improved She has been seen by outpatient clinic, feel safe for d/c, outpatient arrangements have been made She has no complaints, no distress Family agreeable with plan BP 180/96  Pulse 84  Temp(Src) 98.2 F (36.8 C) (Oral)  Resp 16  SpO2 100%   Joya Gaskins, MD 05/25/11 [phone]

## 2011-05-25 NOTE — ED Notes (Signed)
PT. SEEN AND EXAMINED BY INTERNAL MEDICINE SERVICE MDs .

## 2011-05-28 ENCOUNTER — Ambulatory Visit (INDEPENDENT_AMBULATORY_CARE_PROVIDER_SITE_OTHER): Payer: Medicare Other | Admitting: Internal Medicine

## 2011-05-28 ENCOUNTER — Encounter: Payer: Self-pay | Admitting: Internal Medicine

## 2011-05-28 VITALS — BP 152/99 | HR 87 | Temp 97.4°F | Ht 63.0 in | Wt 151.0 lb

## 2011-05-28 DIAGNOSIS — E119 Type 2 diabetes mellitus without complications: Secondary | ICD-10-CM

## 2011-05-28 DIAGNOSIS — F0390 Unspecified dementia without behavioral disturbance: Secondary | ICD-10-CM

## 2011-05-28 DIAGNOSIS — I10 Essential (primary) hypertension: Secondary | ICD-10-CM

## 2011-05-28 DIAGNOSIS — E785 Hyperlipidemia, unspecified: Secondary | ICD-10-CM

## 2011-05-28 MED ORDER — MEMANTINE HCL 5 MG PO TABS: 5.0000 mg | ORAL_TABLET | Freq: Every day | ORAL | Status: DC

## 2011-05-28 MED ORDER — DONEPEZIL HCL 10 MG PO TABS: 10.0000 mg | ORAL_TABLET | Freq: Every day | ORAL | Status: DC

## 2011-05-28 NOTE — Progress Notes (Signed)
CSW met with pt and daughter, [name] during scheduled visit.  [name] lives with daughter, [name] and son.  Family provides care and assistance with ADL's.  Daughter states pt has had one visit to the home from an nurse but thinks pt's son turned nurse away. Daughter states pt's son is hesitant with letting agencies in to the home to provide care but it would be needed.  Daughter, [name], states the pt's children often have conflicting perspectives on pt's care.  Pt agreeable to home health services.  Family has no specified choice of agency.  CSW discussed Advanced Directives with pt and daughter and provided booklet.  CSW will refer to [facility] or [facility] depending on insurance.  Pt's dau has CSW contact information.

## 2011-05-28 NOTE — Patient Instructions (Addendum)
It was nice to see you both again today.  I am glad that the very high blood pressure has returned close to normal today. We will recheck it at your next appointment. Your mother has severe cognitive dementia. This is likely related to age in addition to her prior strokes. If you decoded to try medications to help prevent further progression of the dementia, please call the clinic. The medications that we are considering adding are Aricept and Namenda.  The social worker is assisting with getting Home Health to assist at home.  They should be in contact with you over the nest several days.  Continue to take the Lantus and other insulin as prescribed.  You all are doing a great job at keeping track of her medical regimen.  Follow-up in 1-2 months or early if needed.

## 2011-05-28 NOTE — Progress Notes (Signed)
Subjective:     Patient ID: [name], female   DOB: 07/20/1931, 78 y.o.   MRN: [id number]  HPI Patient presents today for recheck of her blood pressure and evaluation of dementia. Of note she was here last week noted to have hypertensive urgency with systolic blood pressure greater than 200. This resolved in the emergency department.  Review of Systems  Constitutional: Negative for fever and fatigue.  HENT: Negative for congestion.   Eyes: Negative for visual disturbance.  Respiratory: Negative for cough and shortness of breath.   Cardiovascular: Negative for chest pain.  Genitourinary: Negative for dysuria.  Musculoskeletal: Negative for back pain, arthralgias and gait problem.  Neurological: Negative for seizures, weakness, numbness and headaches.  Psychiatric/Behavioral: Positive for confusion and decreased concentration. Negative for hallucinations and dysphoric mood.       Objective:   Physical Exam  Constitutional: She appears well-developed and well-nourished. No distress.       Pleasantly demented African American female  HENT:  Head: Normocephalic and atraumatic.  Eyes: Conjunctivae and EOM are normal. Pupils are equal, round, and reactive to light.  Neck: Normal range of motion. Neck supple.  Cardiovascular: Normal rate, regular rhythm, normal heart sounds and intact distal pulses.   Pulmonary/Chest: Effort normal and breath sounds normal.  Abdominal: Soft. Bowel sounds are normal.  Musculoskeletal: Normal range of motion. She exhibits no edema.  Neurological: She is alert. She has normal strength. No cranial nerve deficit or sensory deficit.       MMSE 13/30 indicating severe cognitive impairment  Skin: Skin is warm and dry.  Psychiatric: She has a normal mood and affect. Her behavior is normal.       Assessment:     #1 dementia Alzheimer-like: This has been progressive since her stroke in 2002/08/12, daughter has noted moderate progression in mother's confusion  since father died in 2010-08-12#2 hypertension: Better control today 152/99 pulse 87 on amlodipine 10 mg and enalapril 10 mg daily    Plan:     -Patient's daughter counseled to the prognosis of patient's severe cognitive impairment.  -Advised of options including initiation of medical therapy to decrease the rate of progression of patient's dementia.-Daughter like to speak with the rest of the family in reference to commencing Aricept and Namenda   -Will defer referral to neurologist at this point  -Daughter spoke with Child psychotherapist and home health aide referral was initiated to assist with ADLs, and medicines

## 2011-05-29 ENCOUNTER — Telehealth: Payer: Self-pay | Admitting: *Deleted

## 2011-05-29 ENCOUNTER — Other Ambulatory Visit: Payer: Self-pay | Admitting: Internal Medicine

## 2011-05-29 DIAGNOSIS — F0390 Unspecified dementia without behavioral disturbance: Secondary | ICD-10-CM

## 2011-05-29 NOTE — Telephone Encounter (Signed)
[name], [facility] calls and states it has been determined that pt does not need a home health aide but she does need an order for PT for eval and treat, may we approve this?

## 2011-05-29 NOTE — Telephone Encounter (Signed)
Order for HH PT placed

## 2011-05-30 NOTE — Telephone Encounter (Signed)
Verbal approval  given to caller

## 2011-05-31 ENCOUNTER — Other Ambulatory Visit: Payer: Self-pay | Admitting: Internal Medicine

## 2011-05-31 NOTE — Telephone Encounter (Signed)
Telephone call from pt's daughter-said that Dr. Bosie Clos had talked with her about starting pt on medication for Dementia.  Pt's daughter said that she has discussed the possibility with her siblings. They have decided to to have pt started on patches for her Dementia.  Would like for the prescription to be sent to [facility].  Angelina Ok, RN 05/31/2011 4:45 PM.

## 2011-06-01 MED ORDER — RIVASTIGMINE 4.6 MG/24HR TD PT24: 1.0000 | MEDICATED_PATCH | Freq: Every day | TRANSDERMAL | Status: DC

## 2011-06-01 MED ORDER — RIVASTIGMINE 9.5 MG/24HR TD PT24: 1.0000 | MEDICATED_PATCH | Freq: Every day | TRANSDERMAL | Status: DC

## 2011-06-18 ENCOUNTER — Ambulatory Visit (INDEPENDENT_AMBULATORY_CARE_PROVIDER_SITE_OTHER): Payer: Medicare Other | Admitting: Internal Medicine

## 2011-06-18 ENCOUNTER — Encounter: Payer: Self-pay | Admitting: Internal Medicine

## 2011-06-18 ENCOUNTER — Ambulatory Visit (HOSPITAL_COMMUNITY)
Admission: RE | Admit: 2011-06-18 | Discharge: 2011-06-18 | Disposition: A | Discharge status: Home / Self Care | Payer: Medicare Other | Source: Ambulatory Visit | Attending: Internal Medicine | Admitting: Internal Medicine

## 2011-06-18 VITALS — BP 161/79 | HR 78 | Temp 97.8°F | Ht 63.0 in | Wt 150.7 lb

## 2011-06-18 DIAGNOSIS — M25559 Pain in unspecified hip: Secondary | ICD-10-CM

## 2011-06-18 DIAGNOSIS — I10 Essential (primary) hypertension: Secondary | ICD-10-CM

## 2011-06-18 DIAGNOSIS — E119 Type 2 diabetes mellitus without complications: Secondary | ICD-10-CM

## 2011-06-18 DIAGNOSIS — Z79899 Other long term (current) drug therapy: Secondary | ICD-10-CM

## 2011-06-18 DIAGNOSIS — M25551 Pain in right hip: Secondary | ICD-10-CM | POA: Insufficient documentation

## 2011-06-18 MED ORDER — ENALAPRIL MALEATE 10 MG PO TABS: 20.0000 mg | ORAL_TABLET | Freq: Every day | ORAL | Status: DC

## 2011-06-18 MED ORDER — TRAMADOL HCL 50 MG PO TABS: 50.0000 mg | ORAL_TABLET | Freq: Four times a day (QID) | ORAL | Status: DC | PRN

## 2011-06-18 NOTE — Progress Notes (Signed)
Subjective:     Patient ID: [name], female   DOB: [birth year], 78 y.o.   MRN: [id number]  HPI PT is a 47 very pleasant woman with a PMH of dementia, DMII, and HTN who presents with R hip pain.  Please see individual problems for history, asessment, and plan.   Review of Systems As per problems    Objective:   Physical Exam Gen: pleasant, nad Skin: R buttock shows walnut sized area of mild hyperpigmenation without erythema, TTP, fluctuance, or active lesion. MSK: R hip with good ROM, some mild groin pain elicited with flexion and internal rotation.  Neg straight leg raise.  Good strength.    Assessment:         Plan:

## 2011-06-18 NOTE — Assessment & Plan Note (Signed)
BP Readings from Last 3 Encounters:  06/18/11 161/79  05/28/11 152/99  05/25/11 180/96   Pt has shown some consistent elevation of BP including one ED visit for HTN (benign, no urgency). - increasing vasotec today

## 2011-06-18 NOTE — Assessment & Plan Note (Addendum)
Pt describes R hip pain in the form of posterior right buttock pain.  Unclear duration of time as pt has dementia, but has been complaining of it since at least January.  No exac/alleviating factors, pain is sharp.  No recent falls.  Brother, who does some of the caretaking, thinks it is from coumadin.  Exam shows only what looks like a possible old healed pressure ulcer of the posterior buttock, but definitely benign.  MSK exam does elicit some groin/hip pain.  Xray obtained shows advanced OA on the L and R hips.  I am surprised the pt has not had more pain in the past.  Pt still ambulating well, does not seem to be that debilitating. - pt has CKD and is on coumadin, so NSAIDs may not be the best option. - will try apap and trial of tramadol - can consider PT if no improvement.

## 2011-06-19 ENCOUNTER — Telehealth: Payer: Self-pay | Admitting: *Deleted

## 2011-06-19 NOTE — Telephone Encounter (Signed)
Call from [facility], [facility].  Requesting verbal order for 1. SW consult - for community resources 2. Additional RN visits x 2.   Verbal order given; but if not o.k. Let me know otherwise  Thanks

## 2011-06-20 ENCOUNTER — Other Ambulatory Visit: Payer: Self-pay | Admitting: *Deleted

## 2011-06-20 MED ORDER — GLUCOSE BLOOD VI STRP: ORAL_STRIP | Status: DC

## 2011-07-03 ENCOUNTER — Other Ambulatory Visit: Payer: Self-pay | Admitting: *Deleted

## 2011-07-04 MED ORDER — RIVASTIGMINE 9.5 MG/24HR TD PT24: 1.0000 | MEDICATED_PATCH | Freq: Every day | TRANSDERMAL | Status: DC

## 2011-07-17 ENCOUNTER — Emergency Department (HOSPITAL_COMMUNITY): Payer: Medicare Other

## 2011-07-17 ENCOUNTER — Encounter (HOSPITAL_COMMUNITY): Payer: Self-pay | Admitting: *Deleted

## 2011-07-17 ENCOUNTER — Emergency Department (HOSPITAL_COMMUNITY)
Admission: EM | Admit: 2011-07-17 | Discharge: 2011-07-17 | Disposition: A | Discharge status: Home / Self Care | Payer: Medicare Other | Attending: Emergency Medicine | Admitting: Emergency Medicine

## 2011-07-17 DIAGNOSIS — Z794 Long term (current) use of insulin: Secondary | ICD-10-CM | POA: Insufficient documentation

## 2011-07-17 DIAGNOSIS — F0390 Unspecified dementia without behavioral disturbance: Secondary | ICD-10-CM | POA: Insufficient documentation

## 2011-07-17 DIAGNOSIS — M545 Low back pain, unspecified: Secondary | ICD-10-CM | POA: Insufficient documentation

## 2011-07-17 DIAGNOSIS — E785 Hyperlipidemia, unspecified: Secondary | ICD-10-CM | POA: Insufficient documentation

## 2011-07-17 DIAGNOSIS — M25559 Pain in unspecified hip: Secondary | ICD-10-CM | POA: Insufficient documentation

## 2011-07-17 DIAGNOSIS — I129 Hypertensive chronic kidney disease with stage 1 through stage 4 chronic kidney disease, or unspecified chronic kidney disease: Secondary | ICD-10-CM | POA: Insufficient documentation

## 2011-07-17 DIAGNOSIS — Z8673 Personal history of transient ischemic attack (TIA), and cerebral infarction without residual deficits: Secondary | ICD-10-CM | POA: Insufficient documentation

## 2011-07-17 DIAGNOSIS — N184 Chronic kidney disease, stage 4 (severe): Secondary | ICD-10-CM | POA: Insufficient documentation

## 2011-07-17 DIAGNOSIS — I2510 Atherosclerotic heart disease of native coronary artery without angina pectoris: Secondary | ICD-10-CM | POA: Insufficient documentation

## 2011-07-17 DIAGNOSIS — E119 Type 2 diabetes mellitus without complications: Secondary | ICD-10-CM | POA: Insufficient documentation

## 2011-07-17 DIAGNOSIS — Z79899 Other long term (current) drug therapy: Secondary | ICD-10-CM | POA: Insufficient documentation

## 2011-07-17 DIAGNOSIS — I10 Essential (primary) hypertension: Secondary | ICD-10-CM

## 2011-07-17 LAB — COMPREHENSIVE METABOLIC PANEL
ALT: 9 U/L (ref 0–35)
AST: 17 U/L (ref 0–37)
Calcium: 9.6 mg/dL (ref 8.4–10.5)
GFR calc Af Amer: 29 mL/min — ABNORMAL LOW (ref >90)
Glucose, Bld: 214 mg/dL — ABNORMAL HIGH (ref 70–99)
Sodium: 137 mEq/L (ref 135–145)
Total Protein: 7.5 g/dL (ref 6.0–8.3)

## 2011-07-17 LAB — APTT: aPTT: 31 seconds (ref 24–37)

## 2011-07-17 LAB — CBC
MCH: 31 pg (ref 26.0–34.0)
MCHC: 34.2 g/dL (ref 30.0–36.0)
Platelets: 319 10*3/uL (ref 150–400)

## 2011-07-17 LAB — POCT I-STAT TROPONIN I: Troponin i, poc: 0.07 ng/mL (ref 0.00–0.08)

## 2011-07-17 LAB — PROTIME-INR: INR: 0.9 (ref 0.00–1.49)

## 2011-07-17 MED ORDER — SODIUM CHLORIDE 0.9 % IV SOLN
1000.0000 mL | INTRAVENOUS | Status: DC
  Administered 2011-07-17: 1000 mL via INTRAVENOUS

## 2011-07-17 MED ORDER — AMLODIPINE BESYLATE 5 MG PO TABS: 5.0000 mg | ORAL_TABLET | Freq: Once | ORAL | Status: DC

## 2011-07-17 MED ORDER — ENALAPRIL MALEATE 20 MG PO TABS
20.0000 mg | ORAL_TABLET | Freq: Every day | ORAL | Status: DC
  Administered 2011-07-17: 20 mg via ORAL
  Filled 2011-07-17 (×2): qty 1

## 2011-07-17 MED ORDER — AMLODIPINE BESYLATE 10 MG PO TABS
10.0000 mg | ORAL_TABLET | Freq: Once | ORAL | Status: AC
  Administered 2011-07-17: 10 mg via ORAL
  Filled 2011-07-17: qty 1

## 2011-07-17 NOTE — Discharge Instructions (Signed)
Arterial Hypertension Arterial hypertension (high blood pressure) is a condition of elevated pressure in your blood vessels. Hypertension over a long period of time is a risk factor for strokes, heart attacks, and heart failure. It is also the leading cause of kidney (renal) failure.  CAUSES   In Adults -- Over 90% of all hypertension has no known cause. This is called essential or primary hypertension. In the other 10% of people with hypertension, the increase in blood pressure is caused by another disorder. This is called secondary hypertension. Important causes of secondary hypertension are:   Heavy alcohol use.   Obstructive sleep apnea.   Hyperaldosterosim (Conn's syndrome).   Steroid use.   Chronic kidney failure.   Hyperparathyroidism.   Medications.   Renal artery stenosis.   Pheochromocytoma.   Cushing's disease.   Coarctation of the aorta.   Scleroderma renal crisis.   Licorice (in excessive amounts).   Drugs (cocaine, methamphetamine).  Your caregiver can explain any items above that apply to you.  In Children -- Secondary hypertension is more common and should always be considered.   Pregnancy -- Few women of childbearing age have high blood pressure. However, up to 10% of them develop hypertension of pregnancy. Generally, this will not harm the woman. It may be a sign of 3 complications of pregnancy: preeclampsia, HELLP syndrome, and eclampsia. Follow up and control with medication is necessary.  SYMPTOMS   This condition normally does not produce any noticeable symptoms. It is usually found during a routine exam.   Malignant hypertension is a late problem of high blood pressure. It may have the following symptoms:   Headaches.   Blurred vision.   End-organ damage (this means your kidneys, heart, lungs, and other organs are being damaged).   Stressful situations can increase the blood pressure. If a person with normal blood pressure has their blood  pressure go up while being seen by their caregiver, this is often termed "white coat hypertension." Its importance is not known. It may be related with eventually developing hypertension or complications of hypertension.   Hypertension is often confused with mental tension, stress, and anxiety.  DIAGNOSIS  The diagnosis is made by 3 separate blood pressure measurements. They are taken at least 1 week apart from each other. If there is organ damage from hypertension, the diagnosis may be made without repeat measurements. Hypertension is usually identified by having blood pressure readings:  Above 140/90 mmHg measured in both arms, at 3 separate times, over a couple weeks.   Over 130/80 mmHg should be considered a risk factor and may require treatment in patients with diabetes.  Blood pressure readings over 120/80 mmHg are called "pre-hypertension" even in non-diabetic patients. To get a true blood pressure measurement, use the following guidelines. Be aware of the factors that can alter blood pressure readings.  Take measurements at least 1 hour after caffeine.   Take measurements 30 minutes after smoking and without any stress. This is another reason to quit smoking - it raises your blood pressure.   Use a proper cuff size. Ask your caregiver if you are not sure about your cuff size.   Most home blood pressure cuffs are automatic. They will measure systolic and diastolic pressures. The systolic pressure is the pressure reading at the start of sounds. Diastolic pressure is the pressure at which the sounds disappear. If you are elderly, measure pressures in multiple postures. Try sitting, lying or standing.   Sit at rest for a minimum of   5 minutes before taking measurements.   You should not be on any medications like decongestants. These are found in many cold medications.   Record your blood pressure readings and review them with your caregiver.  If you have hypertension:  Your caregiver  may do tests to be sure you do not have secondary hypertension (see "causes" above).   Your caregiver may also look for signs of metabolic syndrome. This is also called Syndrome X or Insulin Resistance Syndrome. You may have this syndrome if you have type 2 diabetes, abdominal obesity, and abnormal blood lipids in addition to hypertension.   Your caregiver will take your medical and family history and perform a physical exam.   Diagnostic tests may include blood tests (for glucose, cholesterol, potassium, and kidney function), a urinalysis, or an EKG. Other tests may also be necessary depending on your condition.  PREVENTION  There are important lifestyle issues that you can adopt to reduce your chance of developing hypertension:  Maintain a normal weight.   Limit the amount of salt (sodium) in your diet.   Exercise often.   Limit alcohol intake.   Get enough potassium in your diet. Discuss specific advice with your caregiver.   Follow a DASH diet (dietary approaches to stop hypertension). This diet is rich in fruits, vegetables, and low-fat dairy products, and avoids certain fats.  PROGNOSIS  Essential hypertension cannot be cured. Lifestyle changes and medical treatment can lower blood pressure and reduce complications. The prognosis of secondary hypertension depends on the underlying cause. Many people whose hypertension is controlled with medicine or lifestyle changes can live a normal, healthy life.  RISKS AND COMPLICATIONS  While high blood pressure alone is not an illness, it often requires treatment due to its short- and long-term effects on many organs. Hypertension increases your risk for:  CVAs or strokes (cerebrovascular accident).   Heart failure due to chronically high blood pressure (hypertensive cardiomyopathy).   Heart attack (myocardial infarction).   Damage to the retina (hypertensive retinopathy).   Kidney failure (hypertensive nephropathy).  Your caregiver can  explain list items above that apply to you. Treatment of hypertension can significantly reduce the risk of complications. TREATMENT   For overweight patients, weight loss and regular exercise are recommended. Physical fitness lowers blood pressure.   Mild hypertension is usually treated with diet and exercise. A diet rich in fruits and vegetables, fat-free dairy products, and foods low in fat and salt (sodium) can help lower blood pressure. Decreasing salt intake decreases blood pressure in a 1/3 of people.   Stop smoking if you are a smoker.  The steps above are highly effective in reducing blood pressure. While these actions are easy to suggest, they are difficult to achieve. Most patients with moderate or severe hypertension end up requiring medications to bring their blood pressure down to a normal level. There are several classes of medications for treatment. Blood pressure pills (antihypertensives) will lower blood pressure by their different actions. Lowering the blood pressure by 10 mmHg may decrease the risk of complications by as much as 25%. The goal of treatment is effective blood pressure control. This will reduce your risk for complications. Your caregiver will help you determine the best treatment for you according to your lifestyle. What is excellent treatment for one person, may not be for you. HOME CARE INSTRUCTIONS   Do not smoke.   Follow the lifestyle changes outlined in the "Prevention" section.   If you are on medications, follow the directions   carefully. Blood pressure medications must be taken as prescribed. Skipping doses reduces their benefit. It also puts you at risk for problems.   Follow up with your caregiver, as directed.   If you are asked to monitor your blood pressure at home, follow the guidelines in the "Diagnosis" section above.  SEEK MEDICAL CARE IF:   You think you are having medication side effects.   You have recurrent headaches or lightheadedness.     You have swelling in your ankles.   You have trouble with your vision.  SEEK IMMEDIATE MEDICAL CARE IF:   You have sudden onset of chest pain or pressure, difficulty breathing, or other symptoms of a heart attack.   You have a severe headache.   You have symptoms of a stroke (such as sudden weakness, difficulty speaking, difficulty walking).  MAKE SURE YOU:   Understand these instructions.   Will watch your condition.   Will get help right away if you are not doing well or get worse.  Document Released: 02/25/2005 Document Revised: 02/14/2011 Document Reviewed: 09/25/2006 [employer] Patient Information 2012 [employer]. 

## 2011-07-17 NOTE — ED Provider Notes (Addendum)
History    CSN: [id number] Arrival date & time 07/17/11  2051 First MD Initiated Contact with Patient 07/17/11 2134     Chief Complaint  Patient presents with  . Pain    generalized   HPI Family states that she started having pain in her back this evening.  Pt now however denies any significant pain.  She states she might hurt a little bit in her lower back but nothing significant..  No CP or shortness of breath.  No fevers, or cough.  No trouble urinating. Per the family the pain became more severe around 1900. She was having pain in her back as well as her left hip.  The symptoms have mostly resolved at this point. Patient also has a history of hypertension but has not taken any of her medications today.   Past Medical History  Diagnosis Date  . Hyperlipidemia   . Hypertension   . Diabetes mellitus   . Chronic kidney disease     stage IV  . RAS (renal artery stenosis)   . Stroke     s/p CVA unknown timing  . Ischemic cardiomyopathy     EF 20%  . Dementia   . CAD (coronary artery disease)     s/p PTCA May 2005, stenting of an intermittent blanch of the CMX and the angioplasty of teh diagonal branch to the left descending    Past Surgical History  Procedure Date  . Appendectomy   . Total abdominal hysterectomy w/ bilateral salpingoophorectomy   . Angioplasty     and stent    Family History  Problem Relation Age of Onset  . Kidney disease Brother     History  Substance Use Topics  . Smoking status: Never Smoker   . Smokeless tobacco: Never Used  . Alcohol Use: No    OB History    Grav Para Term Preterm Abortions TAB SAB Ect Mult Living                  Review of Systems  All other systems reviewed and are negative.    Allergies  Review of patient's allergies indicates no known allergies.  Home Medications   Current Outpatient Rx  Name Route Sig Dispense Refill  . AMLODIPINE BESYLATE 10 MG PO TABS  TAKE ONE TABLET DAILY 30 tablet 11  . ACCU-CHEK  COMPACT PLUS CARE KIT Does not apply 1 Device by Does not apply route 3 (three) times daily. 1 each 0  . CALCITRIOL 0.25 MCG PO CAPS Oral Take 0.25 mcg by mouth daily.    Marland Kitchen CALCITRIOL 0.25 MCG PO CAPS  TAKE ONE (1) CAPSULE EACH DAY 30 capsule 11  . CLOPIDOGREL BISULFATE 75 MG PO TABS Oral Take 75 mg by mouth daily.    . ENALAPRIL MALEATE 10 MG PO TABS Oral Take 2 tablets (20 mg total) by mouth daily. 60 tablet 3  . FUROSEMIDE 40 MG PO TABS      . INSULIN GLARGINE 100 UNIT/ML Merna SOLN Subcutaneous Inject 20 Units into the skin at bedtime.    . INSULIN LISPRO (HUMAN) 100 UNIT/ML  SOLN Subcutaneous Inject 2-5 Units into the skin 3 (three) times daily before meals. BG 150-199 give 2 unit, BG 200-249 3 units, BG 250-299 4 units, BG 300-349 5 units, BG 350 and up, 5 units and call clinic [phone]    . ADULT MULTIVITAMIN W/MINERALS CH Oral Take 1 tablet by mouth daily.    Marland Kitchen PRAVASTATIN SODIUM 40 MG PO  TABS Oral Take 40 mg by mouth every evening.    Marland Kitchen RIVASTIGMINE 9.5 MG/24HR TD PT24 Transdermal Place 1 patch (9.5 mg total) onto the skin daily. 30 patch 12  . TRAMADOL HCL 50 MG PO TABS Oral Take 1 tablet (50 mg total) by mouth every 6 (six) hours as needed for pain. 30 tablet 1  . ENALAPRIL MALEATE 10 MG PO TABS Oral Take 1 tablet (10 mg total) by mouth daily. 30 tablet 0  . RIVASTIGMINE 4.6 MG/24HR TD PT24 Transdermal Place 1 patch (4.6 mg total) onto the skin daily. Use 4.6 mg patch every day x 4 weeks then start 9.5 mg patch every day 30 patch 0    BP 226/114  Pulse 102  Temp(Src) 98.9 F (37.2 C) (Oral)  Resp 16  SpO2 99%  Physical Exam  Nursing note and vitals reviewed. Constitutional: She appears well-developed and well-nourished. No distress.       Pleasant and laughing  HENT:  Head: Normocephalic and atraumatic.  Right Ear: External ear normal.  Left Ear: External ear normal.  Eyes: Conjunctivae are normal. Right eye exhibits no discharge. Left eye exhibits no discharge. No scleral  icterus.  Neck: Neck supple. No tracheal deviation present.  Cardiovascular: Normal rate, regular rhythm and intact distal pulses.   Pulmonary/Chest: Effort normal and breath sounds normal. No stridor. No respiratory distress. She has no wheezes. She has no rales.  Abdominal: Soft. Bowel sounds are normal. She exhibits no distension. There is no tenderness. There is no rebound and no guarding.  Musculoskeletal: She exhibits no edema and no tenderness.  Neurological: She is alert. She has normal strength. No sensory deficit. Cranial nerve deficit:  no gross defecits noted. She exhibits normal muscle tone. She displays no seizure activity. Coordination normal.  Skin: Skin is warm and dry. No rash noted.  Psychiatric: She has a normal mood and affect.    ED Course  Procedures (including critical care time) EKG: SINUS RHYTHM Rate 83, nl axis VENTRICULAR PREMATURE COMPLEX ~ V complex w/ short R-R interval LVH WITH SECONDARY REPOLARIZATION ABNORMALITY ~ R56L/RISIII/S12R56/S3RL & rep abn No old ekg  Labs Reviewed  CBC - Abnormal; Notable for the following:    RBC 3.39 (*)    Hemoglobin 10.5 (*)    HCT 30.7 (*)    All other components within normal limits  COMPREHENSIVE METABOLIC PANEL - Abnormal; Notable for the following:    Potassium 3.4 (*)    Glucose, Bld 214 (*)    BUN 27 (*)    Creatinine, Ser 1.85 (*)    GFR calc non Af Amer 25 (*)    GFR calc Af Amer 29 (*)    All other components within normal limits  PROTIME-INR  APTT  POCT I-STAT TROPONIN I   Dg Chest Portable 1 View  07/17/2011  *RADIOLOGY REPORT*  Clinical Data: Diabetes and confusion.  PORTABLE CHEST - 1 VIEW  Comparison: 05/24/2011  Findings: 2200 hours.  Stable asymmetric elevation of the right hemidiaphragm. The lungs are clear without focal infiltrate, edema, pneumothorax or pleural effusion. The cardiopericardial silhouette is enlarged. Telemetry leads overlie the chest.  IMPRESSION: Cardiomegaly without acute  cardiopulmonary findings.  Stable exam.  Original Report Authenticated By: ERIC A. MANSELL, M.D.    1. HYPERTENSION     MDM  The patient is relatively asymptomatic at this time. She is denying any chest pain or abdominal pain. She complained of back pain earlier but it does not seem to be affecting  her at this time. Her hypertension is improved with her oral medications.  There does not seem to be evidence of an acute emergency medical condition. Patient be discharged home and encouraged close followup with her primary Dr. Prince Solian findings have been discussed with patient and her family.        Celene Kras, MD 07/17/11 1191  Celene Kras, MD 07/17/11 [phone]

## 2011-07-17 NOTE — ED Notes (Signed)
Son states mother c/o back pain since this am, but pain became severe around 1900.  Pt c/o back and leg pain, esp in L hip pain.  Pt hypertensive.

## 2011-08-07 ENCOUNTER — Other Ambulatory Visit: Payer: Self-pay | Admitting: *Deleted

## 2011-08-07 DIAGNOSIS — M25551 Pain in right hip: Secondary | ICD-10-CM

## 2011-08-07 MED ORDER — TRAMADOL HCL 50 MG PO TABS: 50.0000 mg | ORAL_TABLET | Freq: Four times a day (QID) | ORAL | Status: DC | PRN

## 2011-08-21 ENCOUNTER — Encounter: Payer: Medicare Other | Admitting: Internal Medicine

## 2011-08-21 ENCOUNTER — Other Ambulatory Visit: Payer: Self-pay | Admitting: Internal Medicine

## 2011-09-23 ENCOUNTER — Other Ambulatory Visit: Payer: Self-pay | Admitting: *Deleted

## 2011-09-23 DIAGNOSIS — M25551 Pain in right hip: Secondary | ICD-10-CM

## 2011-09-23 MED ORDER — TRAMADOL HCL 50 MG PO TABS: 50.0000 mg | ORAL_TABLET | Freq: Four times a day (QID) | ORAL | Status: DC | PRN

## 2011-10-07 ENCOUNTER — Encounter: Payer: Medicare Other | Admitting: Internal Medicine

## 2011-10-09 ENCOUNTER — Other Ambulatory Visit: Payer: Self-pay | Admitting: Internal Medicine

## 2011-10-30 ENCOUNTER — Other Ambulatory Visit: Payer: Self-pay | Admitting: Internal Medicine

## 2011-11-14 ENCOUNTER — Other Ambulatory Visit: Payer: Self-pay | Admitting: *Deleted

## 2011-11-14 DIAGNOSIS — I10 Essential (primary) hypertension: Secondary | ICD-10-CM

## 2011-11-14 MED ORDER — ENALAPRIL MALEATE 10 MG PO TABS: 20.0000 mg | ORAL_TABLET | Freq: Every day | ORAL | Status: DC

## 2011-12-16 ENCOUNTER — Other Ambulatory Visit: Payer: Self-pay | Admitting: *Deleted

## 2011-12-16 DIAGNOSIS — M25551 Pain in right hip: Secondary | ICD-10-CM

## 2011-12-17 MED ORDER — TRAMADOL HCL 50 MG PO TABS: 50.0000 mg | ORAL_TABLET | Freq: Four times a day (QID) | ORAL | Status: DC | PRN

## 2012-01-06 ENCOUNTER — Ambulatory Visit (INDEPENDENT_AMBULATORY_CARE_PROVIDER_SITE_OTHER): Payer: Medicare Other | Admitting: Internal Medicine

## 2012-01-06 ENCOUNTER — Encounter: Payer: Self-pay | Admitting: Internal Medicine

## 2012-01-06 VITALS — BP 195/95 | HR 75 | Temp 98.6°F | Ht 64.0 in | Wt 137.3 lb

## 2012-01-06 DIAGNOSIS — F0390 Unspecified dementia without behavioral disturbance: Secondary | ICD-10-CM

## 2012-01-06 DIAGNOSIS — I10 Essential (primary) hypertension: Secondary | ICD-10-CM

## 2012-01-06 DIAGNOSIS — E785 Hyperlipidemia, unspecified: Secondary | ICD-10-CM

## 2012-01-06 DIAGNOSIS — Z79899 Other long term (current) drug therapy: Secondary | ICD-10-CM

## 2012-01-06 DIAGNOSIS — E119 Type 2 diabetes mellitus without complications: Secondary | ICD-10-CM

## 2012-01-06 DIAGNOSIS — Z23 Encounter for immunization: Secondary | ICD-10-CM

## 2012-01-06 DIAGNOSIS — I2510 Atherosclerotic heart disease of native coronary artery without angina pectoris: Secondary | ICD-10-CM

## 2012-01-06 DIAGNOSIS — I509 Heart failure, unspecified: Secondary | ICD-10-CM

## 2012-01-06 DIAGNOSIS — F068 Other specified mental disorders due to known physiological condition: Secondary | ICD-10-CM

## 2012-01-06 DIAGNOSIS — Z0000 Encounter for general adult medical examination without abnormal findings: Secondary | ICD-10-CM

## 2012-01-06 LAB — POCT GLYCOSYLATED HEMOGLOBIN (HGB A1C): Hemoglobin A1C: 6.9

## 2012-01-06 LAB — GLUCOSE, CAPILLARY: Glucose-Capillary: 326 mg/dL — ABNORMAL HIGH (ref 70–99)

## 2012-01-06 MED ORDER — RIVASTIGMINE 13.3 MG/24HR TD PT24: 1.0000 | MEDICATED_PATCH | Freq: Once | TRANSDERMAL | Status: DC

## 2012-01-06 MED ORDER — GLUCERNA SHAKE PO LIQD: 237.0000 mL | Freq: Three times a day (TID) | ORAL | Status: DC

## 2012-01-06 NOTE — Patient Instructions (Signed)
Today we have increased the Exelon patch dosage today (13.3 mg). I have also sent a prescription for Glucerna shakes to help with her nutrition and appetite. The flu shot was given today. A referral was made to the Podiatrist as you requested. Continue to takes all of the medications as before. Return to see me in 3-4 months.

## 2012-01-07 DIAGNOSIS — Z0000 Encounter for general adult medical examination without abnormal findings: Secondary | ICD-10-CM | POA: Insufficient documentation

## 2012-01-07 NOTE — Progress Notes (Signed)
  Subjective:    Patient ID: [name], female    DOB: 01/17/1932, 78 y.o.   MRN: [id number]  HPI  Hx significant for dementia, diabetes mellitus Type 2, and hypertension. Presents with two daughters for f/u.  Daughters report that there is still concern that pt is not getting adequate meal at home where she lives with son and another daughter.  The other daughter works and thus son is left to care for [name].  This care has been question previously by the daughters.= in terms of if pt is receiving adequate meals and medications in a timely manner.  They are requesting a Podiatry referral for pt's toe nails which need to be trimmed and maintained given her diabetes. She is down 13 pounds over the course of this year.  Hemoglobin A1c demonstrates good control at 6.9,. Glucometer log is without hypoglycemic recordings with most although many >200 and at least several >300.  Review of Systems  Constitutional: Negative.   HENT: Negative.   Eyes: Negative.   Respiratory: Negative for cough, chest tightness and shortness of breath.   Cardiovascular: Negative for chest pain, palpitations and leg swelling.  Gastrointestinal: Negative for nausea, abdominal pain, diarrhea and constipation.  Genitourinary: Negative.   Musculoskeletal: Negative.   Neurological: Negative for dizziness, seizures and numbness.  Psychiatric/Behavioral: Positive for confusion and decreased concentration. Negative for hallucinations and agitation.       Objective:   Physical Exam  Constitutional: She appears well-developed and well-nourished.       Elderly AA female with daughters present  HENT:  Head: Normocephalic and atraumatic.  Eyes: Conjunctivae normal and EOM are normal. Pupils are equal, round, and reactive to light.  Neck: Normal range of motion. Neck supple.  Cardiovascular: Normal rate, regular rhythm and normal heart sounds.   Pulmonary/Chest: Effort normal and breath sounds normal. She has no  wheezes. She exhibits no tenderness.  Abdominal: Soft. Bowel sounds are normal. There is no tenderness.  Neurological: She is alert. She has normal strength. She is not disoriented.       Oriented to self and family, doesn't know her age  Skin: Skin is warm and dry.          Assessment & Plan:  1. Diabetes Mellitus: cont with current regimen of Lantus 20 units qd with meal coverage.  -Will start Glucerna shakes for added nutrition.  2. Hypertension: above goal today, unlcear if pt received antihypertensives today thus will defer adjustment -cont amlodipine 10 mg qd,enalalpril 20 mg, and lasix 440 mg qd  3. Dementia: stable on Exelon patch 9.5 mg/d, daughter requesting higher dosage, pt without side effects thus far.  Advised that patch likely will not improve dementia but may help to prevent worsening, warned concerning side effects of nausea, vomiting, diarrhea, abd pain, etc and to contact clinic if these occur  4. Preventative Care: given flu shot today

## 2012-01-07 NOTE — Assessment & Plan Note (Signed)
Increase Exelon patch to 13.3 mg/d

## 2012-01-07 NOTE — Assessment & Plan Note (Signed)
Needs diabetic foot care including nail trimming per Podiatry

## 2012-01-23 ENCOUNTER — Telehealth: Payer: Self-pay | Admitting: *Deleted

## 2012-01-23 NOTE — Telephone Encounter (Signed)
CALLED [facility], LEFT VOICE MESSAGE FOR HER TO CALL THE CLINIC. PATIENT HAS NOT RETURNED CALL TO [facility] TO SET UP APPT. LELA STURDIVANT NT  11-14-013   2:19PM

## 2012-01-27 ENCOUNTER — Other Ambulatory Visit: Payer: Self-pay | Admitting: *Deleted

## 2012-01-27 DIAGNOSIS — M25551 Pain in right hip: Secondary | ICD-10-CM

## 2012-01-28 MED ORDER — TRAMADOL HCL 50 MG PO TABS: 50.0000 mg | ORAL_TABLET | Freq: Four times a day (QID) | ORAL | Status: DC | PRN

## 2012-02-11 NOTE — Addendum Note (Signed)
Addended by: Neomia Dear on: 02/11/2012 06:54 PM   Modules accepted: Orders

## 2012-03-06 ENCOUNTER — Other Ambulatory Visit: Payer: Self-pay | Admitting: *Deleted

## 2012-03-06 DIAGNOSIS — M25551 Pain in right hip: Secondary | ICD-10-CM

## 2012-03-06 NOTE — Telephone Encounter (Signed)
Last filled 02/11/12 Qty #30. 

## 2012-03-07 MED ORDER — TRAMADOL HCL 50 MG PO TABS: 50.0000 mg | ORAL_TABLET | Freq: Four times a day (QID) | ORAL | Status: DC | PRN

## 2012-03-17 ENCOUNTER — Other Ambulatory Visit: Payer: Self-pay | Admitting: Internal Medicine

## 2012-04-15 ENCOUNTER — Other Ambulatory Visit: Payer: Self-pay | Admitting: Internal Medicine

## 2012-05-04 ENCOUNTER — Ambulatory Visit (INDEPENDENT_AMBULATORY_CARE_PROVIDER_SITE_OTHER): Payer: Medicare Other | Admitting: Internal Medicine

## 2012-05-04 VITALS — BP 173/104 | HR 67 | Temp 98.8°F | Ht 62.3 in | Wt 137.0 lb

## 2012-05-04 DIAGNOSIS — E119 Type 2 diabetes mellitus without complications: Secondary | ICD-10-CM

## 2012-05-04 DIAGNOSIS — F0390 Unspecified dementia without behavioral disturbance: Secondary | ICD-10-CM | POA: Insufficient documentation

## 2012-05-04 MED ORDER — RIVASTIGMINE 13.3 MG/24HR TD PT24: 1.0000 | MEDICATED_PATCH | Freq: Once | TRANSDERMAL | Status: DC

## 2012-05-04 MED ORDER — INSULIN LISPRO 100 UNIT/ML ~~LOC~~ SOLN: 2.0000 [IU] | Freq: Three times a day (TID) | SUBCUTANEOUS | Status: DC

## 2012-05-04 MED ORDER — INSULIN GLARGINE 100 UNIT/ML ~~LOC~~ SOLN: 15.0000 [IU] | Freq: Every day | SUBCUTANEOUS | Status: DC

## 2012-05-04 MED ORDER — INSULIN GLARGINE 100 UNIT/ML ~~LOC~~ SOLN: 18.0000 [IU] | Freq: Every day | SUBCUTANEOUS | Status: DC

## 2012-05-04 MED ORDER — INSULIN LISPRO 100 UNIT/ML ~~LOC~~ SOLN: SUBCUTANEOUS | Status: DC

## 2012-05-04 NOTE — Progress Notes (Signed)
  Subjective:    Patient ID: [name], female    DOB: [birth year], 79 y.o.   MRN: [id number]  HPI  Hx significant for htn, dementia, cva, and diabetes.  Presents for follow-up.  Review of Systems  Constitutional: Negative.   HENT: Negative.   Respiratory: Negative for shortness of breath.   Genitourinary: Negative.   Neurological: Negative for dizziness, weakness and light-headedness.  Psychiatric/Behavioral: Positive for decreased concentration.       Objective:   Physical Exam  Constitutional: She appears well-developed and well-nourished. No distress.  Elderly AA female in wheelchair, Daughter [name] present  HENT:  Head: Normocephalic and atraumatic.  Eyes: Conjunctivae and EOM are normal. Pupils are equal, round, and reactive to light.  Neck: Normal range of motion. Neck supple.  Cardiovascular: Normal rate, regular rhythm and normal heart sounds.   Pulmonary/Chest: Effort normal and breath sounds normal.  Abdominal: Soft. Bowel sounds are normal. There is no tenderness.  Musculoskeletal: Normal range of motion. She exhibits no edema and no tenderness.  Neurological: She is alert.  Oriented to self and family  Skin: Skin is warm and dry.  Psychiatric: She has a normal mood and affect. Her behavior is normal.          Assessment & Plan:  1. DM: controlled with HgbA1c 6.5, this is a little lower than needed for her age, will decrease Lantus  From 20 units to 15 units and increase meal time coverage sliding scale given the several hypoglycemic recordings in the 50s with cbgs ~200s during most of the day -diabetic footwear prescribed  2. Dementia: stable on Exelon patch  3. Hypertension: pt continues to be above goal, again has not taken her blood pressure medication including no amlodipine, enalapril, no Lasix.  Daughter [name] states that daughter [name] usually gives pt medications in the evening.  I expressed concern that she is very elevated and shoul take  medication during the day especially given her prior cva and continued cva risk  4. Prior cva: on plavix  5. Dispo: pt to return in 3-4 months

## 2012-05-04 NOTE — Assessment & Plan Note (Signed)
Patient has had none of her antihypertensive medications today including no amlodipine, enalapril, or Lasix. Her daughter was with her today reports that she often receives his medication in the evening time once the daughter that she lives with his kids home. I have counseled the patient that it will be best to have her blood pressure medications in the morning so she does not run elevated for most of the day increasing her risk of stroke.

## 2012-05-04 NOTE — Assessment & Plan Note (Addendum)
Include an A1c at goal today at 6.5. I have reviewed her CBG logs it appears that she's had multiple episodes of low blood sugars in the 50s in the morning time the 20 7:30 AM and 9 AM. She runs relatively high for most of the day in the high 100s to mid 200s with occasional 300 CBG. Will increase her sliding scale insulin for meals and decrease Lantus from 20 units to 15 units. I have filled out paperwork for diabetic shoes. Patient saw the podiatrist November 2013.

## 2012-05-04 NOTE — Patient Instructions (Addendum)
General Instructions: You have had several low blood sugars in the morning. With increases throughout the day. Drink the Glucerna instead of Ensure so that it will not increase your blood sugar as much. We have decreased the Lantus from 20 units at night to 15 units at night. We have made changes in the insulin that you take with your meals: BG 150-199 give 4 unit, BG 200-249 5 units, BG 250-299 6 units, BG 300-349 7 units, BG 350 and up, 8 units and call clinic [phone] Refrain from a lot of fried foods. We have also sent the information for the Diabetic Footwear. Follow-up with me in 4 months.

## 2012-05-19 ENCOUNTER — Telehealth: Payer: Self-pay | Admitting: *Deleted
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
## 2012-05-27 ENCOUNTER — Other Ambulatory Visit: Payer: Self-pay | Admitting: Internal Medicine

## 2012-06-15 ENCOUNTER — Other Ambulatory Visit: Payer: Self-pay | Admitting: Internal Medicine

## 2012-06-15 ENCOUNTER — Other Ambulatory Visit: Payer: Self-pay | Admitting: *Deleted

## 2012-06-15 DIAGNOSIS — E119 Type 2 diabetes mellitus without complications: Secondary | ICD-10-CM

## 2012-06-15 MED ORDER — GLUCOSE BLOOD VI STRP: ORAL_STRIP | Status: DC

## 2012-06-15 NOTE — Telephone Encounter (Signed)
done

## 2012-06-15 NOTE — Telephone Encounter (Signed)
Pt is out of stirps, can you please refill?

## 2012-06-29 ENCOUNTER — Other Ambulatory Visit: Payer: Self-pay | Admitting: *Deleted

## 2012-06-29 DIAGNOSIS — I10 Essential (primary) hypertension: Secondary | ICD-10-CM

## 2012-07-01 ENCOUNTER — Telehealth: Payer: Self-pay | Admitting: *Deleted

## 2012-07-01 ENCOUNTER — Other Ambulatory Visit: Payer: Self-pay | Admitting: Internal Medicine

## 2012-07-01 DIAGNOSIS — I10 Essential (primary) hypertension: Secondary | ICD-10-CM

## 2012-07-01 MED ORDER — ENALAPRIL MALEATE 10 MG PO TABS: 20.0000 mg | ORAL_TABLET | Freq: Every day | ORAL | Status: DC

## 2012-07-01 MED ORDER — AMLODIPINE BESYLATE 10 MG PO TABS: ORAL_TABLET | ORAL | Status: DC

## 2012-07-01 MED ORDER — FUROSEMIDE 40 MG PO TABS: ORAL_TABLET | ORAL | Status: DC

## 2012-07-01 NOTE — Telephone Encounter (Signed)
Done

## 2012-07-01 NOTE — Telephone Encounter (Signed)
Call from pt daughter stating pt is out of BP meds. Also her mother is staying at another facility and they need a updated medication list.  Please review pt's med list so I can print out and give to daughter.    Harrison Mons - [phone]

## 2012-07-07 ENCOUNTER — Encounter: Payer: Self-pay | Admitting: Licensed Clinical Social Worker

## 2012-07-07 ENCOUNTER — Encounter: Payer: Self-pay | Admitting: Radiation Oncology

## 2012-07-07 ENCOUNTER — Ambulatory Visit (INDEPENDENT_AMBULATORY_CARE_PROVIDER_SITE_OTHER): Payer: Medicare Other | Admitting: Radiation Oncology

## 2012-07-07 VITALS — BP 165/82 | HR 82 | Temp 98.3°F | Ht 64.0 in | Wt 135.4 lb

## 2012-07-07 DIAGNOSIS — E119 Type 2 diabetes mellitus without complications: Secondary | ICD-10-CM

## 2012-07-07 DIAGNOSIS — I10 Essential (primary) hypertension: Secondary | ICD-10-CM

## 2012-07-07 DIAGNOSIS — F0390 Unspecified dementia without behavioral disturbance: Secondary | ICD-10-CM

## 2012-07-07 DIAGNOSIS — F068 Other specified mental disorders due to known physiological condition: Secondary | ICD-10-CM

## 2012-07-07 NOTE — Patient Instructions (Signed)
General instructions:  Please make an appointment to see Dr. Bosie Clos in approximately 2 months for a follow-up visit.   Please let us know if you need any further assistance in setting up home services to assist your family with your mother's care.   It was a pleasure to meet you both. Have a great day.

## 2012-07-07 NOTE — Progress Notes (Signed)
CSW received telephone call from pt's dau, [name].  Dau states pt is currently living with family and is in need of additional services.  Pt has [facility] Medicare and requires 24hr assistance for safety.  CSW discussed possibility of applying for Medicaid for the Aged, but pt may have significant resources, will leave information for family.  There are multiple services that are available on a private pay basis.  CSW left information on PACE, medical alert, ACE/Adult Day Care, Private Duty/Respite care listing along with CSW contact information and hours.  Information left with nursing staff. Family encouraged to contact CSW should they have add'l information or questions regarding resources/services in the area.

## 2012-07-08 NOTE — Progress Notes (Signed)
Subjective:    Patient ID: [name], female    DOB: 05-11-1931, 79 y.o.   MRN: [id number]  Diabetes   Patient is a 79 year old woman with dementia and PMH otherwise as described below who presents to clinic today with daughter [name] who is concerned the patient might be losing weight. The patient has no complaints today and denies any recent changes in appetite. The daughter has no other concerns today, but requests information on available assistance programs to help in their family's care of their mother at home, given her advanced dementia and need for 24 hour supervision.  Review of Systems  All other systems reviewed and are negative.   Current Outpatient Medications: Current Outpatient Prescriptions  Medication Sig Dispense Refill  . amLODipine (NORVASC) 10 MG tablet TAKE ONE (1) TABLET EACH DAY  30 tablet  6  . B-D ULTRAFINE III SHORT PEN 31G X 8 MM MISC USE AS DIRECTED WITH INSULIN PENS  100 each  11  . Blood Glucose Monitoring Suppl (ACCU-CHEK COMPACT CARE KIT) KIT 1 Device by Does not apply route 3 (three) times daily.  1 each  0  . calcitRIOL (ROCALTROL) 0.25 MCG capsule TAKE ONE (1) CAPSULE EACH DAY  30 capsule  11  . clopidogrel (PLAVIX) 75 MG tablet TAKE ONE (1) TABLET EACH DAY  30 tablet  6  . enalapril (VASOTEC) 10 MG tablet Take 1 tablet (10 mg total) by mouth daily.  30 tablet  0  . enalapril (VASOTEC) 10 MG tablet Take 2 tablets (20 mg total) by mouth daily.  60 tablet  6  . feeding supplement (GLUCERNA SHAKE) LIQD Take 237 mLs by mouth 3 (three) times daily between meals.  24 Can  6  . furosemide (LASIX) 40 MG tablet TAKE ONE (1) TABLET EACH DAY  30 tablet  6  . glucose blood (ACCU-CHEK AVIVA) test strip Use to check blood sugar three times a day  100 each  6  . insulin glargine (LANTUS SOLOSTAR) 100 UNIT/ML injection Inject 15 Units into the skin at bedtime.  15 mL  6  . insulin glargine (LANTUS) 100 UNIT/ML injection Inject 20 Units into the skin at bedtime.       . insulin lispro (HUMALOG KWIKPEN) 100 UNIT/ML injection BG 150-199 give 4 unit, BG 200-249 5 units, BG 250-299 6 units, BG 300-349 7 units, BG 350 and up, 8 units and call clinic [phone]  15 mL  11  . Multiple Vitamin (MULITIVITAMIN WITH MINERALS) TABS Take 1 tablet by mouth daily.      . pravastatin (PRAVACHOL) 40 MG tablet Take 40 mg by mouth every evening.      . pravastatin (PRAVACHOL) 40 MG tablet TAKE ONE (1) TABLET EACH                EVENING  30 tablet  3  . Rivastigmine 13.3 MG/24HR PT24 Place 1 patch (13.3 mg total) onto the skin once.  30 patch  6  . traMADol (ULTRAM) 50 MG tablet Take 1 tablet (50 mg total) by mouth every 6 (six) hours as needed for pain.  30 tablet  6   No current facility-administered medications for this visit.    Allergies: No Known Allergies   Past Medical History: Past Medical History  Diagnosis Date  . Hyperlipidemia   . Hypertension   . Diabetes mellitus   . Chronic kidney disease     stage IV  . RAS (renal artery stenosis)   . Stroke  s/p CVA unknown timing  . Ischemic cardiomyopathy     EF 20%  . Dementia   . CAD (coronary artery disease)     s/p PTCA May 2005, stenting of an intermittent blanch of the CMX and the angioplasty of teh diagonal branch to the left descending    Past Surgical History: Past Surgical History  Procedure Laterality Date  . Appendectomy    . Total abdominal hysterectomy w/ bilateral salpingoophorectomy    . Angioplasty      and stent    Family History: Family History  Problem Relation Age of Onset  . Kidney disease Brother     Social History: History   Social History  . Marital Status: Married    Spouse Name: N/A    Number of Children: N/A  . Years of Education: N/A   Occupational History  . Not on file.   Social History Main Topics  . Smoking status: Never Smoker   . Smokeless tobacco: Never Used  . Alcohol Use: No  . Drug Use: No  . Sexually Active: Not on file   Other Topics  Concern  . Not on file   Social History Narrative   Patient gets regular exercise- walking   Lives with husband and son; son manages medications      No FH of colon cancer     Vital Signs: Blood pressure 165/82, pulse 82, temperature 98.3 F (36.8 C), temperature source Oral, height 5\' 4"  (1.626 m), weight 135 lb 6.4 oz (61.417 kg), SpO2 100.00%.      Objective:   Physical Exam  Constitutional: She appears well-developed and well-nourished. No distress.  Sitting comfortably in wheelchair.   HENT:  Head: Normocephalic and atraumatic.  Eyes: Conjunctivae are normal. Pupils are equal, round, and reactive to light. No scleral icterus.  Neck: Normal range of motion. Neck supple. No tracheal deviation present.  Cardiovascular: Normal rate and regular rhythm.   No murmur heard. Pulmonary/Chest: Effort normal. She has no wheezes. She has no rales.  Abdominal: Soft. Bowel sounds are normal. She exhibits no distension. There is no tenderness.  Musculoskeletal: Normal range of motion. She exhibits no edema.  Neurological: No cranial nerve deficit.  Oriented to person.   Skin: Skin is warm and dry.  Psychiatric: She has a normal mood and affect. Her behavior is normal.          Assessment & Plan:

## 2012-07-08 NOTE — Assessment & Plan Note (Signed)
BP Readings from Last 3 Encounters:  07/07/12 165/82  05/04/12 173/104  01/06/12 195/95    Lab Results  Component Value Date   NA 137 07/17/2011   K 3.4* 07/17/2011   CREATININE 1.85* 07/17/2011    Assessment: Blood pressure control: mildly elevated Progress toward BP goal:  unchanged Comments: pt's daughter unsure if pt has taken meds today which is the likely cause of her elevated BP.   Plan: Medications:  continue current medications Educational resources provided:   Self management tools provided:   Other plans: f/u with Dr. Bosie Clos in 2 months to reassess

## 2012-07-08 NOTE — Assessment & Plan Note (Signed)
Lab Results  Component Value Date   HGBA1C 6.5 05/04/2012   HGBA1C 6.9 01/06/2012   HGBA1C 7.2 06/18/2011     Assessment: Diabetes control: good control (HgbA1C at goal) Progress toward A1C goal:  at goal Comments: pt's daughter reports that glucose meter had been left at the house.   Plan: Medications:  continue current medications Home glucose monitoring: Frequency:   Timing:   Instruction/counseling given: reminded to bring blood glucose meter & log to each visit Educational resources provided:   Self management tools provided:   Other plans: f/u with Dr. Bosie Clos in ~2 months to address previous concerns of AM hypotension.

## 2012-07-08 NOTE — Assessment & Plan Note (Signed)
Weight stable over the last 6 months. No GI symptoms. Daughter [name] who is present during the visit states that it was her sister who had actually been concerned about the weight, and that she had not noticed any changes herself. [name] reports that due to the patient's dementia, she sometimes chews her food and then takes it out of her mouth and puts it in her pocket, which may have been the reason for the weight concerns.  - information given on home assistance options

## 2012-07-10 ENCOUNTER — Other Ambulatory Visit: Payer: Self-pay | Admitting: *Deleted

## 2012-07-10 MED ORDER — CALCITRIOL 0.25 MCG PO CAPS: 0.2500 ug | ORAL_CAPSULE | Freq: Every day | ORAL | Status: DC

## 2012-07-13 NOTE — Progress Notes (Signed)
INTERNAL MEDICINE TEACHING ATTENDING ADDENDUM - Inez Catalina, MD: I reviewed with the resident Dr. Lavena Bullion, Ms. Hoelzer's  medical history, physical examination, diagnosis and results of tests and treatment and I agree with the patient's care as documented.  Will need close follow up for her BP with Dr. Bosie Clos.

## 2012-07-15 ENCOUNTER — Other Ambulatory Visit: Payer: Self-pay | Admitting: *Deleted

## 2012-07-15 MED ORDER — PRAVASTATIN SODIUM 40 MG PO TABS: ORAL_TABLET | ORAL | Status: DC

## 2012-09-17 ENCOUNTER — Other Ambulatory Visit: Payer: Self-pay

## 2012-10-02 ENCOUNTER — Other Ambulatory Visit: Payer: Self-pay | Admitting: *Deleted

## 2012-10-02 MED ORDER — INSULIN PEN NEEDLE 31G X 8 MM MISC: Status: DC

## 2012-10-02 NOTE — Telephone Encounter (Signed)
Pt is out of needles, please refill for her.

## 2012-10-02 NOTE — Telephone Encounter (Signed)
Has Aug appt

## 2012-10-05 ENCOUNTER — Other Ambulatory Visit: Payer: Self-pay | Admitting: *Deleted

## 2012-10-05 ENCOUNTER — Encounter: Payer: Medicare Other | Admitting: Internal Medicine

## 2012-10-05 DIAGNOSIS — E119 Type 2 diabetes mellitus without complications: Secondary | ICD-10-CM

## 2012-10-05 DIAGNOSIS — M25551 Pain in right hip: Secondary | ICD-10-CM

## 2012-10-05 MED ORDER — INSULIN GLARGINE 100 UNIT/ML ~~LOC~~ SOLN: 15.0000 [IU] | Freq: Every day | SUBCUTANEOUS | Status: DC

## 2012-10-05 MED ORDER — TRAMADOL HCL 50 MG PO TABS: 50.0000 mg | ORAL_TABLET | Freq: Four times a day (QID) | ORAL | Status: DC | PRN

## 2012-10-06 NOTE — Telephone Encounter (Signed)
Tramadol rx faxed to [facility].

## 2012-10-12 ENCOUNTER — Ambulatory Visit (INDEPENDENT_AMBULATORY_CARE_PROVIDER_SITE_OTHER): Payer: Medicare Other | Admitting: Internal Medicine

## 2012-10-12 ENCOUNTER — Encounter: Payer: Self-pay | Admitting: Internal Medicine

## 2012-10-12 VITALS — BP 145/96 | HR 81 | Temp 97.8°F | Ht 62.75 in | Wt 135.5 lb

## 2012-10-12 DIAGNOSIS — E119 Type 2 diabetes mellitus without complications: Secondary | ICD-10-CM

## 2012-10-12 DIAGNOSIS — E785 Hyperlipidemia, unspecified: Secondary | ICD-10-CM

## 2012-10-12 DIAGNOSIS — I10 Essential (primary) hypertension: Secondary | ICD-10-CM

## 2012-10-12 LAB — GLUCOSE, CAPILLARY: Glucose-Capillary: 159 mg/dL — ABNORMAL HIGH (ref 70–99)

## 2012-10-12 LAB — POCT GLYCOSYLATED HEMOGLOBIN (HGB A1C): Hemoglobin A1C: 6.9

## 2012-10-12 MED ORDER — GLUCOSE BLOOD VI STRP: ORAL_STRIP | Status: DC

## 2012-10-12 NOTE — Assessment & Plan Note (Addendum)
BP Readings from Last 3 Encounters:  10/12/12 145/96  07/07/12 165/82  05/04/12 173/104    Lab Results  Component Value Date   NA 137 07/17/2011   K 3.4* 07/17/2011   CREATININE 1.85* 07/17/2011    Assessment: Blood pressure control: controlled Progress toward BP goal:  at goal Comments: BP only mildly elevated.  Can tolerate a more liberal BP goal in this patient.  Plan: Medications:  continue current medications Educational resources provided: brochure;handout;video Self management tools provided:   Other plans: Recheck at next visit

## 2012-10-12 NOTE — Patient Instructions (Signed)
General Instructions: For your diabetes, your Hemoglobin A1C is 6.9, which is fantastic.  However, I'm concerned about your episodes of low blood sugar. -DECREASE your Lantus (long-acting) insulin to 10 units once per day -CONTINUE your Humalog (short-acting) insulin on your current sliding scale  Please return for a follow-up visit in 3-4 months.   Treatment Goals:  Goals (1 Years of Data) as of 10/12/12   None      Progress Toward Treatment Goals:  Treatment Goal 10/12/2012  Hemoglobin A1C at goal  Blood pressure at goal    Self Care Goals & Plans:  Self Care Goal 10/12/2012  Manage my medications take my medicines as prescribed; bring my medications to every visit; refill my medications on time; follow the sick day instructions if I am sick  Monitor my health keep track of my blood glucose; keep track of my blood pressure; check my feet daily  Eat healthy foods eat more vegetables; eat fruit for snacks and desserts; eat foods that are low in salt; drink diet soda or water instead of juice or soda  Be physically active take a walk every day; find an activity I enjoy    Home Blood Glucose Monitoring 10/12/2012  Check my blood sugar 3 times a day  When to check my blood sugar before meals     Care Management & Community Referrals:  Referral 10/12/2012  Referrals made for care management support none needed  Referrals made to community resources -

## 2012-10-12 NOTE — Assessment & Plan Note (Addendum)
Lab Results  Component Value Date   HGBA1C 6.9 10/12/2012   HGBA1C 6.5 05/04/2012   HGBA1C 6.9 01/06/2012     Assessment: Diabetes control: good control (HgbA1C at goal) Progress toward A1C goal:  at goal Comments: The patient's A1C is at goal.  However, she is having occasional hypoglycemia, which is concerning.  I would tolerate a higher A1C goal (ie < 8) to avoid hypoglycemic episodes.  Plan: Medications:  Decrease Lantus from 15 units to 10 units daily.  Continue Humalog TID on her current sliding scale, 4-8 units. Home glucose monitoring: Frequency: 3 times a day Timing: before meals Instruction/counseling given: reminded to bring blood glucose meter & log to each visit Educational resources provided: brochure;handout Self management tools provided:   Other plans: Patient recently had an eye exam at [facility].  Will refer to podiatry for toenail care

## 2012-10-12 NOTE — Progress Notes (Signed)
HPI The patient is a 79 y.o. female with a history of dementia, DM2, CAD, CHF, CKD, presenting for a follow-up visit.  The patient has a history of DM.  She brings a blood glucose log, which shows mostly well-controlled blood sugars, but about 3 episodes of hypoglycemia in the last month with CBG's in the 50's, and also several episodes of hyperglycemia, with blood sugars as high as the low 300's.  The patient is using Lantus 15, as well as a sliding scale short-acting insulin.  ROS: General: no fevers, chills, changes in weight, changes in appetite Skin: no rash HEENT: no blurry vision, hearing changes, sore throat Pulm: no dyspnea, coughing, wheezing CV: no chest pain, palpitations, shortness of breath Abd: no abdominal pain, nausea/vomiting, diarrhea/constipation GU: no dysuria, hematuria, polyuria Ext: no arthralgias, myalgias Neuro: no weakness, numbness, or tingling  Filed Vitals:   10/12/12 1511  BP: 145/96  Pulse: 81  Temp: 97.8 F (36.6 C)    PEX General: alert, cooperative, and in no apparent distress HEENT: pupils equal round and reactive to light, vision grossly intact, oropharynx clear and non-erythematous  Neck: supple, no lymphadenopathy Lungs: clear to ascultation bilaterally, normal work of respiration, no wheezes, rales, ronchi Heart: regular rate and rhythm, no murmurs, gallops, or rubs Abdomen: soft, non-tender, non-distended, normal bowel sounds Extremities: no cyanosis, clubbing, or edema Neurologic: alert & oriented X3, cranial nerves II-XII intact, strength grossly intact, sensation intact to light touch  Current Outpatient Prescriptions on File Prior to Visit  Medication Sig Dispense Refill  . amLODipine (NORVASC) 10 MG tablet TAKE ONE (1) TABLET EACH DAY  30 tablet  6  . Blood Glucose Monitoring Suppl (ACCU-CHEK COMPACT CARE KIT) KIT 1 Device by Does not apply route 3 (three) times daily.  1 each  0  . calcitRIOL (ROCALTROL) 0.25 MCG capsule Take 1  capsule (0.25 mcg total) by mouth daily.  90 capsule  1  . clopidogrel (PLAVIX) 75 MG tablet TAKE ONE (1) TABLET EACH DAY  30 tablet  6  . enalapril (VASOTEC) 10 MG tablet Take 1 tablet (10 mg total) by mouth daily.  30 tablet  0  . enalapril (VASOTEC) 10 MG tablet Take 2 tablets (20 mg total) by mouth daily.  60 tablet  6  . feeding supplement (GLUCERNA SHAKE) LIQD Take 237 mLs by mouth 3 (three) times daily between meals.  24 Can  6  . furosemide (LASIX) 40 MG tablet TAKE ONE (1) TABLET EACH DAY  30 tablet  6  . glucose blood (ACCU-CHEK AVIVA) test strip Use to check blood sugar three times a day  100 each  6  . insulin glargine (LANTUS) 100 UNIT/ML injection Inject 20 Units into the skin at bedtime.      . insulin glargine (LANTUS) 100 UNIT/ML injection Inject 0.15 mLs (15 Units total) into the skin at bedtime.  15 mL  6  . insulin lispro (HUMALOG KWIKPEN) 100 UNIT/ML injection BG 150-199 give 4 unit, BG 200-249 5 units, BG 250-299 6 units, BG 300-349 7 units, BG 350 and up, 8 units and call clinic [phone]  15 mL  11  . Insulin Pen Needle (B-D ULTRAFINE III SHORT PEN) 31G X 8 MM MISC USE AS DIRECTED WITH INSULIN PENS  100 each  2  . Multiple Vitamin (MULITIVITAMIN WITH MINERALS) TABS Take 1 tablet by mouth daily.      . pravastatin (PRAVACHOL) 40 MG tablet Take 40 mg by mouth every evening.      Marland Kitchen  pravastatin (PRAVACHOL) 40 MG tablet TAKE ONE (1) TABLET EACH                EVENING  30 tablet  6  . Rivastigmine 13.3 MG/24HR PT24 Place 1 patch (13.3 mg total) onto the skin once.  30 patch  6  . traMADol (ULTRAM) 50 MG tablet Take 1 tablet (50 mg total) by mouth every 6 (six) hours as needed for pain.  30 tablet  5   No current facility-administered medications on file prior to visit.    Assessment/Plan

## 2012-10-13 LAB — LIPID PANEL
Cholesterol: 192 mg/dL (ref 0–200)
HDL: 48 mg/dL (ref >39)
Total CHOL/HDL Ratio: 4 Ratio
Triglycerides: 124 mg/dL (ref <150)

## 2012-10-13 NOTE — Progress Notes (Signed)
Case discussed with Dr. Brown at the time of the visit.  We reviewed the resident's history and exam and pertinent patient test results.  I agree with the assessment, diagnosis, and plan of care documented in the resident's note. 

## 2012-10-29 ENCOUNTER — Encounter: Payer: Self-pay | Admitting: *Deleted

## 2012-10-30 ENCOUNTER — Encounter: Payer: Self-pay | Admitting: Gastroenterology

## 2012-11-04 ENCOUNTER — Other Ambulatory Visit: Payer: Self-pay | Admitting: *Deleted

## 2012-11-04 MED ORDER — CLOPIDOGREL BISULFATE 75 MG PO TABS: 75.0000 mg | ORAL_TABLET | Freq: Every day | ORAL | Status: DC

## 2012-12-31 ENCOUNTER — Other Ambulatory Visit: Payer: Self-pay | Admitting: *Deleted

## 2012-12-31 DIAGNOSIS — E119 Type 2 diabetes mellitus without complications: Secondary | ICD-10-CM

## 2013-01-01 MED ORDER — INSULIN LISPRO 100 UNIT/ML ~~LOC~~ SOLN: SUBCUTANEOUS | Status: DC

## 2013-01-20 ENCOUNTER — Encounter: Payer: Self-pay | Admitting: Internal Medicine

## 2013-02-03 ENCOUNTER — Other Ambulatory Visit: Payer: Self-pay | Admitting: Internal Medicine

## 2013-02-13 ENCOUNTER — Other Ambulatory Visit: Payer: Self-pay | Admitting: Internal Medicine

## 2013-02-22 ENCOUNTER — Encounter: Payer: Self-pay | Admitting: Internal Medicine

## 2013-02-22 ENCOUNTER — Ambulatory Visit (INDEPENDENT_AMBULATORY_CARE_PROVIDER_SITE_OTHER): Payer: Medicare Other | Admitting: Internal Medicine

## 2013-02-22 VITALS — BP 174/101 | HR 79 | Temp 97.7°F | Ht 62.75 in | Wt 136.6 lb

## 2013-02-22 DIAGNOSIS — N184 Chronic kidney disease, stage 4 (severe): Secondary | ICD-10-CM

## 2013-02-22 DIAGNOSIS — E119 Type 2 diabetes mellitus without complications: Secondary | ICD-10-CM

## 2013-02-22 DIAGNOSIS — F0390 Unspecified dementia without behavioral disturbance: Secondary | ICD-10-CM

## 2013-02-22 DIAGNOSIS — I10 Essential (primary) hypertension: Secondary | ICD-10-CM

## 2013-02-22 DIAGNOSIS — I509 Heart failure, unspecified: Secondary | ICD-10-CM

## 2013-02-22 DIAGNOSIS — Z23 Encounter for immunization: Secondary | ICD-10-CM

## 2013-02-22 LAB — RENAL FUNCTION PANEL
BUN: 28 mg/dL — ABNORMAL HIGH (ref 6–23)
Calcium: 9.2 mg/dL (ref 8.4–10.5)
Chloride: 99 mEq/L (ref 96–112)
Creat: 1.95 mg/dL — ABNORMAL HIGH (ref 0.50–1.10)
Phosphorus: 4.6 mg/dL (ref 2.3–4.6)
Potassium: 3.8 mEq/L (ref 3.5–5.3)

## 2013-02-22 LAB — POCT GLYCOSYLATED HEMOGLOBIN (HGB A1C): Hemoglobin A1C: 7.2

## 2013-02-22 LAB — GLUCOSE, CAPILLARY: Glucose-Capillary: 230 mg/dL — ABNORMAL HIGH (ref 70–99)

## 2013-02-22 NOTE — Patient Instructions (Signed)
We will STOP your humalog or regular insulin. This is the mealtime insulin. STOP taking it and throw it away.   We will CONTINUE your lantus 15 units at night time. This is the only insulin you should be taking. Check your sugars in the morning but STOP checking your sugars at mealtimes.   We would like to know what medicines she is taking so please call us with the names. We would like her to go back to her kidney doctor, Dr. Eliott Nine. Her office's number is [phone]. Please call and make an appointment.  We are checking some blood tests today to find out how her kidneys are doing. If the kidneys are doing worse it may be that she doesn't have a long time left before the kidneys stop working well enough to do their job. You should probably talk about it as a family.   Sleep problems or change of day and night is common in older people and especially with dementia and things you can do to help is to keep her busy and awake during the day. You may not be able to help her to sleep at night time and this can be very stressful.   Come back in 1 month to talk about her kidneys and her blood pressure.   Call us if you need [place] at [phone].   Dementia Dementia is a word that is used to describe problems with the brain and how it works. People with dementia have memory loss. They may also have problems with thinking, speaking, or solving problems. It can affect how they act around people, how they do their job, their mood, and their personality. These changes may not show up for a long time. Family or friends may not notice problems in the early part of this disease. HOME CARE The following tips are for the person living with, or caring for, the person with dementia. Make the home safe.  Remove locks on bathroom doors.  Use childproof locks on cabinets where alcohol, cleaning supplies, or chemicals are stored.  Put outlet covers in electrical outlets.  Put in childproof locks to keep doors and  windows safe.  Remove stove knobs, or put in safety knobs that shut off on their own.  Lower the temperature on water heaters.  Label medicines. Lock them in a safe place.  Keep knives, lighters, matches, power tools, and guns out of reach or in a safe place.  Remove objects that might break or can hurt the person.  Make sure lighting is good inside and outside.  Put in grab bars if needed.  Use a device that detects falls or other needs for help. Lessen confusion.  Keep familiar objects and people around.  Use night lights or low lit (dim) lights at night.  Label objects or areas.  Use reminders, notes, or directions for daily activities or tasks.  Keep a simple routine that is the same for waking, meals, bathing, dressing, and bedtime.  Create a calm and quiet home.  Put up clocks and calendars.  Keep emergency numbers and the home address near all phones.  Help show the different times of day. Open the curtains during the day to let light in. Speak clearly and directly.  Choose simple words and short sentences.  Use a gentle, calm voice.  Do not interrupt.  If the person has a hard time finding a word to use, give them the word or thought.  Ask 1 question at a time. Give  enough time for the person to answer. Repeat the question if the person does not answer. Do things that lessen restlessness.  Provide a comfortable bed.  Have the same bedtime routine every night.  Have a regular walking and activity schedule.  Lessen naps during the day.  Do not let the person drink a lot of caffeine.  Go to events that are not overwhelming. Eat well and drink fluids.  Lessen distractions during meal times and snacks.  Avoid foods that are too hot or too cold.  Watch how the person chews and swallows. This is to make sure they do not choke. Other  Keep all vision, hearing, dental, and medical visits with the doctor.  Only give medicines as told by the  doctor.  Watch the person's driving ability. Do not let the person drive if he or she cannot drive safely.  Use a program that helps find a person if they become missing. You may need to register with this program. GET HELP RIGHT AWAY IF:   A fever of 102 F (38.9 C) develops.  Confusion develops or gets worse.  Sleepiness develops or gets worse.  Staying awake is hard to do.  New behavior problems start like mood swings, aggression, and seeing things that are not there.  Problems with balance, speech, or falling develop.  Problems swallowing develop.  Any problems of another sickness develop. MAKE SURE YOU:  Understand these instructions.  Will watch his or her condition.  Will get help right away if he or she is not doing well or gets worse. Document Released: 02/08/2008 Document Revised: 05/20/2011 Document Reviewed: 07/23/2010 [facility] Patient Information 2014 [place], [place].

## 2013-02-22 NOTE — Progress Notes (Signed)
Subjective:     Patient ID: [name], female   DOB: [birth year], 79 y.o.   MRN: [id number]  HPI The patient is a 79 YO female with history of dementia, HTN, DM type II, CAD with stents in 2004, CKD stage IV who comes in for a follow up. She is quite late to the visit as she got lost in the hospital. She is not having headache, nausea, vomiting, change in mental status. She is still having quite a bit of confusion and some reversal of sleep wake cycle and sleeps a lot during the day with not too much sleep at night time which is stressful on her caregivers. She is present with her daughter today although she is not the primary caregiver and does not know much information about her mother's condition. Spoke briefly with another daughter on the phone although she was also not the primary caregiver and not familiar with a lot of the medical information about her mother.   Review of Systems  Constitutional: Negative for fever, chills, diaphoresis, activity change, appetite change, fatigue and unexpected weight change.  Respiratory: Negative for cough, chest tightness, shortness of breath and wheezing.   Cardiovascular: Negative for chest pain, palpitations and leg swelling.  Gastrointestinal: Negative for nausea, vomiting, abdominal pain, diarrhea and constipation.  Neurological: Negative for dizziness, tremors, seizures, syncope, facial asymmetry, speech difficulty, weakness, light-headedness, numbness and headaches.  Psychiatric/Behavioral: Positive for behavioral problems, confusion, sleep disturbance and decreased concentration.       Objective:   Physical Exam  Vitals reviewed. Constitutional: She appears well-developed and well-nourished. No distress.  Pleasantly demented but can answer basic questions about how she is feeling but short term memory is not intact.   HENT:  Head: Normocephalic and atraumatic.  Eyes: EOM are normal. Pupils are equal, round, and reactive to light.  Neck:  Normal range of motion. Neck supple. No JVD present. No tracheal deviation present. No thyromegaly present.  Cardiovascular: Normal rate and regular rhythm.   Pulmonary/Chest: Effort normal and breath sounds normal. No respiratory distress. She has no wheezes. She has no rales.  Abdominal: Soft. Bowel sounds are normal. She exhibits no distension. There is no tenderness. There is no rebound.  Musculoskeletal:  Patient unable to walk long distances and used chair to get around clinic and to/from the car  Lymphadenopathy:    She has no cervical adenopathy.  Neurological: She is alert. No cranial nerve deficit.  Skin: Skin is warm and dry. She is not diaphoretic.       Assessment/Plan:   1. Please see problem oriented charting.  2. Disposition - she will return in early January with her PCP for follow up. She will have renal function panel today. She should be seeing nephrology. Her blood pressure is high today and her HgA1c is in a reasonable range. Would discontinue her sliding scale insulin as her dementia is likely making this more a hazard than a benefit at this time. Called [facility] to try to find out the last time she was in the office as our last note in the computer is from 3/13 and they stated that she had not been there since July 2013. Have asked daughter to provide medication list so we can adjust BP meds as her BP is elevated today although she is asymptomatic at this time. She got flu shot at today's visit.

## 2013-02-23 ENCOUNTER — Telehealth: Payer: Self-pay | Admitting: *Deleted

## 2013-02-23 NOTE — Telephone Encounter (Signed)
Call from [name], pt's daughter - Stated Dr Dorise Hiss requested pt's med list:  1. Enalapril 10mg  - 2 tabs daily  2. Calcitrol 0.25mg   2x a week  3. Clopidogrel 75mg   4. Amlodipine  10mg  qd 5. Furosemide 40mg  daily  6. Pravastatin 40mg  daily  7. Sodium 40mg . Thanks

## 2013-02-24 MED ORDER — METOPROLOL SUCCINATE ER 50 MG PO TB24: 50.0000 mg | ORAL_TABLET | Freq: Every day | ORAL | Status: DC

## 2013-02-24 NOTE — Assessment & Plan Note (Signed)
BP Readings from Last 3 Encounters:  02/22/13 174/101  10/12/12 145/96  07/07/12 165/82    Lab Results  Component Value Date   NA 136 02/22/2013   K 3.8 02/22/2013   CREATININE 1.95* 02/22/2013    Assessment: Blood pressure control:  Poor Progress toward BP goal:   Deteriorated Comments:   Plan: Medications:  continue vasotec 20 mg daily and lasix 40 mg daily and norvasc 10 mg daily. Will add beta blocker as she has CAD as well. Educational resources provided: Comptroller tools provided:   Other plans:

## 2013-02-24 NOTE — Progress Notes (Signed)
Case discussed with Dr. Kollar at the time of the visit.  We reviewed the resident's history and exam and pertinent patient test results.  I agree with the assessment, diagnosis, and plan of care documented in the resident's note.     

## 2013-02-24 NOTE — Assessment & Plan Note (Signed)
STOPPED humalog at today's visit due to likely confusion about regimen at home. Continue lantus 15 units QHS. NO hypoglycemia that her daughter could describe. She seems to be doing well with HgA1c in goal today. Would make her goal <8.

## 2013-02-24 NOTE — Assessment & Plan Note (Signed)
Not using the patch, seems to be advancing and care situation is unclear to me as primary caregiver was not here today.

## 2013-02-24 NOTE — Assessment & Plan Note (Signed)
No signs of volume overload on exam.

## 2013-02-24 NOTE — Assessment & Plan Note (Signed)
Creatinine stable at visit and advised to call [facility] for appointment as she has not been there since July 2013. Phosphorus not high, bicarb not low. She is currently taking calcitriol 3 times weekly. Will add BP med as this is the only intervention at this time. Not a good candidate for dialysis and spent >10 minutes discussing this with 2 different daughter's at today's visit.

## 2013-02-24 NOTE — Telephone Encounter (Addendum)
Thanks will use this to adjust her regimen. I have ordered metoprolol-xl 50 mg daily for her blood pressure. Would you please call back her daughter and let her know to pick it up and take 1 pill in the morning daily until she returns.   Thanks!  Dr. Dorise Hiss

## 2013-02-25 NOTE — Telephone Encounter (Signed)
[name] informed of new med, Metoprolol-Xl, per Dr Dorise Hiss.

## 2013-03-18 ENCOUNTER — Other Ambulatory Visit: Payer: Self-pay | Admitting: *Deleted

## 2013-03-18 MED ORDER — PRAVASTATIN SODIUM 40 MG PO TABS: ORAL_TABLET | ORAL | Status: DC

## 2013-03-22 ENCOUNTER — Ambulatory Visit: Payer: Medicare Other | Admitting: Internal Medicine

## 2013-03-25 ENCOUNTER — Ambulatory Visit (INDEPENDENT_AMBULATORY_CARE_PROVIDER_SITE_OTHER): Payer: Medicare Other | Admitting: Internal Medicine

## 2013-03-25 ENCOUNTER — Encounter: Payer: Self-pay | Admitting: Internal Medicine

## 2013-03-25 VITALS — BP 204/97 | HR 62 | Temp 97.1°F | Ht 62.75 in | Wt 135.1 lb

## 2013-03-25 DIAGNOSIS — I10 Essential (primary) hypertension: Secondary | ICD-10-CM

## 2013-03-25 DIAGNOSIS — E119 Type 2 diabetes mellitus without complications: Secondary | ICD-10-CM

## 2013-03-25 LAB — GLUCOSE, CAPILLARY: GLUCOSE-CAPILLARY: 371 mg/dL — AB (ref 70–99)

## 2013-03-25 NOTE — Patient Instructions (Signed)
Please find out when the medicines were last filled and how many pills are left in each bottle.   Amlodipine    Metoprolol   Enalapril   Furosemide  I will call [name] tomorrow to find out this information. At that time, I may make changes to these medicines to achieve better control of your blood pressure.   Please follow up in 3 weeks with PCP.

## 2013-03-26 ENCOUNTER — Telehealth: Payer: Self-pay | Admitting: Internal Medicine

## 2013-03-26 NOTE — Telephone Encounter (Signed)
I spoke with a family member of the patients who sated that they had a death in the family today. Thus, the patients daughter was unable to get the information about the patients pill bottles. I plan to contact [name] early next week about the patients pill bottles to establish compliance before adjusting her anti-htn regimen.

## 2013-03-28 ENCOUNTER — Emergency Department (HOSPITAL_COMMUNITY)
Admission: EM | Admit: 2013-03-28 | Discharge: 2013-03-29 | Disposition: A | Discharge status: Home / Self Care | Payer: Medicare Other | Attending: Emergency Medicine | Admitting: Emergency Medicine

## 2013-03-28 ENCOUNTER — Encounter (HOSPITAL_COMMUNITY): Payer: Self-pay | Admitting: Emergency Medicine

## 2013-03-28 DIAGNOSIS — Z794 Long term (current) use of insulin: Secondary | ICD-10-CM | POA: Insufficient documentation

## 2013-03-28 DIAGNOSIS — Z79899 Other long term (current) drug therapy: Secondary | ICD-10-CM | POA: Insufficient documentation

## 2013-03-28 DIAGNOSIS — E119 Type 2 diabetes mellitus without complications: Secondary | ICD-10-CM | POA: Insufficient documentation

## 2013-03-28 DIAGNOSIS — Z8673 Personal history of transient ischemic attack (TIA), and cerebral infarction without residual deficits: Secondary | ICD-10-CM | POA: Insufficient documentation

## 2013-03-28 DIAGNOSIS — N189 Chronic kidney disease, unspecified: Secondary | ICD-10-CM | POA: Insufficient documentation

## 2013-03-28 DIAGNOSIS — E785 Hyperlipidemia, unspecified: Secondary | ICD-10-CM | POA: Insufficient documentation

## 2013-03-28 DIAGNOSIS — F0390 Unspecified dementia without behavioral disturbance: Secondary | ICD-10-CM | POA: Insufficient documentation

## 2013-03-28 DIAGNOSIS — L03319 Cellulitis of trunk, unspecified: Principal | ICD-10-CM

## 2013-03-28 DIAGNOSIS — L0291 Cutaneous abscess, unspecified: Secondary | ICD-10-CM

## 2013-03-28 DIAGNOSIS — I701 Atherosclerosis of renal artery: Secondary | ICD-10-CM | POA: Insufficient documentation

## 2013-03-28 DIAGNOSIS — I2510 Atherosclerotic heart disease of native coronary artery without angina pectoris: Secondary | ICD-10-CM | POA: Insufficient documentation

## 2013-03-28 DIAGNOSIS — I129 Hypertensive chronic kidney disease with stage 1 through stage 4 chronic kidney disease, or unspecified chronic kidney disease: Secondary | ICD-10-CM | POA: Insufficient documentation

## 2013-03-28 DIAGNOSIS — L02219 Cutaneous abscess of trunk, unspecified: Secondary | ICD-10-CM | POA: Insufficient documentation

## 2013-03-28 MED ORDER — CLONIDINE HCL 0.2 MG PO TABS
0.2000 mg | ORAL_TABLET | Freq: Once | ORAL | Status: AC
  Administered 2013-03-28: 0.2 mg via ORAL
  Filled 2013-03-28: qty 1

## 2013-03-28 NOTE — ED Provider Notes (Signed)
CSN: [id number]     Arrival date & time 03/28/13  1821 History   First MD Initiated Contact with Patient 03/28/13 2233     Chief Complaint  Patient presents with  . Abscess   (Consider location/radiation/quality/duration/timing/severity/associated sxs/prior Treatment) HPI This patient is a 80 yo woman who is BIB her daughter because she has a tender mass over her right upper back. Family noticed foul smelling drainage today. Patient notes some discomfort in the area when she is in a recumbent or supine position. No fever. No h/o same.   Past Medical History  Diagnosis Date  . Hyperlipidemia   . Hypertension   . Diabetes mellitus   . Chronic kidney disease     stage IV  . RAS (renal artery stenosis)   . Stroke     s/p CVA unknown timing  . Ischemic cardiomyopathy     EF 20%  . Dementia   . CAD (coronary artery disease)     s/p PTCA May 2005, stenting of an intermittent blanch of the CMX and the angioplasty of teh diagonal branch to the left descending   Past Surgical History  Procedure Laterality Date  . Appendectomy    . Total abdominal hysterectomy w/ bilateral salpingoophorectomy    . Angioplasty      and stent   Family History  Problem Relation Age of Onset  . Kidney disease Brother    History  Substance Use Topics  . Smoking status: Never Smoker   . Smokeless tobacco: Never Used  . Alcohol Use: No   OB History   Grav Para Term Preterm Abortions TAB SAB Ect Mult Living                 Review of Systems Ten point review of symptoms performed and is negative with the exception of symptoms noted above.   Allergies  Review of patient's allergies indicates no known allergies.  Home Medications   Current Outpatient Rx  Name  Route  Sig  Dispense  Refill  . amLODipine (NORVASC) 10 MG tablet      TAKE ONE (1) TABLET EACH DAY   30 tablet   5   . Blood Glucose Monitoring Suppl (ACCU-CHEK COMPACT CARE KIT) KIT   Does not apply   1 Device by Does not apply  route 3 (three) times daily.   1 each   0   . calcitRIOL (ROCALTROL) 0.25 MCG capsule   Oral   Take 1 capsule (0.25 mcg total) by mouth daily.   90 capsule   1   . clopidogrel (PLAVIX) 75 MG tablet   Oral   Take 1 tablet (75 mg total) by mouth daily.   90 tablet   1   . enalapril (VASOTEC) 10 MG tablet   Oral   Take 2 tablets (20 mg total) by mouth daily.   60 tablet   6   . feeding supplement (GLUCERNA SHAKE) LIQD   Oral   Take 237 mLs by mouth 3 (three) times daily between meals.   24 Can   6     Prefers vanilla   . furosemide (LASIX) 40 MG tablet      TAKE 1 TABLET BY MOUTH EVERY DAY   30 tablet   5   . glucose blood (ACCU-CHEK AVIVA) test strip      Use to check blood sugar three times a day   100 each   11   . insulin glargine (LANTUS) 100 UNIT/ML injection  Subcutaneous   Inject 0.15 mLs (15 Units total) into the skin at bedtime.   15 mL   6     PLEASE RESPOND THANKS!   . Insulin Pen Needle (B-D ULTRAFINE III SHORT PEN) 31G X 8 MM MISC      USE AS DIRECTED WITH INSULIN PENS   100 each   2   . metoprolol succinate (TOPROL-XL) 50 MG 24 hr tablet   Oral   Take 1 tablet (50 mg total) by mouth daily. Take with or immediately following a meal.   30 tablet   1   . Multiple Vitamin (MULITIVITAMIN WITH MINERALS) TABS   Oral   Take 1 tablet by mouth daily.         . pravastatin (PRAVACHOL) 40 MG tablet      TAKE ONE (1) TABLET EACH                EVENING   30 tablet   6   . Rivastigmine 13.3 MG/24HR PT24   Transdermal   Place 1 patch (13.3 mg total) onto the skin once.   30 patch   6    BP 156/111  Pulse 69  Temp(Src) 98 F (36.7 C) (Oral)  Resp 18  SpO2 99% Physical Exam Gen: well developed and well nourished appearing Head: NCAT Eyes: PERL, EOMI Nose: Normal to inspection Mouth/throat: mucosa is moist and pink Neck: Normal to inspection Lungs: CTA B, no wheezing, rhonchi or rales CV: RRR, no murmur, extremities appear well  perfused.  Abd: soft, notender Back: cystic mass measuring approx 6cm in max diameter over the right upper back, mild ttp, caseous discharged can be expressed. No erythema or surrounding erythema or induration.  Skin: warm and dry Ext: normal to inspection, no dependent edema Neuro: CN ii-xii grossly intact, no focal deficits Psyche; normal affect,  calm and cooperative.  ED Course  Procedures (including critical care time) Labs Review  MDM  Infected dermoid cyst.  Please see MLP note for incision and drainage.     Elyn Peers, MD 03/29/13 [phone]

## 2013-03-28 NOTE — ED Notes (Signed)
Pt presents to department for evaluation of possible abscess to R upper back. Daughter noticed foul smelling raised area today. White colored drainage noted at site. Pt also noted to be hypertensive upon arrival to ED. Pt is alert and oriented x4.

## 2013-03-28 NOTE — ED Notes (Signed)
Noted a large abscess to left back shoulder, two small raised to mid back area. Daughter stated some drainage with foul odor from the large abscess. site is tender to touch.

## 2013-03-29 NOTE — ED Provider Notes (Signed)
I supervised this procedure.   Elyn Peers, MD 03/29/13 [phone]

## 2013-03-29 NOTE — ED Provider Notes (Signed)
INCISION AND DRAINAGE Performed by: Lorrine Kin Consent: Verbal consent obtained. Risks and benefits: risks, benefits and alternatives were discussed.  The abscess was evaluated by [place] and imaging was saved.  Type: abscess  Body area: Right upper back/scapula  Anesthesia: local infiltration  Incision was made with a scalpel.  Local anesthetic: lidocaine 2% with epinephrine  Anesthetic total: 13 ml  Complexity: complex Blunt dissection to break up loculations  Drainage: purulent  Drainage amount: approximately 20cc  Wound was irrigated with sterile saline.  Packing material: 1/4 in iodoform gauze  Patient tolerance: Patient tolerated the procedure well with no immediate complications.     Lorrine Kin, PA-C 03/29/13 [phone]

## 2013-03-29 NOTE — Discharge Instructions (Signed)
Call for a follow up appointment with a Family or Primary Care Provider in 2-3 days for a wound check and to remove the packing material.  Return if Symptoms worsen, if you develop a fever or have increased redness to the area.   Take medication as prescribed.  Abscess An abscess is an infected area that contains a collection of pus and debris.It can occur in almost any part of the body. An abscess is also known as a furuncle or boil. CAUSES  An abscess occurs when tissue gets infected. This can occur from blockage of oil or sweat glands, infection of hair follicles, or a minor injury to the skin. As the body tries to fight the infection, pus collects in the area and creates pressure under the skin. This pressure causes pain. People with weakened immune systems have difficulty fighting infections and get certain abscesses more often.  SYMPTOMS Usually an abscess develops on the skin and becomes a painful mass that is red, warm, and tender. If the abscess forms under the skin, you may feel a moveable soft area under the skin. Some abscesses break open (rupture) on their own, but most will continue to get worse without care. The infection can spread deeper into the body and eventually into the bloodstream, causing you to feel ill.  DIAGNOSIS  Your caregiver will take your medical history and perform a physical exam. A sample of fluid may also be taken from the abscess to determine what is causing your infection. TREATMENT  Your caregiver may prescribe antibiotic medicines to fight the infection. However, taking antibiotics alone usually does not cure an abscess. Your caregiver may need to make a small cut (incision) in the abscess to drain the pus. In some cases, gauze is packed into the abscess to reduce pain and to continue draining the area. HOME CARE INSTRUCTIONS   Only take over-the-counter or prescription medicines for pain, discomfort, or fever as directed by your caregiver.  If you were  prescribed antibiotics, take them as directed. Finish them even if you start to feel better.  If gauze is used, follow your caregiver's directions for changing the gauze.  To avoid spreading the infection:  Keep your draining abscess covered with a bandage.  Wash your hands well.  Do not share personal care items, towels, or whirlpools with others.  Avoid skin contact with others.  Keep your skin and clothes clean around the abscess.  Keep all follow-up appointments as directed by your caregiver. SEEK MEDICAL CARE IF:   You have increased pain, swelling, redness, fluid drainage, or bleeding.  You have muscle aches, chills, or a general ill feeling.  You have a fever. MAKE SURE YOU:   Understand these instructions.  Will watch your condition.  Will get help right away if you are not doing well or get worse. Document Released: 12/05/2004 Document Revised: 08/27/2011 Document Reviewed: 05/10/2011 [facility] Patient Information 2014 [place].

## 2013-04-01 ENCOUNTER — Ambulatory Visit (INDEPENDENT_AMBULATORY_CARE_PROVIDER_SITE_OTHER): Payer: Medicare Other | Admitting: Internal Medicine

## 2013-04-01 ENCOUNTER — Encounter: Payer: Self-pay | Admitting: Internal Medicine

## 2013-04-01 VITALS — BP 207/100 | HR 76 | Temp 97.2°F | Ht 62.5 in | Wt 134.2 lb

## 2013-04-01 DIAGNOSIS — L02212 Cutaneous abscess of back [any part, except buttock]: Secondary | ICD-10-CM

## 2013-04-01 DIAGNOSIS — L03319 Cellulitis of trunk, unspecified: Secondary | ICD-10-CM

## 2013-04-01 DIAGNOSIS — L02219 Cutaneous abscess of trunk, unspecified: Secondary | ICD-10-CM

## 2013-04-01 DIAGNOSIS — I10 Essential (primary) hypertension: Secondary | ICD-10-CM

## 2013-04-01 MED ORDER — FUROSEMIDE 40 MG PO TABS: 60.0000 mg | ORAL_TABLET | Freq: Every day | ORAL | Status: DC

## 2013-04-01 NOTE — Assessment & Plan Note (Signed)
S/p I&D on 03/28/13 in the ED - packing removed with still some purulent material but not significant surrounding fluctuance.  I repacked wound, and instructed family to repack every 24-48h, once unable to pack, clean with soap and water - return on Monday for I&D of two other back abscesses.

## 2013-04-01 NOTE — Patient Instructions (Signed)
-  Continue to change packing every 24-48 hours, do not over pack.  Once you cannot pack any more, clean with soap and water  -Regarding blood pressure, increase lasix to 60mg  daily (1.5 pills)  Please be sure to bring all of your medications with you to every visit.  Should you have any new or worsening symptoms, please be sure to call the clinic at [phone].

## 2013-04-01 NOTE — Assessment & Plan Note (Signed)
BP Readings from Last 3 Encounters:  04/01/13 207/100  03/29/13 144/94  03/25/13 204/97    Lab Results  Component Value Date   NA 136 02/22/2013   K 3.8 02/22/2013   CREATININE 1.95* 02/22/2013    Assessment: Blood pressure control:  severely elevated Progress toward BP goal:   deteriorated Comments: Patient has med assistance from the people she lives with  Plan: Medications:  increase lasix to 60 daily, cont enalapril 20, metoprolol 50, amlodipine 10 Educational resources provided: brochure;handout;video BMET at follow up

## 2013-04-01 NOTE — Progress Notes (Signed)
Case discussed with Dr. Sharda soon after the resident saw the patient.  We reviewed the resident's history and exam and pertinent patient test results.  I agree with the assessment, diagnosis, and plan of care documented in the resident's note. 

## 2013-04-01 NOTE — Progress Notes (Signed)
Subjective:   Patient ID: [name] female   DOB: 14-Jun-1931 80 y.o.   MRN: [id number]  Chief Complaint  Patient presents with  . Follow-up    FU ER right upper back.    HPI: [name] is a 80 y.o. woman with history of DM, HTN, CAD, sCHF, renal artery stenosis, CKD IV, CVA, severe dementia who presents for ED follow up.  She is eager to leave.  Right upper back abscess was I&D in ED on 03/28/13 - thought to be an infected dermoid cyst.  They return today to have packing removed.  Regarding HTN, patient has been compliant with meds.   Review of Systems: ROS not done given severe dementia; daughter has noticed that she occasionally complains of dizziness/HA   Past Medical History  Diagnosis Date  . Hyperlipidemia   . Hypertension   . Diabetes mellitus   . Chronic kidney disease     stage IV  . RAS (renal artery stenosis)   . Stroke     s/p CVA unknown timing  . Ischemic cardiomyopathy     EF 20%  . Dementia   . CAD (coronary artery disease)     s/p PTCA May 2005, stenting of an intermittent blanch of the CMX and the angioplasty of teh diagonal branch to the left descending   Current Outpatient Prescriptions  Medication Sig Dispense Refill  . amLODipine (NORVASC) 10 MG tablet TAKE ONE (1) TABLET EACH DAY  30 tablet  5  . Blood Glucose Monitoring Suppl (ACCU-CHEK COMPACT CARE KIT) KIT 1 Device by Does not apply route 3 (three) times daily.  1 each  0  . calcitRIOL (ROCALTROL) 0.25 MCG capsule Take 1 capsule (0.25 mcg total) by mouth daily.  90 capsule  1  . clopidogrel (PLAVIX) 75 MG tablet Take 1 tablet (75 mg total) by mouth daily.  90 tablet  1  . enalapril (VASOTEC) 10 MG tablet Take 2 tablets (20 mg total) by mouth daily.  60 tablet  6  . feeding supplement (GLUCERNA SHAKE) LIQD Take 237 mLs by mouth 3 (three) times daily between meals.  24 Can  6  . furosemide (LASIX) 40 MG tablet TAKE 1 TABLET BY MOUTH EVERY DAY  30 tablet  5  . glucose blood  (ACCU-CHEK AVIVA) test strip Use to check blood sugar three times a day  100 each  11  . insulin glargine (LANTUS) 100 UNIT/ML injection Inject 0.15 mLs (15 Units total) into the skin at bedtime.  15 mL  6  . Insulin Pen Needle (B-D ULTRAFINE III SHORT PEN) 31G X 8 MM MISC USE AS DIRECTED WITH INSULIN PENS  100 each  2  . metoprolol succinate (TOPROL-XL) 50 MG 24 hr tablet Take 1 tablet (50 mg total) by mouth daily. Take with or immediately following a meal.  30 tablet  1  . Multiple Vitamin (MULITIVITAMIN WITH MINERALS) TABS Take 1 tablet by mouth daily.      . pravastatin (PRAVACHOL) 40 MG tablet TAKE ONE (1) TABLET EACH                EVENING  30 tablet  6  . Rivastigmine 13.3 MG/24HR PT24 Place 1 patch (13.3 mg total) onto the skin once.  30 patch  6   No current facility-administered medications for this visit.   Family History  Problem Relation Age of Onset  . Kidney disease Brother    History   Social History  .  Marital Status: Married    Spouse Name: N/A    Number of Children: N/A  . Years of Education: N/A   Social History Main Topics  . Smoking status: Never Smoker   . Smokeless tobacco: Never Used  . Alcohol Use: No  . Drug Use: No  . Sexual Activity: None   Other Topics Concern  . None   Social History Narrative   Patient gets regular exercise- walking   Lives with husband and son; son manages medications      No FH of colon cancer    Objective:  Physical Exam: Filed Vitals:   04/01/13 1524  BP: 207/100  Pulse: 76  Temp: 97.2 F (36.2 C)  TempSrc: Oral  Height: 5' 2.5" (1.588 m)  Weight: 134 lb 3.2 oz (60.873 kg)  SpO2: 99%   HEENT: PERRL, EOMI, no scleral icterus Cardiac: RRR, no rubs, murmurs or gallops Pulm: clear to auscultation bilaterally, moving normal volumes of air Abd: soft, nontender, nondistended, BS present Back: right scapular region incision wound clean, about 2cm long, packing removed with purulent drainage; two other 2.5 cm  abscesses palpable 1st on midback over spine, 2nd lateral to the spine, +fluctuant    Assessment & Plan:  Case and care discussed with Dr. Lynnae January.  Please see problem oriented charting for further details. Patient to return in 4 days for BP and I&D.

## 2013-04-02 MED ORDER — ENALAPRIL MALEATE 10 MG PO TABS: 20.0000 mg | ORAL_TABLET | Freq: Every day | ORAL | Status: DC

## 2013-04-02 NOTE — Progress Notes (Signed)
Subjective:   Patient ID: [name] female   DOB: [birth year] 80 y.o.   MRN: [id number]  HPI: [name] is a 80 y.o. woman with pmhx opf RAS s/p occluded stent in 2005 who presents for a BP recheck. See A and P for HTN for further charting.    Past Medical History  Diagnosis Date  . Hyperlipidemia   . Hypertension   . Diabetes mellitus   . Chronic kidney disease     stage IV  . RAS (renal artery stenosis)   . Stroke     s/p CVA unknown timing  . Ischemic cardiomyopathy     EF 20%  . Dementia   . CAD (coronary artery disease)     s/p PTCA May 2005, stenting of an intermittent blanch of the CMX and the angioplasty of teh diagonal branch to the left descending   Current Outpatient Prescriptions  Medication Sig Dispense Refill  . amLODipine (NORVASC) 10 MG tablet TAKE ONE (1) TABLET EACH DAY  30 tablet  5  . Blood Glucose Monitoring Suppl (ACCU-CHEK COMPACT CARE KIT) KIT 1 Device by Does not apply route 3 (three) times daily.  1 each  0  . calcitRIOL (ROCALTROL) 0.25 MCG capsule Take 1 capsule (0.25 mcg total) by mouth daily.  90 capsule  1  . clopidogrel (PLAVIX) 75 MG tablet Take 1 tablet (75 mg total) by mouth daily.  90 tablet  1  . enalapril (VASOTEC) 10 MG tablet Take 2 tablets (20 mg total) by mouth daily.  60 tablet  6  . feeding supplement (GLUCERNA SHAKE) LIQD Take 237 mLs by mouth 3 (three) times daily between meals.  24 Can  6  . glucose blood (ACCU-CHEK AVIVA) test strip Use to check blood sugar three times a day  100 each  11  . insulin glargine (LANTUS) 100 UNIT/ML injection Inject 0.15 mLs (15 Units total) into the skin at bedtime.  15 mL  6  . Insulin Pen Needle (B-D ULTRAFINE III SHORT PEN) 31G X 8 MM MISC USE AS DIRECTED WITH INSULIN PENS  100 each  2  . metoprolol succinate (TOPROL-XL) 50 MG 24 hr tablet Take 1 tablet (50 mg total) by mouth daily. Take with or immediately following a meal.  30 tablet  1  . Multiple Vitamin (MULITIVITAMIN WITH  MINERALS) TABS Take 1 tablet by mouth daily.      . pravastatin (PRAVACHOL) 40 MG tablet TAKE ONE (1) TABLET EACH                EVENING  30 tablet  6  . Rivastigmine 13.3 MG/24HR PT24 Place 1 patch (13.3 mg total) onto the skin once.  30 patch  6  . furosemide (LASIX) 40 MG tablet Take 1.5 tablets (60 mg total) by mouth daily.  30 tablet  5   No current facility-administered medications for this visit.   Family History  Problem Relation Age of Onset  . Kidney disease Brother    History   Social History  . Marital Status: Married    Spouse Name: N/A    Number of Children: N/A  . Years of Education: N/A   Social History Main Topics  . Smoking status: Never Smoker   . Smokeless tobacco: Never Used  . Alcohol Use: No  . Drug Use: No  . Sexual Activity: None   Other Topics Concern  . None   Social History Narrative   Patient gets regular exercise- walking  Lives with husband and son; son manages medications      No FH of colon cancer   Review of Systems: Review of Systems  Constitutional: Negative for fever, chills and malaise/fatigue.  HENT: Negative for sore throat.   Respiratory: Negative for cough, sputum production and shortness of breath.   Cardiovascular: Negative for chest pain, palpitations, orthopnea and leg swelling.  Gastrointestinal: Negative for heartburn, nausea, vomiting, abdominal pain, diarrhea and constipation.  Neurological: Negative for weakness and headaches.    Objective:  Physical Exam: Filed Vitals:   03/25/13 1514 03/25/13 1550  BP: 207/93 204/97  Pulse: 70 62  Temp: 97.1 F (36.2 C)   TempSrc: Oral   Height: 5' 2.75" (1.594 m)   Weight: 135 lb 1.6 oz (61.281 kg)   SpO2: 98%    Physical Exam  Constitutional: She appears well-developed and well-nourished. No distress.  Elderly appearing female. Pleasantly demented.   HENT:  Head: Normocephalic and atraumatic.  Cardiovascular: Normal rate, regular rhythm, normal heart sounds and  intact distal pulses.   No murmur heard. Pulmonary/Chest: Effort normal and breath sounds normal. No respiratory distress. She has no wheezes. She has no rales.  Abdominal: Soft. Bowel sounds are normal. She exhibits no distension. There is no tenderness.  Skin: She is not diaphoretic.    Assessment & Plan:

## 2013-04-02 NOTE — Assessment & Plan Note (Addendum)
BP Readings from Last 3 Encounters:  04/01/13 207/100  03/29/13 144/94  03/25/13 204/97    Lab Results  Component Value Date   NA 136 02/22/2013   K 3.8 02/22/2013   CREATININE 1.95* 02/22/2013    Assessment: Blood pressure control:  poor Progress toward BP goal:   deteriorated Comments: I have been unable to contact the family regarding medication compliance with Ms Hayton as apparently there was a death in the family and the family has been hard to get on the phone. I have called the pharmacy and she was likely out of her BP meds during my last visit despite the family member thinking that she was taking all of her medications. Furthermore, her last fill dates for enalapril and furosemide are 01-12-13 and 01-06-13 respectively.  Her previous fill dates are 10-10-12 and 08-24-12 for those medications respectively. Thus her compliance with enalapril and furosemide are likely poor.  Plan: Medications:  continue current medications. I am sending in new prescriptions for enalapril and furosemide. I am requesting the family to be contacted to pick up these prescriptions.  Other plans:  I am sending in new prescriptions for these to her pharmacy rather than increase her current meds. As she has a history of RAS that is now s/p stenting in 2004 that is now occluded, she may need further workup if her BP remains high while being compliant with her medications for contralateral stenosis. This will need to be addressed at the patients next visit.

## 2013-04-05 ENCOUNTER — Ambulatory Visit (INDEPENDENT_AMBULATORY_CARE_PROVIDER_SITE_OTHER): Payer: Medicare Other | Admitting: Internal Medicine

## 2013-04-05 ENCOUNTER — Encounter: Payer: Self-pay | Admitting: Internal Medicine

## 2013-04-05 VITALS — BP 161/87 | HR 59 | Temp 97.3°F | Ht 63.25 in | Wt 134.7 lb

## 2013-04-05 DIAGNOSIS — E119 Type 2 diabetes mellitus without complications: Secondary | ICD-10-CM

## 2013-04-05 DIAGNOSIS — D235 Other benign neoplasm of skin of trunk: Secondary | ICD-10-CM

## 2013-04-05 DIAGNOSIS — I10 Essential (primary) hypertension: Secondary | ICD-10-CM

## 2013-04-05 LAB — GLUCOSE, CAPILLARY: GLUCOSE-CAPILLARY: 449 mg/dL — AB (ref 70–99)

## 2013-04-05 NOTE — Patient Instructions (Signed)
-  Today we took out an infected cyst.  These cysts tend to recur, and because it wasn't a traditional "abscess" we decided that the other bump (lower down) should not be cut out.  If it is painful, you may try warm compresses 3 times per day on that area.  If it is still bothersome, let us know.  Please be sure to bring all of your medications with you to every visit.  Should you have any new or worsening symptoms, please be sure to call the clinic at [phone].

## 2013-04-05 NOTE — Assessment & Plan Note (Signed)
BP Readings from Last 3 Encounters:  04/05/13 161/87  04/01/13 207/100  03/29/13 144/94    Lab Results  Component Value Date   NA 136 02/22/2013   K 3.8 02/22/2013   CREATININE 1.95* 02/22/2013    Assessment: Blood pressure control:  mildly elevated Progress toward BP goal:   improved Comments: Patient's daugther brought in a paper prompted by Dr. Margart Sickles indicating when medications were filled and how many pills remaining: Amlodipine 02/16/13, #4 Metoprolol 02/24/13, #6 Enalapril 09/09/12, # 96 Furosemide 03/06/13, #30  Plan: Medications:  continue current medications, Dr. Margart Sickles refilled all scripts, and patient has one of her daughters who is her 74 [name], works at [employer]) who will follow up regarding BP med discrepancies  Educational resources provided: Therapist, sports tools provided:   Other plans: Dr. Margart Sickles noted that patient has h/o RAS s/p stending that is now occluded and may require further workup if BP remains elevated (before escalating care), but pressures are much improved today.

## 2013-04-05 NOTE — Assessment & Plan Note (Addendum)
S/p I&D of lumbar infected dermoid cyst (including excision of cyst wall).  A dermoid cyst is a sinus tract, that while removal is often incomplete, the outcome is usually excellent.  Once we recognized that this was not an abscess, we opted not to extract the 2nd cyst as it was quite small and not painful today. No packing needed given size of incision.  Please see procedure note for further details in "progress note" section.

## 2013-04-05 NOTE — Progress Notes (Signed)
Subjective:   Patient ID: [name] female   DOB: Jul 04, 1931 80 y.o.   MRN: [id number]  HPI: [name] is a 80 y.o. woman with history of DM, HTN, CAD, sCHF, renal artery stenosis, CKD IV, CVA, severe dementia who presents after being seen on 78/22/15 for I&D follow up of right upper back abscess.    Today she returns for I&D of 2 other back abscesses (TTP, fluctuant, expression of purulent matter per daughter)  BP is much improved today!  Review of Systems:   ROS not done given severe dementia   Past Medical History  Diagnosis Date  . Hyperlipidemia   . Hypertension   . Diabetes mellitus   . Chronic kidney disease     stage IV  . RAS (renal artery stenosis)   . Stroke     s/p CVA unknown timing  . Ischemic cardiomyopathy     EF 20%  . Dementia   . CAD (coronary artery disease)     s/p PTCA May 2005, stenting of an intermittent blanch of the CMX and the angioplasty of teh diagonal branch to the left descending   Current Outpatient Prescriptions  Medication Sig Dispense Refill  . amLODipine (NORVASC) 10 MG tablet TAKE ONE (1) TABLET EACH DAY  30 tablet  5  . Blood Glucose Monitoring Suppl (ACCU-CHEK COMPACT CARE KIT) KIT 1 Device by Does not apply route 3 (three) times daily.  1 each  0  . calcitRIOL (ROCALTROL) 0.25 MCG capsule Take 1 capsule (0.25 mcg total) by mouth daily.  90 capsule  1  . clopidogrel (PLAVIX) 75 MG tablet Take 1 tablet (75 mg total) by mouth daily.  90 tablet  1  . enalapril (VASOTEC) 10 MG tablet Take 2 tablets (20 mg total) by mouth daily.  60 tablet  6  . feeding supplement (GLUCERNA SHAKE) LIQD Take 237 mLs by mouth 3 (three) times daily between meals.  24 Can  6  . furosemide (LASIX) 40 MG tablet Take 1.5 tablets (60 mg total) by mouth daily.  30 tablet  5  . glucose blood (ACCU-CHEK AVIVA) test strip Use to check blood sugar three times a day  100 each  11  . insulin glargine (LANTUS) 100 UNIT/ML injection Inject 0.15 mLs (15 Units  total) into the skin at bedtime.  15 mL  6  . Insulin Pen Needle (B-D ULTRAFINE III SHORT PEN) 31G X 8 MM MISC USE AS DIRECTED WITH INSULIN PENS  100 each  2  . metoprolol succinate (TOPROL-XL) 50 MG 24 hr tablet Take 1 tablet (50 mg total) by mouth daily. Take with or immediately following a meal.  30 tablet  1  . Multiple Vitamin (MULITIVITAMIN WITH MINERALS) TABS Take 1 tablet by mouth daily.      . pravastatin (PRAVACHOL) 40 MG tablet TAKE ONE (1) TABLET EACH                EVENING  30 tablet  6  . Rivastigmine 13.3 MG/24HR PT24 Place 1 patch (13.3 mg total) onto the skin once.  30 patch  6   No current facility-administered medications for this visit.   Family History  Problem Relation Age of Onset  . Kidney disease Brother    History   Social History  . Marital Status: Married    Spouse Name: N/A    Number of Children: N/A  . Years of Education: N/A   Social History Main Topics  . Smoking status:  Never Smoker   . Smokeless tobacco: Never Used  . Alcohol Use: No  . Drug Use: No  . Sexual Activity: None   Other Topics Concern  . None   Social History Narrative   Patient gets regular exercise- walking   Lives with husband and son; son manages medications      No FH of colon cancer    Objective:  Physical Exam: Filed Vitals:   04/05/13 1556  BP: 161/87  Pulse: 59  Temp: 97.3 F (36.3 C)  TempSrc: Oral  Height: 5' 3.25" (1.607 m)  Weight: 134 lb 11.2 oz (61.1 kg)  SpO2: 98%   HEENT: PERRL, EOMI, no scleral icterus Cardiac: RRR, no rubs, murmurs or gallops Pulm: clear to auscultation bilaterally, moving normal volumes of air Abd: soft, nontender, nondistended, BS present Ext: warm and well perfused, no pedal edema Back: right scapular region incision wound clean & healed without palpable fluctuance, 2cm palpable fluctuant abscess (determined to be infected cyst after incision) over lumbar spine, 1cm similar mass a few inches below (did not cut this  one)  Incision & Drainage: Consent: Verbal consent by patient, but written consent by patient's daughter & son (who are her HCPOA, she has significant dementia)  Body area: lumbar spine Anesthesia: local infiltration of 1% lidocaine with 25g needle Incision made with scalpel Complex cyst with blunt dissection to remove foul smelling, purulent debris, with successful removal of cyst wall Wound was irrigated with sterile saline. No packing needed, wound protected with 4x4s Patient Tolerance: tolerated the procedure well with no immediate complications.    Assessment & Plan:   Case and care discussed with Dr. Lynnae January.  Please see problem oriented charting for further details. Patient to return in 1 month for BP follow up.

## 2013-04-06 ENCOUNTER — Encounter: Payer: Self-pay | Admitting: Internal Medicine

## 2013-04-10 NOTE — Progress Notes (Signed)
I saw and evaluated the patient.  I personally confirmed the key portions of the history and exam documented by Dr. Sharda and I reviewed pertinent patient test results.  The assessment, diagnosis, and plan were formulated together and I agree with the documentation in the resident's note. 

## 2013-04-30 ENCOUNTER — Other Ambulatory Visit: Payer: Self-pay | Admitting: Internal Medicine

## 2013-05-11 ENCOUNTER — Other Ambulatory Visit: Payer: Self-pay | Admitting: *Deleted

## 2013-05-12 MED ORDER — CLOPIDOGREL BISULFATE 75 MG PO TABS
75.0000 mg | ORAL_TABLET | Freq: Every day | ORAL | Status: DC
Start: ? — End: 2014-08-03

## 2013-05-20 ENCOUNTER — Other Ambulatory Visit: Payer: Self-pay | Admitting: Internal Medicine

## 2013-07-10 ENCOUNTER — Other Ambulatory Visit: Payer: Self-pay | Admitting: Internal Medicine

## 2013-07-13 ENCOUNTER — Ambulatory Visit (INDEPENDENT_AMBULATORY_CARE_PROVIDER_SITE_OTHER): Payer: Medicare Other | Admitting: Internal Medicine

## 2013-07-13 ENCOUNTER — Encounter: Payer: Self-pay | Admitting: Internal Medicine

## 2013-07-13 VITALS — BP 199/108 | HR 76 | Temp 97.7°F | Ht 63.0 in | Wt 134.2 lb

## 2013-07-13 DIAGNOSIS — I10 Essential (primary) hypertension: Secondary | ICD-10-CM

## 2013-07-13 DIAGNOSIS — Z23 Encounter for immunization: Secondary | ICD-10-CM

## 2013-07-13 DIAGNOSIS — Z0000 Encounter for general adult medical examination without abnormal findings: Secondary | ICD-10-CM

## 2013-07-13 DIAGNOSIS — E119 Type 2 diabetes mellitus without complications: Secondary | ICD-10-CM

## 2013-07-13 DIAGNOSIS — I701 Atherosclerosis of renal artery: Secondary | ICD-10-CM

## 2013-07-13 LAB — GLUCOSE, CAPILLARY: GLUCOSE-CAPILLARY: 346 mg/dL — AB (ref 70–99)

## 2013-07-13 LAB — POCT GLYCOSYLATED HEMOGLOBIN (HGB A1C): Hemoglobin A1C: 8.8

## 2013-07-13 MED ORDER — ENALAPRIL MALEATE 10 MG PO TABS: 30.0000 mg | ORAL_TABLET | Freq: Every day | ORAL | Status: DC

## 2013-07-13 MED ORDER — INSULIN GLARGINE 100 UNIT/ML ~~LOC~~ SOLN: 20.0000 [IU] | Freq: Every day | SUBCUTANEOUS | Status: DC

## 2013-07-13 NOTE — Progress Notes (Signed)
HPI The patient is a 80 y.o. female with a history of DM2, HTN, CAD, CHF, PVD, CKD, prior CVA, presenting for a follow-up visit for diabetes.  The patient has a history of DM.  A1C has increased today from 7.2 to 8.8.  Recently, for ease of administration, the patient's Humalog SSI was discontinued, and Lantus dose was increased for monotherapy, 15 units daily.  The patient's family members administer insulin for her, as the patient is unable to do this herself.  The patient's daughter notes that when her son manages her insulin, he still uses Humalog from time to time when he feels the blood sugar is "too high" (daughter doesn't know what too high is, how much insulin is administered, or how often this occurs).  The patient did not bring her glucometer, but logbook reveals blood sugars from the 100's to the 300's.  She denies lightheadedness, diaphoresis, AMS, blurry vision, polyuria/polydipsia.  The patient's BP is significantly elevated today.  She notes compliance with home BP meds.  ROS: General: no fevers, chills, changes in weight, changes in appetite Skin: no rash HEENT: no blurry vision, hearing changes, sore throat Pulm: no dyspnea, coughing, wheezing CV: no chest pain, palpitations, shortness of breath Abd: no abdominal pain, nausea/vomiting, diarrhea/constipation GU: no dysuria, hematuria, polyuria Ext: no arthralgias, myalgias Neuro: no weakness, numbness, or tingling  Filed Vitals:   07/13/13 1559  BP: 199/108  Pulse: 76  Temp: 97.7 F (36.5 C)    PEX General: alert, cooperative, and in no apparent distress HEENT: pupils equal round and reactive to light, vision grossly intact, oropharynx clear and non-erythematous  Neck: supple Lungs: clear to ascultation bilaterally, normal work of respiration, no wheezes, rales, ronchi Heart: regular rate and rhythm, no murmurs, gallops, or rubs Abdomen: soft, non-tender, non-distended, normal bowel sounds Extremities: no cyanosis,  clubbing, or edema Neurologic: alert, cranial nerves II-XII intact, strength grossly intact, sensation intact to light touch  Current Outpatient Prescriptions on File Prior to Visit  Medication Sig Dispense Refill  . ACCU-CHEK AVIVA PLUS test strip TEST BLOOD SUGAR THREE TIMES DAILY  100 each  6  . amLODipine (NORVASC) 10 MG tablet TAKE ONE (1) TABLET EACH DAY  30 tablet  5  . Blood Glucose Monitoring Suppl (ACCU-CHEK COMPACT CARE KIT) KIT 1 Device by Does not apply route 3 (three) times daily.  1 each  0  . calcitRIOL (ROCALTROL) 0.25 MCG capsule Take 1 capsule (0.25 mcg total) by mouth daily.  90 capsule  1  . clopidogrel (PLAVIX) 75 MG tablet Take 1 tablet (75 mg total) by mouth daily.  90 tablet  4  . enalapril (VASOTEC) 10 MG tablet Take 2 tablets (20 mg total) by mouth daily.  60 tablet  6  . feeding supplement (GLUCERNA SHAKE) LIQD Take 237 mLs by mouth 3 (three) times daily between meals.  24 Can  6  . furosemide (LASIX) 40 MG tablet Take 1.5 tablets (60 mg total) by mouth daily.  30 tablet  5  . insulin glargine (LANTUS) 100 UNIT/ML injection Inject 0.15 mLs (15 Units total) into the skin at bedtime.  15 mL  6  . Insulin Pen Needle (B-D ULTRAFINE III SHORT PEN) 31G X 8 MM MISC USE AS DIRECTED WITH INSULIN PENS  100 each  2  . metoprolol succinate (TOPROL-XL) 50 MG 24 hr tablet TAKE 1 TABLET BY MOUTH EVERY DAY WITH OR IMMEDIATELY FOLLOWING A MEAL  30 tablet  1  . Multiple Vitamin (MULITIVITAMIN WITH MINERALS)  TABS Take 1 tablet by mouth daily.      . pravastatin (PRAVACHOL) 40 MG tablet TAKE ONE (1) TABLET EACH                EVENING  30 tablet  6  . Rivastigmine 13.3 MG/24HR PT24 Place 1 patch (13.3 mg total) onto the skin once.  30 patch  6   No current facility-administered medications on file prior to visit.    Assessment/Plan

## 2013-07-13 NOTE — Assessment & Plan Note (Addendum)
BP Readings from Last 3 Encounters:  07/13/13 199/108  04/05/13 161/87  04/01/13 207/100    Lab Results  Component Value Date   NA 136 02/22/2013   K 3.8 02/22/2013   CREATININE 1.95* 02/22/2013    Assessment: Blood pressure control: severely elevated Progress toward BP goal:  deteriorated Comments: BP remains severely elevated, as on several prior visits.  She has a history of renal artery stenosis (L), stented in 2005.  As far as I can tell she has not had re-testing, though re-stenosis of stent could account for resistant HTN.  Also consider the fact that Rivastigmine can increase BP.  Plan: Medications:  Increase Enalapril to 30 mg daily (from 20).  Continue Amlodipine 10 mg daily.  Continue Toprol-XL 50 mg daily Educational resources provided:   Self management tools provided:   Other plans: Check NM renal perfusion

## 2013-07-13 NOTE — Assessment & Plan Note (Signed)
Lab Results  Component Value Date   HGBA1C 8.8 07/13/2013   HGBA1C 7.2 02/22/2013   HGBA1C 6.9 10/12/2012     Assessment: Diabetes control: fair control Progress toward A1C goal:  deteriorated Comments: A1C has increased since stopping Humalog.  Given limitations on frequency of insulin administration, we will continue to try Lantus monotherapy.  Plan: Medications:  Increase Lantus to 20 units daily (from 15) Home glucose monitoring: Frequency: 2 times a day Timing: before meals Instruction/counseling given: reminded to bring blood glucose meter & log to each visit Educational resources provided:   Self management tools provided:   Other plans: Recheck in 3-4 weeks

## 2013-07-13 NOTE — Patient Instructions (Signed)
General Instructions: For your Diabetes: -we are increasing your Lantus to 20 units once per day -bring your glucometer to your next visit  For your High Blood Pressure -we are increasing your Enalapril to 30 mg, once per day -we are also checking the blood vessels of your kidneys for blockage  We have given you the Pneumovax (Pneumonia vaccine) today.  It is normal to have some soreness of your arm for the next few days.  Please return for a follow-up visit in 3-4 weeks, to help adjust your insulin dose.   Treatment Goals:  Goals (1 Years of Data) as of 07/13/13   None      Progress Toward Treatment Goals:  Treatment Goal 07/13/2013  Hemoglobin A1C deteriorated  Blood pressure deteriorated    Self Care Goals & Plans:  Self Care Goal 04/05/2013  Manage my medications take my medicines as prescribed; bring my medications to every visit; refill my medications on time; follow the sick day instructions if I am sick  Monitor my health -  Eat healthy foods eat more vegetables; eat fruit for snacks and desserts; eat foods that are low in salt; eat smaller portions; drink diet soda or water instead of juice or soda  Be physically active find an activity I enjoy    Home Blood Glucose Monitoring 07/13/2013  Check my blood sugar 2 times a day  When to check my blood sugar before meals     Care Management & Community Referrals:  Referral 07/13/2013  Referrals made for care management support none needed  Referrals made to community resources -

## 2013-07-13 NOTE — Assessment & Plan Note (Signed)
-  pneumovax given today

## 2013-07-15 ENCOUNTER — Other Ambulatory Visit: Payer: Self-pay | Admitting: Internal Medicine

## 2013-07-15 NOTE — Progress Notes (Signed)
Case discussed with Dr. Brown at the time of the visit.  We reviewed the resident's history and exam and pertinent patient test results.  I agree with the assessment, diagnosis, and plan of care documented in the resident's note. 

## 2013-07-26 ENCOUNTER — Ambulatory Visit (HOSPITAL_COMMUNITY): Payer: Medicare Other

## 2013-08-03 ENCOUNTER — Ambulatory Visit (HOSPITAL_COMMUNITY): Payer: Medicare Other

## 2013-08-07 ENCOUNTER — Encounter: Payer: Self-pay | Admitting: Gastroenterology

## 2013-08-10 ENCOUNTER — Encounter (HOSPITAL_COMMUNITY): Payer: Medicare Other

## 2013-08-11 ENCOUNTER — Ambulatory Visit (HOSPITAL_COMMUNITY)
Admission: RE | Admit: 2013-08-11 | Discharge: 2013-08-11 | Disposition: A | Discharge status: Home / Self Care | Payer: Medicare Other | Source: Ambulatory Visit | Attending: Internal Medicine | Admitting: Internal Medicine

## 2013-08-11 DIAGNOSIS — I701 Atherosclerosis of renal artery: Secondary | ICD-10-CM

## 2013-08-12 ENCOUNTER — Ambulatory Visit: Payer: Medicare Other | Admitting: Internal Medicine

## 2013-08-13 ENCOUNTER — Other Ambulatory Visit (HOSPITAL_COMMUNITY): Payer: Self-pay | Admitting: Radiology

## 2013-08-13 ENCOUNTER — Other Ambulatory Visit (HOSPITAL_COMMUNITY): Payer: Self-pay | Admitting: Respiratory Therapy

## 2013-08-24 ENCOUNTER — Encounter: Payer: Self-pay | Admitting: Internal Medicine

## 2013-08-24 ENCOUNTER — Ambulatory Visit (INDEPENDENT_AMBULATORY_CARE_PROVIDER_SITE_OTHER): Payer: Medicare Other | Admitting: Internal Medicine

## 2013-08-24 VITALS — BP 215/109 | HR 73 | Temp 97.3°F | Ht 63.0 in | Wt 134.5 lb

## 2013-08-24 DIAGNOSIS — I10 Essential (primary) hypertension: Secondary | ICD-10-CM

## 2013-08-24 DIAGNOSIS — E119 Type 2 diabetes mellitus without complications: Secondary | ICD-10-CM

## 2013-08-24 DIAGNOSIS — I129 Hypertensive chronic kidney disease with stage 1 through stage 4 chronic kidney disease, or unspecified chronic kidney disease: Secondary | ICD-10-CM

## 2013-08-24 MED ORDER — INSULIN NPH ISOPHANE & REGULAR (70-30) 100 UNIT/ML ~~LOC~~ SUSP: 10.0000 [IU] | Freq: Two times a day (BID) | SUBCUTANEOUS | Status: DC

## 2013-08-24 MED ORDER — CLONIDINE HCL 0.1 MG/24HR TD PTWK: 0.1000 mg | MEDICATED_PATCH | TRANSDERMAL | Status: DC

## 2013-08-24 NOTE — Progress Notes (Signed)
Subjective:    Patient ID: [name], female    DOB: 1931/08/10, 80 y.o.   MRN: [id number]  HPI [name] is a 80 yo woman pmh as listed below presents for HTN recheck.   The patient is accompanied by her son who is not her primary caregiver that is another son who is not present for the visit. His son does not seem acquainted with the patient's care and is unable to provide helpful information during the interview. The patient is very demented and therefore most of the information was taken by chart review.  Pt was recently seen and scheduled for a NM Renal artery evaluation given concern for restenosis of previous stent and resistant HTN. (this was noted by calling the nuclear medicine study with the knowledge that the patient's family has canceled appointment 5 times and we'll not reschedule) Pt had some increased enalapril and continued amlodipine and toprol-XL which The son reports compliance: But this cannot be validated as he is the primary caregiver and there were no recordings of this made).   In terms of her DM she had increase of her lantus from 15 units to 20 units and pt does not bring in her meter for review. She has not had any hypoglycemic episodes. denies polyuria, polydipsia, nausea, vomiting, diarrhea.  does not request refills today. The patient is accompanied by a logbook that the family uses that does appear to have her meals written down and the 20 units of Lantus administered every day. She is having concerning readings of low 40s in the morning as high as 4 to 500s by mealtime. Therefore she is having fluctuating CBGs. Her lowest was 42, and highest 600.    Past Medical History  Diagnosis Date  . Hyperlipidemia   . Hypertension   . Diabetes mellitus   . Chronic kidney disease     stage IV  . RAS (renal artery stenosis)   . Stroke     s/p CVA unknown timing  . Ischemic cardiomyopathy     EF 20%  . Dementia   . CAD (coronary artery disease)     s/p PTCA May  2005, stenting of an intermittent blanch of the CMX and the angioplasty of teh diagonal branch to the left descending   Current Outpatient Prescriptions on File Prior to Visit  Medication Sig Dispense Refill  . ACCU-CHEK AVIVA PLUS test strip TEST BLOOD SUGAR THREE TIMES DAILY  100 each  6  . amLODipine (NORVASC) 10 MG tablet TAKE ONE (1) TABLET EACH DAY  30 tablet  5  . Blood Glucose Monitoring Suppl (ACCU-CHEK COMPACT CARE KIT) KIT 1 Device by Does not apply route 3 (three) times daily.  1 each  0  . calcitRIOL (ROCALTROL) 0.25 MCG capsule Take 1 capsule (0.25 mcg total) by mouth daily.  90 capsule  1  . clopidogrel (PLAVIX) 75 MG tablet Take 1 tablet (75 mg total) by mouth daily.  90 tablet  4  . enalapril (VASOTEC) 10 MG tablet Take 3 tablets (30 mg total) by mouth daily.  60 tablet  6  . feeding supplement (GLUCERNA SHAKE) LIQD Take 237 mLs by mouth 3 (three) times daily between meals.  24 Can  6  . furosemide (LASIX) 40 MG tablet Take 1.5 tablets (60 mg total) by mouth daily.  30 tablet  5  . insulin glargine (LANTUS) 100 UNIT/ML injection Inject 0.2 mLs (20 Units total) into the skin at bedtime.  15 mL  6  .  Insulin Pen Needle (B-D ULTRAFINE III SHORT PEN) 31G X 8 MM MISC USE AS DIRECTED WITH INSULIN PENS  100 each  2  . metoprolol succinate (TOPROL-XL) 50 MG 24 hr tablet TAKE 1 TABLET BY MOUTH EVERY DAY WITH OR IMMEDIATELY FOLLOWING A MEAL  30 tablet  1  . metoprolol succinate (TOPROL-XL) 50 MG 24 hr tablet TAKE 1 TABLET BY MOUTH EVERY DAY WITH OR IMMEDIATELY FOLLOWING A MEAL  30 tablet  1  . Multiple Vitamin (MULITIVITAMIN WITH MINERALS) TABS Take 1 tablet by mouth daily.      . pravastatin (PRAVACHOL) 40 MG tablet TAKE ONE (1) TABLET EACH                EVENING  30 tablet  6  . Rivastigmine 13.3 MG/24HR PT24 Place 1 patch (13.3 mg total) onto the skin once.  30 patch  6   No current facility-administered medications on file prior to visit.   Social, surgical, family history reviewed  with patient and updated in appropriate chart locations.   Review of Systems Negative except Listed in the history of present illness, very limited given patient's dementia     Objective:   Physical Exam Filed Vitals:   08/24/13 1554  BP: 215/109  Pulse: 73  Temp: 97.3 F (36.3 C)   General: sitting in wheelchair HEENT: PERRL, EOMI, no scleral icterus Cardiac: RRR, no rubs, murmurs or gallops Pulm: clear to auscultation bilaterally, moving normal volumes of air Abd: soft, nontender, nondistended, BS present Ext: warm and well perfused, no pedal edema Neuro: alert and oriented x self, cranial nerves II-XII grossly intact    Assessment & Plan:  Please see problem oriented charting  Pt discussed with Dr. Dareen Piano

## 2013-08-24 NOTE — Assessment & Plan Note (Signed)
Lab Results  Component Value Date   HGBA1C 8.8 07/13/2013   HGBA1C 7.2 02/22/2013   HGBA1C 6.9 10/12/2012     Assessment: Diabetes control:  Same Progress toward A1C goal:    same Comments: there is great concern that hemoglobin A1c is only representation of these large fluctuations as found on the patient's log book  Plan: Medications:  Will discontinue Lantus altogether as the family is resistant and unable to give the patient meal coverage insulin. Therefore the best compromise would be 70/30 at 10 units twice a day Home glucose monitoring: Frequency:   Timing:   Instruction/counseling given: reminded to get eye exam, reminded to bring blood glucose meter & log to each visit, reminded to bring medications to each visit and discussed diet Educational resources provided: brochure Self management tools provided: copy of home glucose meter download Other plans: to followup in one week given the concern that family members are not actually able to provide insulin and timely manner

## 2013-08-24 NOTE — Patient Instructions (Signed)
General Instructions:   Thank you for bringing your medicines today. This helps [place] keep you safe from mistakes.   Progress Toward Treatment Goals:  Treatment Goal 07/13/2013  Hemoglobin A1C deteriorated  Blood pressure deteriorated    Self Care Goals & Plans:  Self Care Goal 08/24/2013  Manage my medications take my medicines as prescribed; bring my medications to every visit  Monitor my health keep track of my blood glucose; bring my glucose meter and log to each visit  Eat healthy foods drink diet soda or water instead of juice or soda; eat more vegetables; eat foods that are low in salt; eat baked foods instead of fried foods; eat fruit for snacks and desserts  Be physically active -    Home Blood Glucose Monitoring 07/13/2013  Check my blood sugar 2 times a day  When to check my blood sugar before meals     Care Management & Community Referrals:  Referral 07/13/2013  Referrals made for care management support none needed  Referrals made to community resources -

## 2013-08-24 NOTE — Assessment & Plan Note (Addendum)
BP Readings from Last 3 Encounters:  08/24/13 215/109  07/13/13 199/108  04/05/13 161/87  upon recheck blood pressure was 195/110  Lab Results  Component Value Date   NA 136 02/22/2013   K 3.8 02/22/2013   CREATININE 1.95* 02/22/2013    Assessment: Blood pressure control:  very poor/malignant Progress toward BP goal:   deteriorated Comments: there is great concern that medication noncompliance as the patient is on many maximal agents and therefore there may be an issue as the patient has dementia and is not administering her own medications this may be a direct result of her caregivers administration  Plan: Medications:  continue current medications, Along with adding a clonidine patch 0.1 mg q. 7 days Educational resources provided: brochure Self management tools provided: home blood pressure logbook Other plans: patient to followup in one week for reevaluation, TAH and referral and social work consult were both placed for concern of that medication adherence and compliance given the patient's dementia and poor control of chronic diseases

## 2013-08-25 ENCOUNTER — Telehealth: Payer: Self-pay | Admitting: Licensed Clinical Social Worker

## 2013-08-25 ENCOUNTER — Telehealth: Payer: Self-pay | Admitting: *Deleted

## 2013-08-25 LAB — GLUCOSE, CAPILLARY: GLUCOSE-CAPILLARY: 414 mg/dL — AB (ref 70–99)

## 2013-08-25 NOTE — Telephone Encounter (Signed)
Call from [place], patient's daughter about scheduled Renal Studies.  Appointment was given to [place] for 09/04/2023 for an arrival time of 11;45 AM.  A calls been made to Interventional Radiology to confirm ana appointment for a micropuncture prior to the Renal Studies for IV access.  Call was made to [facility] and a message left to call [place] to confirm the 11:45 arrival time for the Micropuncture.  [name] to be called to reaffirm the time of arrival and appointment for the patient.   Stressed to [employer] the importance of bringing patient for the appointment.   Sander Nephew, RN 08/25/2013 10:49 AM

## 2013-08-25 NOTE — Progress Notes (Signed)
INTERNAL MEDICINE TEACHING ATTENDING ADDENDUM - [name] Contes, MD: I personally saw and evaluated [name] in this clinic visit in conjunction with the resident, Dr. Algis Cook. I have discussed patient's plan of care with medical resident during this visit. I have confirmed the physical exam findings and have read and agree with the clinic note including the plan with the following addition:  - Of note, patients systolic BP on repeat was 195.  - Patient will need close follow up with clinic. Education was provided to son at bedside regarding BP monitoring and compliance with medications - Patient to follow up next week at clinic for BP check and titration of meds

## 2013-08-25 NOTE — Addendum Note (Signed)
Addended by: Jerrye Noble on: 08/25/2013 09:18 AM   Modules accepted: Orders

## 2013-08-25 NOTE — Telephone Encounter (Signed)
CSW placed call to pt's daughter, Marden Noble who is listed on chart as primary contact.  CSW discussed with [name], concerns regarding pt's chronic disease management.  [name] states she had been overseeing pt's primary care, but had surgery and it is now time for other siblings to "step up".  CSW discussed the ability of sibling to provide and manage pt's chronic disease and offered [facility] care management.  Dr. Algis Liming placed order yesterday and pt has been assigned to Sherrin Daisy, RN [phone].  CSW requesting SW services as well.  Daughter [name] expecting contact call within next 10 days.  [name] unaware if Ms. Morgenthaler has applied for Medicaid. Pt's adult children: Ollie ([phone] and Pops (son) live with patient. Finances: Gwenlyn Fudge [phone] CSW placed call to daughter, Mateo Flow to inquire if family has applied for medicaid as pt would benefit from [facility].  In addition, daughter, [name] would like meals on wheels started.  There would be a cost incurred for MoW.  CSW left message for daughter, Mateo Flow.  [place] not in [place].

## 2013-08-30 NOTE — Telephone Encounter (Signed)
CSW left message requesting Financial POA to return call to discuss referral to MoW.

## 2013-08-31 ENCOUNTER — Encounter: Payer: Self-pay | Admitting: Internal Medicine

## 2013-08-31 ENCOUNTER — Ambulatory Visit (INDEPENDENT_AMBULATORY_CARE_PROVIDER_SITE_OTHER): Payer: Medicare Other | Admitting: Internal Medicine

## 2013-08-31 VITALS — BP 170/92 | HR 71 | Temp 97.8°F | Wt 135.4 lb

## 2013-08-31 DIAGNOSIS — E119 Type 2 diabetes mellitus without complications: Secondary | ICD-10-CM

## 2013-08-31 DIAGNOSIS — I10 Essential (primary) hypertension: Secondary | ICD-10-CM

## 2013-08-31 MED ORDER — SYRINGE (DISPOSABLE) 1 ML MISC: 1.0000 | Freq: Two times a day (BID) | Status: DC

## 2013-08-31 MED ORDER — CLONIDINE HCL 0.1 MG/24HR TD PTWK: 0.1000 mg | MEDICATED_PATCH | TRANSDERMAL | Status: DC

## 2013-08-31 NOTE — Telephone Encounter (Signed)
Pt has an appointment today at [facility].  CSW left information on [employer] and [employer] meals with nursing to provide to pt/family during visit.

## 2013-08-31 NOTE — Patient Instructions (Signed)
General Instructions:  For the diabetes:  1. Stop Lantus 2. Continue the novolin 70/30 at 10 units twice a day  3. Follow up in 1 wk   For the high blood pressure 1. Continue all the current medicine and change the patch once a week   Thank you for bringing your medicines today. This helps [place] keep you safe from mistakes.   Progress Toward Treatment Goals:  Treatment Goal 07/13/2013  Hemoglobin A1C deteriorated  Blood pressure deteriorated    Self Care Goals & Plans:  Self Care Goal 08/24/2013  Manage my medications take my medicines as prescribed; bring my medications to every visit  Monitor my health keep track of my blood glucose; bring my glucose meter and log to each visit  Eat healthy foods drink diet soda or water instead of juice or soda; eat more vegetables; eat foods that are low in salt; eat baked foods instead of fried foods; eat fruit for snacks and desserts  Be physically active -    Home Blood Glucose Monitoring 07/13/2013  Check my blood sugar 2 times a day  When to check my blood sugar before meals     Care Management & Community Referrals:  Referral 07/13/2013  Referrals made for care management support none needed  Referrals made to community resources -

## 2013-08-31 NOTE — Progress Notes (Signed)
Subjective:    Patient ID: [name], female    DOB: Jul 10, 1931, 80 y.o.   MRN: [id number]  HPI [name] is a 80 yo woman pmh as listed below presents for HTN and DM follow up.   HTN - Since the last visit the family has put the clonidine patch on the patient and she has not had any local site reaction. The patient has not complained to anyone about CP, SOB, DOE, and they have not noticed any LE edema.   DM - Patient son checking blood sugars 1-2 times daily, and they bring in the log book where it appears the pt is only eating one meal a day. The patient son was giving both lantus and novolog 70/30 for the last week. It is hard to know in the setting of patient's dementia if she is having any hypoglycemic symptoms but it appears she has not had any episodes since last visit. Family does not report any polyuria, polydipsia, nausea, vomiting, diarrhea.  does not request refills today.  Past Medical History  Diagnosis Date  . Hyperlipidemia   . Hypertension   . Diabetes mellitus   . Chronic kidney disease     stage IV  . RAS (renal artery stenosis)   . Stroke     s/p CVA unknown timing  . Ischemic cardiomyopathy     EF 20%  . Dementia   . CAD (coronary artery disease)     s/p PTCA May 2005, stenting of an intermittent blanch of the CMX and the angioplasty of teh diagonal branch to the left descending   Current Outpatient Prescriptions on File Prior to Visit  Medication Sig Dispense Refill  . ACCU-CHEK AVIVA PLUS test strip TEST BLOOD SUGAR THREE TIMES DAILY  100 each  6  . amLODipine (NORVASC) 10 MG tablet TAKE ONE (1) TABLET EACH DAY  30 tablet  5  . Blood Glucose Monitoring Suppl (ACCU-CHEK COMPACT CARE KIT) KIT 1 Device by Does not apply route 3 (three) times daily.  1 each  0  . calcitRIOL (ROCALTROL) 0.25 MCG capsule Take 1 capsule (0.25 mcg total) by mouth daily.  90 capsule  1  . cloNIDine (CATAPRES - DOSED IN MG/24 HR) 0.1 mg/24hr patch Place 1 patch (0.1 mg total)  onto the skin once a week.  4 patch  11  . clopidogrel (PLAVIX) 75 MG tablet Take 1 tablet (75 mg total) by mouth daily.  90 tablet  4  . enalapril (VASOTEC) 10 MG tablet Take 3 tablets (30 mg total) by mouth daily.  60 tablet  6  . feeding supplement (GLUCERNA SHAKE) LIQD Take 237 mLs by mouth 3 (three) times daily between meals.  24 Can  6  . furosemide (LASIX) 40 MG tablet Take 1.5 tablets (60 mg total) by mouth daily.  30 tablet  5  . insulin NPH-regular Human (NOVOLIN 70/30) (70-30) 100 UNIT/ML injection Inject 10 Units into the skin 2 (two) times daily with a meal.  10 mL  3  . Insulin Pen Needle (B-D ULTRAFINE III SHORT PEN) 31G X 8 MM MISC USE AS DIRECTED WITH INSULIN PENS  100 each  2  . metoprolol succinate (TOPROL-XL) 50 MG 24 hr tablet TAKE 1 TABLET BY MOUTH EVERY DAY WITH OR IMMEDIATELY FOLLOWING A MEAL  30 tablet  1  . Multiple Vitamin (MULITIVITAMIN WITH MINERALS) TABS Take 1 tablet by mouth daily.      . pravastatin (PRAVACHOL) 40 MG tablet TAKE ONE (  1) TABLET EACH                EVENING  30 tablet  6  . Rivastigmine 13.3 MG/24HR PT24 Place 1 patch (13.3 mg total) onto the skin once.  30 patch  6   No current facility-administered medications on file prior to visit.   Social, surgical, family history reviewed with patient and updated in appropriate chart locations.   Review of Systems  Negative except as listed in HPI History obtained from son and a daughter, chart review and unobtainable from patient due to dementia     Objective:   Physical Exam Filed Vitals:   08/31/13 1551  BP: 170/92  Pulse: 71  Temp: 97.8 F (36.6 C)   General: sitting in chair, NAD HEENT: no scleral icterus Cardiac: RRR, no rubs, murmurs or gallops Pulm: clear to auscultation bilaterally, moving normal volumes of air Ext: warm and well perfused, no pedal edema Neuro: alert and oriented X3, cranial nerves II-XII grossly intact    Assessment & Plan:  Please see problem oriented  charting  Pt discussed with Dr. Dareen Piano

## 2013-09-01 ENCOUNTER — Telehealth: Payer: Self-pay | Admitting: Licensed Clinical Social Worker

## 2013-09-01 NOTE — Telephone Encounter (Signed)
[name] was referred to CSW for home health RN and MSW.  CSW placed call to daughter [name].  Pt/Family has no preference of agency and used [facility] in the past.  Daughter [name] states she is familiar with [facility].  CSW sent referral to [facility] liaison.

## 2013-09-01 NOTE — Progress Notes (Signed)
INTERNAL MEDICINE TEACHING ATTENDING ADDENDUM - Nischal Narendra, MD: I reviewed and discussed at the time of visit with the resident Dr. Sadek, the patient's medical history, physical examination, diagnosis and results of tests and treatment and I agree with the patient's care as documented. 

## 2013-09-01 NOTE — Assessment & Plan Note (Signed)
BP Readings from Last 3 Encounters:  08/31/13 170/92  08/24/13 215/109  07/13/13 199/108    Lab Results  Component Value Date   NA 136 02/22/2013   K 3.8 02/22/2013   CREATININE 1.95* 02/22/2013    Assessment: Blood pressure control:  much better  Progress toward BP goal:    Comments: pt has dramatically improved   Plan: Medications:  continue current medications Educational resources provided:   Self management tools provided:   Other plans: it is unknown whom is administering and what is being administered to the patient. There is GRAVE concern that the patient may suffer from rebound HTN if the family doesn't change patch appropriately. Therefore [facility] and CSW were ordered and instruction to assess patient safety as many family members don't understand patient care and pt is demented and can't take care of herself

## 2013-09-01 NOTE — Assessment & Plan Note (Signed)
Lab Results  Component Value Date   HGBA1C 8.8 07/13/2013   HGBA1C 7.2 02/22/2013   HGBA1C 6.9 10/12/2012     Assessment: Diabetes control:  same  Progress toward A1C goal:    Comments: again this is probably not a true representation of pt DM status given large swings in CBG  Plan: Medications:  STOP LANTUS this was stressed last visit and written again on several areas and repeated with family members education that combination with novolog 70/30 would be dangerous and detrimental to the patient including DEATH Home glucose monitoring: encouraged at least TID or BID minimal before breakfast and before dinner Frequency:   Timing:   Instruction/counseling given: reminded to bring blood glucose meter & log to each visit, reminded to bring medications to each visit and discussed diet Educational resources provided:   Self management tools provided:   Other plans: follow up in 1 wk

## 2013-09-03 ENCOUNTER — Other Ambulatory Visit: Payer: Self-pay | Admitting: Internal Medicine

## 2013-09-03 ENCOUNTER — Encounter (HOSPITAL_COMMUNITY)
Admission: RE | Admit: 2013-09-03 | Discharge: 2013-09-03 | Disposition: A | Discharge status: Home / Self Care | Payer: Medicare Other | Source: Ambulatory Visit | Attending: Internal Medicine | Admitting: Internal Medicine

## 2013-09-03 DIAGNOSIS — I701 Atherosclerosis of renal artery: Secondary | ICD-10-CM

## 2013-09-03 MED ORDER — TECHNETIUM TC 99M MERTIATIDE
15.0000 | Freq: Once | INTRAVENOUS | Status: AC | PRN
  Administered 2013-09-03: 15 via INTRAVENOUS

## 2013-09-06 ENCOUNTER — Telehealth: Payer: Self-pay | Admitting: *Deleted

## 2013-09-06 NOTE — Telephone Encounter (Signed)
Katharine Look with [facility] called about A1C - last one done 07/13/13 and sch again 3 months. Katharine Look states there are new directions for home care in past month. Also express concerns that no one went over directions about using  Catapres patch on applying to skin. Talked with lab and S. Powers about A1C. Ivin Booty called and talked with Pearletha Forge with [place]. Pearletha Forge will call Katharine Look with [facility] about A1C. Hilda Blades Ditzler RN 09/06/13 10:45AM

## 2013-09-10 ENCOUNTER — Encounter: Payer: Self-pay | Admitting: Internal Medicine

## 2013-09-12 NOTE — Assessment & Plan Note (Addendum)
Assessment:  Restenosis of the left renal artery, consistent with her recent refractory hypertension.   Plan:   - Has been following with nephrology. Ensure further follow up with nephrology.

## 2013-09-13 ENCOUNTER — Ambulatory Visit (INDEPENDENT_AMBULATORY_CARE_PROVIDER_SITE_OTHER): Payer: Medicare Other | Admitting: Internal Medicine

## 2013-09-13 ENCOUNTER — Encounter: Payer: Self-pay | Admitting: Internal Medicine

## 2013-09-13 VITALS — BP 164/91 | HR 80 | Temp 97.9°F | Ht 63.0 in | Wt 136.2 lb

## 2013-09-13 DIAGNOSIS — E119 Type 2 diabetes mellitus without complications: Secondary | ICD-10-CM

## 2013-09-13 DIAGNOSIS — I129 Hypertensive chronic kidney disease with stage 1 through stage 4 chronic kidney disease, or unspecified chronic kidney disease: Secondary | ICD-10-CM

## 2013-09-13 DIAGNOSIS — I10 Essential (primary) hypertension: Secondary | ICD-10-CM

## 2013-09-13 DIAGNOSIS — E785 Hyperlipidemia, unspecified: Secondary | ICD-10-CM

## 2013-09-13 DIAGNOSIS — N184 Chronic kidney disease, stage 4 (severe): Secondary | ICD-10-CM

## 2013-09-13 DIAGNOSIS — I701 Atherosclerosis of renal artery: Secondary | ICD-10-CM

## 2013-09-13 LAB — BASIC METABOLIC PANEL WITH GFR
BUN: 21 mg/dL (ref 6–23)
CALCIUM: 8.6 mg/dL (ref 8.4–10.5)
CO2: 24 mEq/L (ref 19–32)
Chloride: 99 mEq/L (ref 96–112)
Creat: 1.95 mg/dL — ABNORMAL HIGH (ref 0.50–1.10)
GFR, EST NON AFRICAN AMERICAN: 24 mL/min — AB
GFR, Est African American: 28 mL/min — ABNORMAL LOW
GLUCOSE: 181 mg/dL — AB (ref 70–99)
POTASSIUM: 4 meq/L (ref 3.5–5.3)
SODIUM: 137 meq/L (ref 135–145)

## 2013-09-13 LAB — CBC
HCT: 29 % — ABNORMAL LOW (ref 36.0–46.0)
HEMOGLOBIN: 9.5 g/dL — AB (ref 12.0–15.0)
MCH: 31.4 pg (ref 26.0–34.0)
MCHC: 32.8 g/dL (ref 30.0–36.0)
MCV: 95.7 fL (ref 78.0–100.0)
PLATELETS: 319 10*3/uL (ref 150–400)
RBC: 3.03 MIL/uL — AB (ref 3.87–5.11)
RDW: 11.9 % (ref 11.5–15.5)
WBC: 12.1 10*3/uL — ABNORMAL HIGH (ref 4.0–10.5)

## 2013-09-13 LAB — GLUCOSE, CAPILLARY: GLUCOSE-CAPILLARY: 217 mg/dL — AB (ref 70–99)

## 2013-09-13 MED ORDER — INSULIN NPH ISOPHANE & REGULAR (70-30) 100 UNIT/ML ~~LOC~~ SUSP: SUBCUTANEOUS | Status: DC

## 2013-09-13 MED ORDER — AMLODIPINE BESYLATE 10 MG PO TABS: ORAL_TABLET | ORAL | Status: DC

## 2013-09-13 MED ORDER — METOPROLOL SUCCINATE ER 50 MG PO TB24: ORAL_TABLET | ORAL | Status: DC

## 2013-09-13 MED ORDER — FUROSEMIDE 40 MG PO TABS: 60.0000 mg | ORAL_TABLET | Freq: Every day | ORAL | Status: DC

## 2013-09-13 NOTE — Assessment & Plan Note (Addendum)
Patient is prescribed for 40 mg pravastatin but level of adherence is unclear. Follow up lipid panel taken today.   09/28/13 Addendum: - Lab company was not able to obtain ordered lipid panel. Plan to follow up at next appointment.

## 2013-09-13 NOTE — Assessment & Plan Note (Signed)
Lab Results  Component Value Date   HGBA1C 8.8 07/13/2013   HGBA1C 7.2 02/22/2013   HGBA1C 6.9 10/12/2012     Assessment: Diabetes control: fair control Progress toward A1C goal:  deteriorated Comments: Ms. Bier is exhibiting hyperglycemia during the afternoons and hypoglycemia at night.   Plan: Medications:  Novolin 70/30 Home glucose monitoring: Frequency: 2 times a day Timing: before breakfast;before dinner Instruction/counseling given: other instruction/counseling: instructed to change novolin regimen to 20 units with breakfast and 5 units with supper

## 2013-09-13 NOTE — Assessment & Plan Note (Addendum)
BP Readings from Last 3 Encounters:  09/13/13 164/91  08/31/13 170/92  08/24/13 215/109    Lab Results  Component Value Date   NA 136 02/22/2013   K 3.8 02/22/2013   CREATININE 1.95* 02/22/2013    Assessment: Blood pressure control: mildly elevated Progress toward BP goal:  improved Comments: Though her recent NM scan showed that she has restenosis of her left renal artery, which is likely the etiology of her hypertension, she has exhibited improved BP control with the clonidine patch, in addition to her amlodipine and enalapril. However, the level of medication adherence at home is unclear, so unclear if she has been taking her metoprolol and furosemide. Further, renal function may be declining in the context of an increasing creatinine.   Plan: - Medications:  continue current medications - BMP - Referral for social work to arrange for [facility].   - Referral to nephrology

## 2013-09-13 NOTE — Progress Notes (Addendum)
   Subjective:    Patient ID: [name], female    DOB: 1931/06/15, 80 y.o.   MRN: [id number]  HPI  [name] is presenting today for follow-up for her NM renal scan from 09/10/13. The scan showed that there was diminished blood flow to the left kidney, which is consistent with her known history of renal artery stenosis. She has received stents in 2004 and 2012. Currently, she is medical management for her hypertension, though it is not clear what her degree of medical adherence is at this point.  Regarding her diabetes, her son [name] reports that she has stopped the lantus as recommended at the last appointment and is now only on the 70/30 novolin. Her serum glucose levels have shown poor control with hyperglycemia in the afternoons (average 491) and hypoglycemia at night (with low at 49). She and her son deny any episodes of lightheadedness or diaphoresis.   Her son reports that she has not other acute complaints. Overall, her two sons and daughter have been involved in her care. She has some limits with ADLs in that she is able to dress and eat, but not bathe and cook by herself. A home care referral had been sent as of the last visit, but her son denies that anyone has yet come out to the home.    Review of Systems  Constitutional: Negative for fever, chills and appetite change.  HENT: Negative for rhinorrhea and sore throat.   Respiratory: Negative for cough and shortness of breath.   Cardiovascular: Negative for chest pain, palpitations and leg swelling.  Gastrointestinal: Negative for vomiting, abdominal pain and diarrhea.  Genitourinary: Negative for dysuria and hematuria.  Musculoskeletal: Negative for arthralgias.  Neurological: Negative for headaches.      Objective:   Physical Exam  Constitutional: She appears well-developed.  HENT:  Head: Normocephalic.  Eyes: Pupils are equal, round, and reactive to light.  Neck: Normal range of motion. Neck supple. No thyromegaly  present.  Cardiovascular: Normal rate, regular rhythm and normal heart sounds.  Exam reveals no gallop and no friction rub.   No murmur heard. Pulmonary/Chest: Breath sounds normal. She has no wheezes. She has no rales.  Abdominal: Soft. Bowel sounds are normal. She exhibits no distension. There is no tenderness.  Neurological: She is alert.  Somewhat incoherent and tangential speech  Skin: No rash noted. No erythema. No pallor.      Assessment & Plan:   Please see problem based charting for A/P.    Addendum 10/18/13:  Patient only received a stent in 2004, with an aborted procedure in 2005. No procedure performed in 2012.

## 2013-09-13 NOTE — Patient Instructions (Addendum)
Please ensure that you are taking all of the medications indicated. Specifically, we have sent prescriptions for metoprolol and furosemide (lasix), which were not in the bag that was brought to the appointment.   We have changed your insulin regimen. Before, you were taking 10 units in the morning and 10 in the afternoon. Now, you will take 20 units in the morning with breakfast and 5 units in the evening with supper.   Come back and see me in one month.

## 2013-09-14 NOTE — Progress Notes (Signed)
I saw and evaluated the patient.  I personally confirmed the key portions of the history and exam documented by Dr. Raelene Bott and I reviewed pertinent patient test results.  The assessment, diagnosis, and plan were formulated together and I agree with the documentation in the resident's note.

## 2013-09-30 ENCOUNTER — Other Ambulatory Visit: Payer: Self-pay | Admitting: Internal Medicine

## 2013-10-07 ENCOUNTER — Telehealth: Payer: Self-pay | Admitting: Licensed Clinical Social Worker

## 2013-10-07 NOTE — Telephone Encounter (Signed)
CSW received call from [name] regarding home care needs for her mother, Ms. Lanum.  Daughter requesting Home Health nurse or social worker.  CSW informed [name] order was placed for services in late June and [facility] indicated start of care for 09/06/13.  CSW referred [name] to [facility] for additional information regarding services provided from [facility].  Daughter inquiring if there were any other services available to Ms.Vanliew.  CSW informed [name] pt insurance does not cover ongoing personal care.  Home Health is time specific.  Daughter voiced understanding.  CSW encourged daughter to contact DSS for possible application to Medicaid, as Medicaid covers more custodial care services.  CSW provided daughter with the number to [facility] and DSS.

## 2013-10-29 ENCOUNTER — Institutional Professional Consult (permissible substitution): Payer: Medicare Other | Admitting: Internal Medicine

## 2013-11-05 ENCOUNTER — Encounter: Payer: Self-pay | Admitting: Internal Medicine

## 2013-11-05 ENCOUNTER — Ambulatory Visit (INDEPENDENT_AMBULATORY_CARE_PROVIDER_SITE_OTHER): Payer: Medicare Other | Admitting: Internal Medicine

## 2013-11-05 VITALS — BP 140/90 | HR 69 | Wt 130.0 lb

## 2013-11-05 DIAGNOSIS — I509 Heart failure, unspecified: Secondary | ICD-10-CM

## 2013-11-05 DIAGNOSIS — I150 Renovascular hypertension: Secondary | ICD-10-CM

## 2013-11-05 DIAGNOSIS — N184 Chronic kidney disease, stage 4 (severe): Secondary | ICD-10-CM

## 2013-11-05 DIAGNOSIS — I5022 Chronic systolic (congestive) heart failure: Secondary | ICD-10-CM

## 2013-11-05 NOTE — Patient Instructions (Signed)
Your physician recommends that you continue on your current medications as directed. Please refer to the Current Medication list given to you today. Your physician wants you to follow-up in: Jan 2016 with Dr. Harrington Challenger.  You will receive a reminder letter in the mail two months in advance. If you don't receive a letter, please call our office to schedule the follow-up appointment.

## 2013-11-05 NOTE — Progress Notes (Addendum)
HPI Patient is a 80 yo who is referred from medicine clinic for evaluation of HTN. Patient has a history of CAD (by report, s/p PCI of OM and DIag in past), ICM, HTN, HL, DM, CKD stage IV She has known RAS and has undergone interventions in past.  IN medicine clinic her BP has been severely elevated.   She underwent nuclear study for renal blood flow  It showed signif decreased perfusion of L kidney  She denies SOB  No CP  One son is with her  Brings log of glucose readings  No BP checks.                                                        Current Outpatient Prescriptions on File     No Known Allergies  Current Outpatient Prescriptions  Medication Sig Dispense Refill  . ACCU-CHEK AVIVA PLUS test strip TEST BLOOD SUGAR THREE TIMES DAILY  100 each  6  . amLODipine (NORVASC) 10 MG tablet TAKE ONE (1) TABLET EACH DAY  30 tablet  5  . BD INSULIN SYRINGE ULTRAFINE 31G X 5/16" 0.3 ML MISC 1 SYRINGE BY DOES NOT APPLY ROUTE 2 (TWO) TIMES DAILY.  60 each  3  . Blood Glucose Monitoring Suppl (ACCU-CHEK COMPACT CARE KIT) KIT 1 Device by Does not apply route 3 (three) times daily.  1 each  0  . calcitRIOL (ROCALTROL) 0.25 MCG capsule Take 1 capsule (0.25 mcg total) by mouth daily.  90 capsule  1  . cloNIDine (CATAPRES - DOSED IN MG/24 HR) 0.1 mg/24hr patch Place 1 patch (0.1 mg total) onto the skin once a week.  4 patch  11  . clopidogrel (PLAVIX) 75 MG tablet Take 1 tablet (75 mg total) by mouth daily.  90 tablet  4  . enalapril (VASOTEC) 10 MG tablet Take 3 tablets (30 mg total) by mouth daily.  60 tablet  6  . feeding supplement (GLUCERNA SHAKE) LIQD Take 237 mLs by mouth 3 (three) times daily between meals.  24 Can  6  . ferrous sulfate 325 (65 FE) MG tablet Take 325 mg by mouth daily with breakfast.      . furosemide (LASIX) 40 MG tablet Take 1.5 tablets (60 mg total) by mouth daily.  30 tablet  5  . insulin NPH-regular Human (NOVOLIN 70/30) (70-30) 100 UNIT/ML injection 20 units  in the morning with breakfast and 5 units in the evening with supper  10 mL  11  . Insulin Pen Needle (B-D ULTRAFINE III SHORT PEN) 31G X 8 MM MISC USE AS DIRECTED WITH INSULIN PENS  100 each  2  . metoprolol succinate (TOPROL-XL) 50 MG 24 hr tablet TAKE 1 TABLET BY MOUTH EVERY DAY WITH OR IMMEDIATELY FOLLOWING A MEAL  30 tablet  11  . Multiple Vitamin (MULITIVITAMIN WITH MINERALS) TABS Take 1 tablet by mouth daily.      . pravastatin (PRAVACHOL) 40 MG tablet TAKE ONE (1) TABLET EACH                EVENING  30 tablet  6  . Rivastigmine 13.3 MG/24HR PT24 Place 1 patch (13.3 mg total) onto the skin once.  30 patch  6   No current facility-administered medications for this visit.    Past Medical History  Diagnosis  Date  . Hyperlipidemia   . Hypertension   . Diabetes mellitus   . Chronic kidney disease     stage IV  . RAS (renal artery stenosis)   . Stroke     s/p CVA unknown timing  . Ischemic cardiomyopathy     EF 20%  . Dementia   . CAD (coronary artery disease)     s/p PTCA May 2005, stenting of an intermittent blanch of the CMX and the angioplasty of teh diagonal branch to the left descending    Past Surgical History  Procedure Laterality Date  . Appendectomy    . Total abdominal hysterectomy w/ bilateral salpingoophorectomy    . Angioplasty      and stent    Family History  Problem Relation Age of Onset  . Kidney disease Brother     History   Social History  . Marital Status: Married    Spouse Name: N/A    Number of Children: N/A  . Years of Education: N/A   Occupational History  . Not on file.   Social History Main Topics  . Smoking status: Never Smoker   . Smokeless tobacco: Never Used  . Alcohol Use: No  . Drug Use: No  . Sexual Activity: Not on file   Other Topics Concern  . Not on file   Social History Narrative   Patient gets regular exercise- walking   Lives with husband and son; son manages medications      No FH of colon cancer    Review  of Systems:  All systems reviewed.  They are negative to the above problem except as previously stated.  Vital Signs: BP 140/90  Pulse 69  Wt 130 lb (58.968 kg)  SpO2 98%  Physical Exam Patient is in NAD  Examined in wheelchair HEENT:  Normocephalic, atraumatic. EOMI, PERRLA.  Neck: JVP is normal.  Lungs: clear to auscultation. No rales no wheezes.  Heart: Regular rate and rhythm. Normal S1, S2. No S3.   No significant murmurs. PMI not displaced.  Abdomen:  Supple, nontender. Normal bowel sounds. No masses. No hepatomegaly.  Extremities:   Good distal pulses throughout. No lower extremity edema.  Musculoskeletal :moving all extremities.  Neuro:   alert and oriented x3.  CN II-XII grossly intact.  EKG (5/8)  SR  LVH with ST changes consistent with strain  Occasional PVC Assessment and Plan:  1.  HTN  BP is better today.  Will need to review all records.   I have asked patient's son to get BP cuff and bring in to clinic Question compliance with meds.    2.  CAD  WIll need to review  No symptoms of angina  3.  ICM  Volume status looks pretty good.  4.  DM    5.  HL  On statin.      Addendum (9/3) I have reviewed previous nuclear scan from 2005.  There is no signif change L kidney is small WOuld recomm continued medical Rx  Confirm that patient is compliant with meds  Keep log of BP Bring in cuff.  Maximize medical Rx.  Dorris Carnes

## 2013-11-24 ENCOUNTER — Other Ambulatory Visit: Payer: Self-pay | Admitting: *Deleted

## 2013-11-24 DIAGNOSIS — I10 Essential (primary) hypertension: Secondary | ICD-10-CM

## 2013-11-24 MED ORDER — FUROSEMIDE 40 MG PO TABS: 60.0000 mg | ORAL_TABLET | Freq: Every day | ORAL | Status: DC

## 2013-12-04 ENCOUNTER — Encounter: Payer: Self-pay | Admitting: Gastroenterology

## 2013-12-06 ENCOUNTER — Encounter: Payer: Self-pay | Admitting: Internal Medicine

## 2013-12-06 ENCOUNTER — Ambulatory Visit (INDEPENDENT_AMBULATORY_CARE_PROVIDER_SITE_OTHER): Payer: Medicare Other | Admitting: Internal Medicine

## 2013-12-06 VITALS — BP 182/139 | HR 69 | Temp 98.1°F | Ht 63.0 in | Wt 134.0 lb

## 2013-12-06 DIAGNOSIS — I701 Atherosclerosis of renal artery: Secondary | ICD-10-CM

## 2013-12-06 DIAGNOSIS — Z23 Encounter for immunization: Secondary | ICD-10-CM

## 2013-12-06 DIAGNOSIS — E785 Hyperlipidemia, unspecified: Secondary | ICD-10-CM

## 2013-12-06 DIAGNOSIS — Z0000 Encounter for general adult medical examination without abnormal findings: Secondary | ICD-10-CM

## 2013-12-06 DIAGNOSIS — I10 Essential (primary) hypertension: Secondary | ICD-10-CM

## 2013-12-06 DIAGNOSIS — E119 Type 2 diabetes mellitus without complications: Secondary | ICD-10-CM

## 2013-12-06 LAB — POCT GLYCOSYLATED HEMOGLOBIN (HGB A1C): Hemoglobin A1C: 6.1

## 2013-12-06 LAB — GLUCOSE, CAPILLARY: Glucose-Capillary: 235 mg/dL — ABNORMAL HIGH (ref 70–99)

## 2013-12-06 NOTE — Assessment & Plan Note (Signed)
Flu vaccine given today. 

## 2013-12-06 NOTE — Patient Instructions (Addendum)
We put in a referral for social work to arrange for a home health nurse, ideally, to help with administering the medications. Please continue to take all the medications as prescribed. Please follow up in 1 month in clinic for your blood pressure and diabetes management.   General Instructions:   Please bring your medicines with you each time you come to clinic.  Medicines may include prescription medications, over-the-counter medications, herbal remedies, eye drops, vitamins, or other pills.   Progress Toward Treatment Goals:  Treatment Goal 12/06/2013  Hemoglobin A1C at goal  Blood pressure unchanged    Self Care Goals & Plans:  Self Care Goal 12/06/2013  Manage my medications take my medicines as prescribed; bring my medications to every visit; refill my medications on time  Monitor my health keep track of my blood glucose; bring my glucose meter and log to each visit  Eat healthy foods -  Be physically active -    Home Blood Glucose Monitoring 09/13/2013  Check my blood sugar 2 times a day  When to check my blood sugar before breakfast; before dinner     Care Management & Community Referrals:  Referral 09/13/2013  Referrals made for care management support [facility] case management program; social worker  Referrals made to community resources -

## 2013-12-06 NOTE — Assessment & Plan Note (Signed)
Lab Results  Component Value Date   HGBA1C 6.1 12/06/2013   HGBA1C 8.8 07/13/2013   HGBA1C 7.2 02/22/2013     Assessment: Diabetes control: good control (HgbA1C at goal) Progress toward A1C goal:  at goal Comments: Patient is currently on 70/30 Novolin with 20 units in the morning and 5 units in the evening. Level of adherence is questionable given social situation (please see hypertension problem list for more details). However, repeat A1c is 6.1.  Plan: Medications:  continue current medications Instruction/counseling given: reminded to bring blood glucose meter & log to each visit and reminded to bring medications to each visit Educational resources provided: brochure Self management tools provided: copy of home glucose meter download

## 2013-12-06 NOTE — Assessment & Plan Note (Signed)
BP Readings from Last 3 Encounters:  12/06/13 182/139  11/05/13 140/90  09/13/13 164/91    Lab Results  Component Value Date   NA 137 09/13/2013   K 4.0 09/13/2013   CREATININE 1.95* 09/13/2013    Assessment: Blood pressure control: severely elevated Progress toward BP goal:  unchanged Comments: Patient currently on amlodipine 10 mg, clonidine patch, enalapril 10 mg tablet, furosemide 40 mg tablet, metoprolol XL 50 mg, to the level of medication adherence is unknown. The son who usually takes care of her at home has consistently not been present for clinic visits, and he is unclear and even doubtful that this his brother is providing consistent care at home. Unclear what whether refractory hypertension is secondary to medical nonadherence or not optimal medical regimen.  Plan: Medications:  continue current medications. Social work consult ordered to further assess eligibility for home health aid. Will follow patient closely once medication regimen and adherence is confirmed.

## 2013-12-06 NOTE — Progress Notes (Signed)
Medicine attending: I personally interviewed and briefly examined this patient together with resident physician Dr. Luan Moore and I concur with his evaluation and management plan. Murriel Hopper, M.D., [place]

## 2013-12-06 NOTE — Assessment & Plan Note (Signed)
Patient continues to have hypertension, but level of medical adherence is questionable. Patient being followed by nephrology. Please see hypertension problem documentation for more details.

## 2013-12-06 NOTE — Assessment & Plan Note (Signed)
Patient is prescribed 40 mg pravastatin, but level of adherence is again unclear. Given that the patient is 84, may consider discontinuation of statin therapy once medical adherence issues are resolved.

## 2013-12-06 NOTE — Progress Notes (Signed)
   Subjective:    Patient ID: [name], female    DOB: Jul 25, 1931, 80 y.o.   MRN: [id number]  HPI  [name] is a 80 year old female with history of dementia, type 2 diabetes, hypertension, renal artery stenosis, peripheral vascular disease, stage IV chronic kidney disease, hyperlipidemia presents to clinic for followup.  Please refer to separate problem-list charting for more details.  According to son: Review of Systems  Constitutional: Negative for fever and chills.  HENT: Negative for rhinorrhea and sore throat.   Eyes: Negative for visual disturbance.  Respiratory: Negative for cough and shortness of breath.   Cardiovascular: Negative for chest pain and palpitations.  Gastrointestinal: Negative for nausea, vomiting, abdominal pain, diarrhea, constipation and blood in stool.  Genitourinary: Negative for dysuria and hematuria.  Neurological: Negative for syncope.       Objective:   Physical Exam  Constitutional: She appears well-developed and well-nourished. No distress.  HENT:  Head: Normocephalic and atraumatic.  Eyes: EOM are normal. Pupils are equal, round, and reactive to light. Left eye exhibits no discharge.  Neck: Normal range of motion. Neck supple. No thyromegaly present.  Cardiovascular: Normal rate and regular rhythm.  Exam reveals no gallop and no friction rub.   No murmur heard. Pulmonary/Chest: Effort normal and breath sounds normal. No respiratory distress. She has no wheezes. She has no rales.  Abdominal: Soft. Bowel sounds are normal. She exhibits no distension. There is no tenderness. There is no rebound.  Musculoskeletal: She exhibits no edema.  Neurological: She is alert. No cranial nerve deficit.  Oriented to self. Not oriented to time, place, or purpose.   Skin: Skin is warm and dry. No rash noted.  Psychiatric: She has a normal mood and affect.       Assessment & Plan:  Please refer to separate problem-list charting for more details.

## 2013-12-07 NOTE — Addendum Note (Signed)
Addended by: Sander Nephew F on: 12/07/2013 09:25 AM   Modules accepted: Orders

## 2013-12-07 NOTE — Addendum Note (Signed)
Addended by: Seward Meth on: 12/07/2013 10:39 AM   Modules accepted: Orders

## 2013-12-08 ENCOUNTER — Telehealth: Payer: Self-pay | Admitting: Licensed Clinical Social Worker

## 2013-12-08 NOTE — Telephone Encounter (Signed)
CSW placed call to inquire as to services and resources of which pt may benefit.  CSW placed called to pt.  CSW left message requesting return call. CSW provided contact hours and phone number.  Pt has been referred for [facility] and will inquire as to agency of choice. CSW was able to reach daughter, [name] at number on chart.  [name] is designated as one of the [employer].  Daughter's concern at this time is that pt's son that currently lives with pt is not maintaining appropriate management of Ms. Busey's medication.  [name] states pt's son will "straighten up, when he knows [facility]'s coming".  CSW inquired as to what services pt was receiving.  [name] states pt is linked and receiving care management with [facility].  CSW discussed referral for [facility] RN and SW.  [name] aware and in agreement, states pt has used [facility] in the past.  [name] will be listed as contact for [facility].  Family aware HH is for a limited time for medication education and teaching.   CSW discussed options of AL or NH placement if pt's son is not providing oversight needed.  [name] states family has agreed to keep pt in the community and she will be safe.  CSW informed [name], if family feels as though pt is being neglected a referral to [facility] can be made on behalf of patient.  CSW provided contact number to [facility].   Referral to [facility] completed

## 2013-12-15 ENCOUNTER — Telehealth: Payer: Self-pay | Admitting: Pharmacist

## 2013-12-15 NOTE — Telephone Encounter (Signed)
Tried contacting patient and family for assistance with medication management.  Attempts & messages left: 2.  Unable to reach.

## 2013-12-17 ENCOUNTER — Ambulatory Visit: Payer: Medicare Other | Admitting: Pharmacist

## 2013-12-30 ENCOUNTER — Telehealth: Payer: Self-pay | Admitting: *Deleted

## 2014-01-03 ENCOUNTER — Encounter: Payer: Self-pay | Admitting: Internal Medicine

## 2014-01-03 ENCOUNTER — Ambulatory Visit (INDEPENDENT_AMBULATORY_CARE_PROVIDER_SITE_OTHER): Payer: Medicare Other | Admitting: Internal Medicine

## 2014-01-03 VITALS — BP 191/84 | HR 77 | Temp 98.2°F | Wt 134.1 lb

## 2014-01-03 DIAGNOSIS — E139 Other specified diabetes mellitus without complications: Secondary | ICD-10-CM

## 2014-01-03 DIAGNOSIS — E119 Type 2 diabetes mellitus without complications: Secondary | ICD-10-CM

## 2014-01-03 DIAGNOSIS — I10 Essential (primary) hypertension: Secondary | ICD-10-CM

## 2014-01-03 LAB — BASIC METABOLIC PANEL WITH GFR
BUN: 30 mg/dL — ABNORMAL HIGH (ref 6–23)
CHLORIDE: 102 meq/L (ref 96–112)
CO2: 23 mEq/L (ref 19–32)
Calcium: 8.7 mg/dL (ref 8.4–10.5)
Creat: 2.18 mg/dL — ABNORMAL HIGH (ref 0.50–1.10)
GFR, Est African American: 24 mL/min — ABNORMAL LOW
GFR, Est Non African American: 21 mL/min — ABNORMAL LOW
GLUCOSE: 161 mg/dL — AB (ref 70–99)
Potassium: 3.8 mEq/L (ref 3.5–5.3)
SODIUM: 137 meq/L (ref 135–145)

## 2014-01-03 LAB — LIPID PANEL
Cholesterol: 182 mg/dL (ref 0–200)
HDL: 47 mg/dL (ref >39)
LDL CALC: 94 mg/dL (ref 0–99)
TRIGLYCERIDES: 205 mg/dL — AB (ref <150)
Total CHOL/HDL Ratio: 3.9 Ratio
VLDL: 41 mg/dL — ABNORMAL HIGH (ref 0–40)

## 2014-01-03 NOTE — Patient Instructions (Addendum)
General Instructions: Please take your medications as you are supposed to  It would be important to have the primary care taker come with the patient to her visits Please be sure to bring your glucose meter next time  Please come back in 2 weeks   Thank you for bringing your medicines today. This helps [place] keep you safe from mistakes.   Progress Toward Treatment Goals:  Treatment Goal 01/03/2014  Hemoglobin A1C at goal  Blood pressure deteriorated    Self Care Goals & Plans:  Self Care Goal 01/03/2014  Manage my medications bring my medications to every visit; take my medicines as prescribed; refill my medications on time  Monitor my health -  Eat healthy foods -  Be physically active -    Home Blood Glucose Monitoring 09/13/2013  Check my blood sugar 2 times a day  When to check my blood sugar before breakfast; before dinner     Care Management & Community Referrals:  Referral 09/13/2013  Referrals made for care management support [facility] case management program; social worker  Referrals made to community resources -

## 2014-01-04 NOTE — Assessment & Plan Note (Signed)
Lab Results  Component Value Date   HGBA1C 6.1 12/06/2013   HGBA1C 8.8 07/13/2013   HGBA1C 7.2 02/22/2013     Assessment: Diabetes control: good control (HgbA1C at goal) Progress toward A1C goal:  at goal Comments: No glucose meter today. On insulin NPH-regular 70/30 at 25 units in the morning and 5 units in the evening.  Plan: Medications:  continue current medications Home glucose monitoring: Frequency:   Timing:   Instruction/counseling given: no instruction/counseling  Educational resources provided:   Self management tools provided:   Other plans: follow up with PCP in 2 weeks.

## 2014-01-04 NOTE — Assessment & Plan Note (Signed)
BP Readings from Last 3 Encounters:  01/03/14 191/84  12/06/13 182/139  11/05/13 140/90    Lab Results  Component Value Date   NA 137 01/03/2014   K 3.8 01/03/2014   CREATININE 2.18* 01/03/2014    Assessment: Blood pressure control: moderately elevated Progress toward BP goal:  deteriorated Comments: Medication compliance is poor due to dementia and disjointed social support from her family. It is a very difficult situation to try to ensure medication noncompliance in the setting of these complicating factors.  Plan: Medications:  Amlodipine 10 mg daily. Metoprolol 50 mg daily. Enalapril 30 mg daily. Clonidine patch 0.1 mg every week. Educational resources provided:   Self management tools provided:   Other plans: I did not change her medications on this visit. Encouraged her daughter to coordinate with her primary caretaker to be the one to accompany patient to her office visits. Patient has previously been referred to [facility]. She will require [facility] RN. Discussed with daughter that the patient may benefit from NH placement but apparently family has decided again this option. Currently lives with son. She will follow up with PCP in 2 weeks. Clonidine should be discontinued due to noncompliance and the risk of rebound hypertension. Her Enalapril can be increased from 30 mg daily to 20 mg twice a daily.

## 2014-01-04 NOTE — Progress Notes (Signed)
Patient ID: [name], female   DOB: 06-11-1931, 80 y.o.   MRN: [id number]   Subjective:   HPI: [name] is a 80 y.o. woman with history of dementia, type 2 diabetes, hypertension, renal artery stenosis, peripheral vascular disease, stage IV chronic kidney disease, hyperlipidemia presents to clinic for follow up for HTN.   Reason(s) for this visit: 1. HTN: The patient was last evaluated in the clinic on 12/06/2013. Her blood pressure was found to be severely elevated. She takes amlodipine 10 mg daily, clonidine patch 0.1 mg weekly, Enalapril 30 mg daily, Lasix 40 mg daily and Metoprolol 50 mg daily. Her medication compliance has been complicated by dementia and disjointed social support from her family. She apparently lives with her son who helps her with her medications, but she was accompanied by her daughter who does not live with the patient. It is unclear whether patient actually takes her prescribed medications. Her Clonidine patch, has a date of 12/19/2013 and it appears it hasn't been changed since then. She previously has been referred to social work by her PCP to assist with home health medication management. Her blood pressure today still remains elevated with systolic above 948. Family brings in her medication bottles, but as already mentioned, compliance is questionable. I opted not to change her medications on this visit and encouraged the patient to be brought by her primary caretaker (her sons) during her next visit.   ROS: Constitutional: Denies fever, chills, diaphoresis, appetite change and fatigue.  Respiratory: Denies SOB, DOE, cough, chest tightness, and wheezing. Denies chest pain. CVS: No chest pain, palpitations and leg swelling.  GI: No abdominal pain, nausea, vomiting, bloody stools GU: No dysuria, frequency, hematuria, or flank pain.  MSK: No myalgias, back pain, joint swelling, arthralgias  Psych: No depression symptoms. No SI or SA.    Objective:    Physical Exam: Filed Vitals:   01/03/14 1600 01/03/14 1643  BP: 195/82 191/84  Pulse: 70 77  Temp: 98.2 F (36.8 C)   TempSrc: Oral   Weight: 134 lb 1.6 oz (60.827 kg)   SpO2: 100%    General: Well nourished. No acute distress. Daughter in room. She provides most of the history since patient is demented.  HEENT: Normal oral mucosa. MMM.  Lungs: CTA bilaterally. Heart: RRR; no extra sounds or murmurs  Abdomen: Non-distended, normal bowel sounds, soft, nontender; no hepatosplenomegaly  Extremities: No pedal edema. No joint swelling or tenderness. Neurologic: Normal EOM, No obvious neurologic/cranial nerve deficits.  Assessment & Plan:  Discussed case with my attending in the clinic, Dr. Eppie Gibson See problem based charting.

## 2014-01-04 NOTE — Telephone Encounter (Signed)
closed

## 2014-01-04 NOTE — Progress Notes (Signed)
Case discussed with Dr. Alice Rieger at time of visit. We reviewed the resident's history and exam and pertinent patient test results. I agree with the assessment, diagnosis, and plan of care documented in the resident's note.  Given concerns about compliance could consider weaning off the clonidine in the future while escalating other classes of medications.

## 2014-01-05 LAB — GLUCOSE, CAPILLARY: GLUCOSE-CAPILLARY: 167 mg/dL — AB (ref 70–99)

## 2014-01-18 ENCOUNTER — Telehealth: Payer: Self-pay | Admitting: *Deleted

## 2014-01-18 DIAGNOSIS — I10 Essential (primary) hypertension: Secondary | ICD-10-CM

## 2014-01-18 MED ORDER — ENALAPRIL MALEATE 10 MG PO TABS: 30.0000 mg | ORAL_TABLET | Freq: Every day | ORAL | Status: DC

## 2014-01-18 NOTE — Telephone Encounter (Signed)
[facility] request a refill on Enalapril 10mg  #60 - last filled 12/17/13 and was written 04/02/13. Hilda Blades Clyda Smyth RN 01/18/14 3PM

## 2014-01-28 ENCOUNTER — Ambulatory Visit (INDEPENDENT_AMBULATORY_CARE_PROVIDER_SITE_OTHER): Payer: Medicare Other | Admitting: Internal Medicine

## 2014-01-28 ENCOUNTER — Encounter: Payer: Self-pay | Admitting: Internal Medicine

## 2014-01-28 VITALS — BP 197/94 | HR 72 | Temp 98.0°F | Ht 63.0 in | Wt 135.3 lb

## 2014-01-28 DIAGNOSIS — E139 Other specified diabetes mellitus without complications: Secondary | ICD-10-CM

## 2014-01-28 DIAGNOSIS — I10 Essential (primary) hypertension: Secondary | ICD-10-CM

## 2014-01-28 LAB — GLUCOSE, CAPILLARY: GLUCOSE-CAPILLARY: 155 mg/dL — AB (ref 70–99)

## 2014-01-28 NOTE — Assessment & Plan Note (Signed)
Lab Results  Component Value Date   HGBA1C 6.1 12/06/2013   HGBA1C 8.8 07/13/2013   HGBA1C 7.2 02/22/2013     Assessment: Diabetes control: good control (HgbA1C at goal) Progress toward A1C goal:  at goal Comments: No significant episodes of hypoglycemia  Plan: Medications:  continue current medications: 70/30 at 25 units in the morning and 5 units in the evening

## 2014-01-28 NOTE — Progress Notes (Signed)
   Subjective:    Patient ID: [name], female    DOB: [birth year], 80 y.o.   MRN: [id number]  HPI  Patient is a 80 year old female with history of dementia, type 2 diabetes, hypertension, renal artery stenosis, peripheral vascular disease, stage IV chronic kidney disease, hyperlipidemia who presents to clinic for hypertension follow-up.  Please refer to separate problem-list charting for more details.  Review of Systems  Constitutional: Negative for fever and chills.  HENT: Negative for rhinorrhea and sore throat.   Eyes: Negative for visual disturbance.  Respiratory: Negative for cough and shortness of breath.   Cardiovascular: Negative for chest pain and palpitations.  Gastrointestinal: Negative for nausea, vomiting, abdominal pain, diarrhea, constipation and blood in stool.  Genitourinary: Negative for dysuria and hematuria.  Neurological: Negative for syncope.      Objective:   Physical Exam  Constitutional: She is oriented to person, place, and time. She appears well-developed and well-nourished. No distress.  HENT:  Head: Normocephalic and atraumatic.  Eyes: EOM are normal. Pupils are equal, round, and reactive to light. Left eye exhibits no discharge.  Neck: Normal range of motion. Neck supple. No thyromegaly present.  Cardiovascular: Normal rate and regular rhythm.  Exam reveals no gallop and no friction rub.   No murmur heard. Pulmonary/Chest: Effort normal and breath sounds normal. No respiratory distress. She has no wheezes. She has no rales.  Abdominal: Soft. Bowel sounds are normal. She exhibits no distension. There is no tenderness. There is no rebound.  Musculoskeletal: She exhibits no edema.  Neurological: She is alert and oriented to person, place, and time. No cranial nerve deficit.  Skin: Skin is warm and dry. No rash noted.  Psychiatric:  Although mostly oriented, patient sometimes provides inappropriate answers to questions.      Assessment & Plan:    Please refer to separate problem-list charting for more details.

## 2014-01-28 NOTE — Patient Instructions (Signed)
We will be contacting you regarding further blood pressure measurements at home. We will be obtaining more records from [facility] regarding these checks.   General Instructions:   Thank you for bringing your medicines today. This helps [place] keep you safe from mistakes.   Progress Toward Treatment Goals:  Treatment Goal 01/03/2014  Hemoglobin A1C at goal  Blood pressure deteriorated    Self Care Goals & Plans:  Self Care Goal 01/28/2014  Manage my medications take my medicines as prescribed; bring my medications to every visit; refill my medications on time  Monitor my health keep track of my blood glucose; bring my glucose meter and log to each visit  Eat healthy foods -  Be physically active -    Home Blood Glucose Monitoring 09/13/2013  Check my blood sugar 2 times a day  When to check my blood sugar before breakfast; before dinner     Care Management & Community Referrals:  Referral 09/13/2013  Referrals made for care management support [facility] case management program; social worker  Referrals made to community resources -

## 2014-01-28 NOTE — Assessment & Plan Note (Signed)
BP Readings from Last 3 Encounters:  01/28/14 197/94  01/03/14 191/84  12/06/13 182/139    Lab Results  Component Value Date   NA 137 01/03/2014   K 3.8 01/03/2014   CREATININE 2.18* 01/03/2014    Assessment: Blood pressure control: severely elevated Progress toward BP goal:  unchanged Comments: Long history of repeated clinic visits with elevated blood pressures. Patient has several family members taking care of her at home, although the son that stays with her and is her primary caretaker is usually not present for clinic appointments. That son, Moise Boring, is present for today's clinic visit. He states that home health nursing has come out to the house several times and have measured her blood pressures at home. Consistently, the blood pressures tend to be in the 299M to 426S systolic. He is puzzled as to why the blood pressures tend to be elevated when his mother comes into clinic to have them measured. Upon review of the chart, there is a note from [facility] and from August 2015 that does corroborate this report. Repeated blood pressure measurements in clinic today revealed that patient has severely elevated blood pressures close to the 341D systolic. Son insists that patient has been compliant with all of her medications as he is the one administering them. He also has shown [place] an extensive notebook that documents all of the medication administrations as well as glucose measurements and blood pressure measurements per home health nursing. Family does not have a blood pressure cuff at home that is reliable. Given the high uncertainty regarding the true nature of the patient's blood pressure control, especially in the context of wildly inconsistent readings between home and clinic, would be very hesitant to increase any of the medications that she is on at this point. This visit has been more reassuring than previous visits regarding the level of compliance that the patient has had regarding her  medications, however more clarity is still needed.  Plan: Medications:  continue current medications Educational resources provided: brochure (has information) Self management tools provided:   Other plans: Will try to contact social work along with [facility] and regarding more regular measurements for the patient at home and ensuring that these readings are communicated to the clinic.

## 2014-01-29 NOTE — Progress Notes (Signed)
Internal Medicine Clinic Attending  I saw and evaluated the patient.  I personally confirmed the key portions of the history and exam documented by Dr. Raelene Bott and I reviewed pertinent patient test results.  The assessment, diagnosis, and plan were formulated together and I agree with the documentation in the resident's note.

## 2014-03-10 ENCOUNTER — Encounter: Payer: Self-pay | Admitting: Internal Medicine

## 2014-03-16 ENCOUNTER — Other Ambulatory Visit: Payer: Self-pay | Admitting: Internal Medicine

## 2014-03-21 ENCOUNTER — Other Ambulatory Visit: Payer: Self-pay | Admitting: Internal Medicine

## 2014-03-24 ENCOUNTER — Other Ambulatory Visit: Payer: Self-pay | Admitting: Internal Medicine

## 2014-03-24 DIAGNOSIS — E1051 Type 1 diabetes mellitus with diabetic peripheral angiopathy without gangrene: Secondary | ICD-10-CM | POA: Diagnosis not present

## 2014-03-24 DIAGNOSIS — L603 Nail dystrophy: Secondary | ICD-10-CM | POA: Diagnosis not present

## 2014-03-24 DIAGNOSIS — I739 Peripheral vascular disease, unspecified: Secondary | ICD-10-CM | POA: Diagnosis not present

## 2014-03-27 ENCOUNTER — Other Ambulatory Visit: Payer: Self-pay | Admitting: Internal Medicine

## 2014-03-28 ENCOUNTER — Other Ambulatory Visit: Payer: Self-pay | Admitting: Internal Medicine

## 2014-04-04 ENCOUNTER — Encounter: Payer: Medicare Other | Admitting: Internal Medicine

## 2014-04-05 ENCOUNTER — Other Ambulatory Visit: Payer: Self-pay | Admitting: *Deleted

## 2014-04-06 MED ORDER — CALCITRIOL 0.25 MCG PO CAPS: 0.2500 ug | ORAL_CAPSULE | Freq: Every day | ORAL | Status: DC

## 2014-04-14 ENCOUNTER — Ambulatory Visit (INDEPENDENT_AMBULATORY_CARE_PROVIDER_SITE_OTHER): Payer: Medicare Other | Admitting: Internal Medicine

## 2014-04-14 ENCOUNTER — Encounter: Payer: Self-pay | Admitting: Internal Medicine

## 2014-04-14 VITALS — BP 166/95 | HR 67 | Temp 98.3°F | Ht 63.0 in | Wt 135.0 lb

## 2014-04-14 DIAGNOSIS — E119 Type 2 diabetes mellitus without complications: Secondary | ICD-10-CM

## 2014-04-14 DIAGNOSIS — I10 Essential (primary) hypertension: Secondary | ICD-10-CM

## 2014-04-14 DIAGNOSIS — Z794 Long term (current) use of insulin: Secondary | ICD-10-CM | POA: Diagnosis not present

## 2014-04-14 DIAGNOSIS — E0822 Diabetes mellitus due to underlying condition with diabetic chronic kidney disease: Secondary | ICD-10-CM

## 2014-04-14 DIAGNOSIS — E139 Other specified diabetes mellitus without complications: Secondary | ICD-10-CM

## 2014-04-14 LAB — POCT GLYCOSYLATED HEMOGLOBIN (HGB A1C): Hemoglobin A1C: 6.1

## 2014-04-14 LAB — GLUCOSE, CAPILLARY: GLUCOSE-CAPILLARY: 70 mg/dL (ref 70–99)

## 2014-04-14 MED ORDER — ENALAPRIL MALEATE 20 MG PO TABS: 40.0000 mg | ORAL_TABLET | Freq: Every day | ORAL | Status: DC

## 2014-04-14 MED ORDER — INSULIN NPH ISOPHANE & REGULAR (70-30) 100 UNIT/ML ~~LOC~~ SUSP: SUBCUTANEOUS | Status: DC

## 2014-04-14 NOTE — Patient Instructions (Addendum)
Please take all your medications as prescribed. I have increased your enalapril from 30 mg to 40 mg to better control your blood pressure. Given your good hemoglobin A1c of 6.1, I have reduced you are evening dosage of 70/30 insulin from 5 units to 3 units.  General Instructions:   Thank you for bringing your medicines today. This helps [place] keep you safe from mistakes.   Progress Toward Treatment Goals:  Treatment Goal 01/28/2014  Hemoglobin A1C at goal  Blood pressure unchanged    Self Care Goals & Plans:  Self Care Goal 04/14/2014  Manage my medications take my medicines as prescribed; bring my medications to every visit; refill my medications on time  Monitor my health keep track of my blood glucose; bring my glucose meter and log to each visit; keep track of my blood pressure  Eat healthy foods drink diet soda or water instead of juice or soda; eat more vegetables; eat foods that are low in salt; eat fruit for snacks and desserts; eat baked foods instead of fried foods  Be physically active -    Home Blood Glucose Monitoring 09/13/2013  Check my blood sugar 2 times a day  When to check my blood sugar before breakfast; before dinner     Care Management & Community Referrals:  Referral 09/13/2013  Referrals made for care management support [facility] case management program; social worker  Referrals made to community resources -

## 2014-04-14 NOTE — Assessment & Plan Note (Signed)
BP Readings from Last 3 Encounters:  04/14/14 166/95  01/28/14 197/94  01/03/14 191/84    Lab Results  Component Value Date   NA 137 01/03/2014   K 3.8 01/03/2014   CREATININE 2.18* 01/03/2014    Assessment: Blood pressure control: moderately elevated Progress toward BP goal:  unchanged Comments: Patient's hypertension is complicated by a history of renal artery stenosis status post failed intervention in 2005. Over the last several months, we have had a much better idea of the patient's blood pressure measurements at home compared to previous visits. Additionally, both sons are again here during this clinic visit. According to home health nursing, patient's blood pressures are typically in the 170s over 80s, but are as low as 009 systolic and as high as 233 systolic. There are no reports of low blood pressure readings. Additionally, no reports of symptoms associated with this. At this point, patient warrants up titration of her blood pressure medication regimen. Would be hesitant to increase her beta blocker given her heart rate in the 60s. Additionally, would be hesitant to add another diuretic such as spironolactone given her risk for falls in her current Lasix regimen. Although she has a history of chronic kidney disease, it seems that up titration of her ACE inhibitor may best of non-ideal options. Would expect some rise in creatinine but this up titration may ultimately provide further renal protection.  Plan: Medications:  Increase enalapril from 30 mg daily to 40 mg daily. Otherwise, continue with amlodipine 10 mg daily, clonidine 0.1 mg weekly, Lasix 60 mg daily, and metoprolol 50 mg daily. Educational resources provided: brochure (denies) Self management tools provided:   Other plans: None

## 2014-04-14 NOTE — Progress Notes (Signed)
   Subjective:    Patient ID: [name], female    DOB: [birth year], 81 y.o.   MRN: [id number]  HPI   Patient is an 81 year old female with a history of chronic kidney disease, type 2 diabetes, hypertension, renal artery stenosis, severe dementia who presents to clinic for a follow-up of her blood pressure.  Please refer to separate problem-list charting for more details.   Review of Systems  Constitutional: Negative for fever and chills.  HENT: Negative for rhinorrhea and sore throat.   Eyes: Negative for visual disturbance.  Respiratory: Negative for cough and shortness of breath.   Cardiovascular: Negative for chest pain and palpitations.  Gastrointestinal: Negative for nausea, vomiting, abdominal pain, diarrhea, constipation and blood in stool.  Genitourinary: Negative for dysuria and hematuria.  Neurological: Negative for syncope.       Objective:   Physical Exam  Constitutional: She is oriented to person, place, and time. She appears well-developed and well-nourished. No distress.  HENT:  Head: Normocephalic and atraumatic.  Eyes: EOM are normal. Pupils are equal, round, and reactive to light. Left eye exhibits no discharge.  Neck: Normal range of motion. Neck supple. No thyromegaly present.  Cardiovascular: Normal rate and regular rhythm.  Exam reveals no gallop and no friction rub.   No murmur heard. Pulmonary/Chest: Effort normal and breath sounds normal. No respiratory distress. She has no wheezes. She has no rales.  Abdominal: Soft. Bowel sounds are normal. She exhibits no distension. There is no tenderness. There is no rebound.  Musculoskeletal: She exhibits no edema.  Neurological: She is alert and oriented to person, place, and time. No cranial nerve deficit.  Skin: Skin is warm and dry. No rash noted.  Psychiatric:  Speaks incoherently.          Assessment & Plan:  Please refer to separate problem-list charting for more details.

## 2014-04-14 NOTE — Assessment & Plan Note (Addendum)
Lab Results  Component Value Date   HGBA1C 6.1 04/14/2014   HGBA1C 6.1 12/06/2013   HGBA1C 8.8 07/13/2013     Assessment: Diabetes control: good control (HgbA1C at goal) Progress toward A1C goal:  at goal Comments: Patient with a CBG of 70 here in clinic. There are no readings that are that low according to her blood glucose monitor although her blood glucoses tend to be a bit lower in the mornings.   Plan: Medications:  Decrease evening dosage of 70/30 from 5 units to 3 units. Keep morning dosage of 70/30 20 units. Home glucose monitoring: Frequency:   3 times a day Timing:   before meals Instruction/counseling given: reminded to bring blood glucose meter & log to each visit Educational resources provided: brochure (denies) Self management tools provided: copy of home glucose meter download Other plans: None

## 2014-04-15 NOTE — Progress Notes (Signed)
Internal Medicine Clinic Attending  Case discussed with Dr. Ngo at the time of the visit.  We reviewed the resident's history and exam and pertinent patient test results.  I agree with the assessment, diagnosis, and plan of care documented in the resident's note. 

## 2014-04-22 ENCOUNTER — Other Ambulatory Visit: Payer: Self-pay | Admitting: Internal Medicine

## 2014-04-26 ENCOUNTER — Encounter: Payer: Self-pay | Admitting: Internal Medicine

## 2014-04-27 ENCOUNTER — Encounter: Payer: Self-pay | Admitting: Licensed Clinical Social Worker

## 2014-04-27 NOTE — Progress Notes (Signed)
Patient ID: AZALYNN MAXIM, female   DOB: 04-05-1931, 81 y.o.   MRN: [id number] After reviewing home visit note from [place], there has been a APS report made regarding Ms. Marlowe Aschoff.  APS SW will follow up with [place].  See [facility] Care Management note 04/22/14.

## 2014-05-16 ENCOUNTER — Other Ambulatory Visit: Payer: Self-pay | Admitting: *Deleted

## 2014-05-16 MED ORDER — GLUCOSE BLOOD VI STRP: 1.0000 | ORAL_STRIP | Status: DC | PRN

## 2014-05-17 ENCOUNTER — Telehealth: Payer: Self-pay | Admitting: Pharmacist

## 2014-05-17 NOTE — Telephone Encounter (Signed)
Call to patient to confirm appointment for 05/18/14 at 2:00. lmtcb

## 2014-05-18 ENCOUNTER — Ambulatory Visit: Payer: Medicare Other | Admitting: Pharmacist

## 2014-05-18 DIAGNOSIS — I129 Hypertensive chronic kidney disease with stage 1 through stage 4 chronic kidney disease, or unspecified chronic kidney disease: Secondary | ICD-10-CM | POA: Diagnosis not present

## 2014-05-18 DIAGNOSIS — N183 Chronic kidney disease, stage 3 (moderate): Secondary | ICD-10-CM | POA: Diagnosis not present

## 2014-05-18 DIAGNOSIS — N2581 Secondary hyperparathyroidism of renal origin: Secondary | ICD-10-CM | POA: Diagnosis not present

## 2014-05-18 DIAGNOSIS — Z862 Personal history of diseases of the blood and blood-forming organs and certain disorders involving the immune mechanism: Secondary | ICD-10-CM | POA: Diagnosis not present

## 2014-05-24 ENCOUNTER — Other Ambulatory Visit: Payer: Self-pay | Admitting: Internal Medicine

## 2014-06-02 ENCOUNTER — Other Ambulatory Visit: Payer: Self-pay | Admitting: *Deleted

## 2014-06-02 DIAGNOSIS — I739 Peripheral vascular disease, unspecified: Secondary | ICD-10-CM | POA: Diagnosis not present

## 2014-06-02 DIAGNOSIS — E1051 Type 1 diabetes mellitus with diabetic peripheral angiopathy without gangrene: Secondary | ICD-10-CM | POA: Diagnosis not present

## 2014-06-02 DIAGNOSIS — L603 Nail dystrophy: Secondary | ICD-10-CM | POA: Diagnosis not present

## 2014-06-02 NOTE — Patient Outreach (Signed)
   06/02/2014   [name] November 23, 1931 [id number]  Subjective: Son reports pt is doing good.   Son states Dr. Sanda Klein office called, pt to go back on 40mg  of Enalapril daily and Calcitrol changed to  Daily due to kidney function up.  Son states prior to med change, pt was urinating 4 times in one hour.  Son states pt has not been Walking outdoors due to the pollen.   Objective:  Review of systems done.  Pt has no c/o pain.  Pt agitated because RN CM and son checking her BP.    Assessment:  Reviewed son's BP readings from last home visit on 3/10, starting 3/12- 137/86 in am, 3/14- 152/70 in am, 3/15- 159/81, 3/16- 158/91 in pm, 3/18- 197/96 in am, 3/21-168/90 in pm, 3/23- 164/105 in pm.  BP today- 164/90, with son's BP machine- 171/101 (pt agitated) CBGs per son's recordings: 98-222 in am, 118-227 in pm.    Plan:  Review pt's care plan with son, review as needed.            Work with son toward pt's goals.             Review [facility] calendar for pt's BP and blood sugars.            Continue to notify Dr. Raelene Bott of son's BP readings along with nurse's             Next home visit scheduled for 4/6.

## 2014-06-02 NOTE — Patient Instructions (Signed)
Son to check pt's BP at least 5 days a week, record.   Son to continue to monitor pt's sodium intake.

## 2014-06-03 ENCOUNTER — Ambulatory Visit: Payer: Medicare Other | Admitting: *Deleted

## 2014-06-15 ENCOUNTER — Other Ambulatory Visit: Payer: Self-pay | Admitting: *Deleted

## 2014-06-16 ENCOUNTER — Other Ambulatory Visit: Payer: Self-pay | Admitting: *Deleted

## 2014-06-16 NOTE — Patient Outreach (Signed)
[place] [facility]) Care Management  [name]  06/16/2014   [name] 1931-09-07 [id number]  Subjective:  Son reports pt is tired, dizzy, drowsy, staying in bed (from medicine Lasix,Enalapril,Ferrix). Son  States gets pt up and moving around, thinks she has hay fever (sniffling), pt  rather be lying down to relax.    Objective:  Review of pt's blood sugars averages per pt's glucometer = 7 day-185,14-176,30-167.   Blood sugar  Today 167, ranges 110-227 in am, 98-272 pm.     Recorded BP readings from son within last 2 weeks- Ones taken in am- 185/80, 175/81,198/98,164/86,157/83, 161/75.   Taken in pm- 185/102, 183/110, 120/81 (afternoon).  BP patch in place on pt's back.    Current Medications:  Current Outpatient Prescriptions  Medication Sig Dispense Refill  . amLODipine (NORVASC) 10 MG tablet TAKE ONE (1) TABLET EACH DAY 90 tablet 3  . BD INSULIN SYRINGE ULTRAFINE 31G X 5/16" 0.3 ML MISC USE 1 SYRINGE 2 (TWO) TIMES DAILY AS DIRECTED 60 each 3  . BD INSULIN SYRINGE ULTRAFINE 31G X 5/16" 0.3 ML MISC 1 SYRINGE BY DOES NOT APPLY ROUTE 2 (TWO) TIMES DAILY. 60 each 3  . BD INSULIN SYRINGE ULTRAFINE 31G X 5/16" 0.3 ML MISC 1 SYRINGE BY DOES NOT APPLY ROUTE 2 (TWO) TIMES DAILY. 60 each 6  . Blood Glucose Monitoring Suppl (ACCU-CHEK COMPACT CARE KIT) KIT 1 Device by Does not apply route 3 (three) times daily. 1 each 0  . calcitRIOL (ROCALTROL) 0.25 MCG capsule Take 1 capsule (0.25 mcg total) by mouth daily. 90 capsule 3  . cloNIDine (CATAPRES - DOSED IN MG/24 HR) 0.1 mg/24hr patch Place 1 patch (0.1 mg total) onto the skin once a week. 4 patch 11  . clopidogrel (PLAVIX) 75 MG tablet Take 1 tablet (75 mg total) by mouth daily. 90 tablet 4  . enalapril (VASOTEC) 20 MG tablet Take 2 tablets (40 mg total) by mouth daily. 60 tablet 3  . feeding supplement (GLUCERNA SHAKE) LIQD Take 237 mLs by mouth 3 (three) times daily between meals. 24 Can 6  . ferrous sulfate 325 (65 FE)  MG tablet Take 325 mg by mouth daily with breakfast.    . furosemide (LASIX) 40 MG tablet TAKE 1 AND 1/2 TABLETS (60 MG TOTAL) BY MOUTH DAILY. 90 tablet 3  . glucose blood (ACCU-CHEK AVIVA PLUS) test strip 1 each by Other route as needed for other. Use as instructed 100 each 6  . insulin NPH-regular Human (NOVOLIN 70/30) (70-30) 100 UNIT/ML injection 20 units in the morning with breakfast and 3 units in the evening with supper 10 mL 11  . Insulin Pen Needle (B-D ULTRAFINE III SHORT PEN) 31G X 8 MM MISC USE AS DIRECTED WITH INSULIN PENS 100 each 2  . metoprolol succinate (TOPROL-XL) 50 MG 24 hr tablet TAKE 1 TABLET BY MOUTH EVERY DAY WITH OR IMMEDIATELY FOLLOWING A MEAL 30 tablet 11  . Multiple Vitamin (MULITIVITAMIN WITH MINERALS) TABS Take 1 tablet by mouth daily.    . pravastatin (PRAVACHOL) 40 MG tablet TAKE ONE (1) TABLET EACH                EVENING 30 tablet 6  . Rivastigmine 13.3 MG/24HR PT24 Place 1 patch (13.3 mg total) onto the skin once. 30 patch 6   No current facility-administered medications for this visit.    Assessment:   Health assessment done- son historian   Plan: Son to check pt's BP daily/record  for next 2 weeks.   Son to continue to check pt's blood sugars/record.            RN CM to follow pt again with home visit in 2 weeks, scheduled visit with son for 4/21.     Zara Chess.   [place] [facility] Management  [phone]

## 2014-06-16 NOTE — Patient Outreach (Signed)
Late entry:   4/6- received a return phone call from [name] (pt's daughter, on [name] consent form).   RN CM discussed with daughter SW at Dr. Trudee Kuster office request - who is major decision maker for pt.   [name] states [name] is pt's POA,makes all the decisions, need to talk to her.   [name] states she is the medical, takes pt to MD appointments.  RN CM inquired of daughter if she is designated HCPOA to which said does not have it writing.  RN CM inquired also if both [name] and her numbers could be given to SW to which she said yes.   Plan to f/u with SW.        [name].   [place] [facility] Management  [phone]

## 2014-06-17 DIAGNOSIS — E11329 Type 2 diabetes mellitus with mild nonproliferative diabetic retinopathy without macular edema: Secondary | ICD-10-CM | POA: Diagnosis not present

## 2014-06-17 DIAGNOSIS — H40003 Preglaucoma, unspecified, bilateral: Secondary | ICD-10-CM | POA: Diagnosis not present

## 2014-06-17 DIAGNOSIS — H35371 Puckering of macula, right eye: Secondary | ICD-10-CM | POA: Diagnosis not present

## 2014-06-23 ENCOUNTER — Telehealth: Payer: Self-pay | Admitting: *Deleted

## 2014-06-23 ENCOUNTER — Other Ambulatory Visit: Payer: Self-pay | Admitting: Internal Medicine

## 2014-06-23 MED ORDER — INSULIN NPH ISOPHANE & REGULAR (70-30) 100 UNIT/ML ~~LOC~~ SUSP: SUBCUTANEOUS | Status: DC

## 2014-06-23 MED ORDER — INSULIN LISPRO 100 UNIT/ML ~~LOC~~ SOLN: SUBCUTANEOUS | Status: DC

## 2014-06-23 NOTE — Telephone Encounter (Signed)
Pharmacy calls and states insurance ask for novolin to humulin so they will pay for pt's insulin. Will you change to humulin and send a new script?

## 2014-06-30 ENCOUNTER — Other Ambulatory Visit: Payer: Self-pay | Admitting: *Deleted

## 2014-06-30 VITALS — BP 156/90 | HR 93 | Resp 16

## 2014-07-01 NOTE — Patient Outreach (Signed)
[place] [facility]) Care Management  [place]  07/01/2014   [name] November 04, 1931 [id number]  Subjective:  Son reports pt has been complaining of pain in left shoulder for a week, massaging helps.                       Son states pt's  c/o pain in right wrist today is new.   Son states pt's blood sugar today is                       108, last night 78, sugars been doing good.   Objective:  BP today 180/90 by RN CM.   Son's BP results for pt= 4/8- 149/72, 4/9- 137/70, 4/11-155/80, 4/12-149/72,                      4/13- 174/77, 4/14-175/87, 4/15-171/99, 4/16- 146/95, 4/18- 146/99, 4/20- 174/84, today's 156/90.                       Pt c/o pain in right wrist (pt indicated way up there on pain).      Current Medications:  Current Outpatient Prescriptions  Medication Sig Dispense Refill  . amLODipine (NORVASC) 10 MG tablet TAKE ONE (1) TABLET EACH DAY 90 tablet 3  . Blood Glucose Monitoring Suppl (ACCU-CHEK COMPACT CARE KIT) KIT 1 Device by Does not apply route 3 (three) times daily. 1 each 0  . calcitRIOL (ROCALTROL) 0.25 MCG capsule Take 1 capsule (0.25 mcg total) by mouth daily. 90 capsule 3  . cloNIDine (CATAPRES - DOSED IN MG/24 HR) 0.1 mg/24hr patch Place 1 patch (0.1 mg total) onto the skin once a week. 4 patch 11  . clopidogrel (PLAVIX) 75 MG tablet Take 1 tablet (75 mg total) by mouth daily. 90 tablet 4  . enalapril (VASOTEC) 20 MG tablet Take 2 tablets (40 mg total) by mouth daily. 60 tablet 3  . ferrous sulfate 325 (65 FE) MG tablet Take 325 mg by mouth 2 (two) times daily.    . furosemide (LASIX) 40 MG tablet TAKE 1 AND 1/2 TABLETS (60 MG TOTAL) BY MOUTH DAILY. 90 tablet 3  . glucose blood (ACCU-CHEK AVIVA PLUS) test strip 1 each by Other route as needed for other. Use as instructed 100 each 6  . insulin NPH-regular Human (HUMULIN 70/30) (70-30) 100 UNIT/ML injection 20 units in the morning with breakfast and 3 units in the evening with supper, Normal 10  mL 3  . Insulin Pen Needle (B-D ULTRAFINE III SHORT PEN) 31G X 8 MM MISC USE AS DIRECTED WITH INSULIN PENS 100 each 2  . metoprolol succinate (TOPROL-XL) 50 MG 24 hr tablet TAKE 1 TABLET BY MOUTH EVERY DAY WITH OR IMMEDIATELY FOLLOWING A MEAL 30 tablet 11  . Multiple Vitamin (MULITIVITAMIN WITH MINERALS) TABS Take 1 tablet by mouth daily.    . pravastatin (PRAVACHOL) 40 MG tablet TAKE ONE (1) TABLET EACH                EVENING 30 tablet 6  . Rivastigmine 13.3 MG/24HR PT24 Place 1 patch (13.3 mg total) onto the skin once. 30 patch 6  . BD INSULIN SYRINGE ULTRAFINE 31G X 5/16" 0.3 ML MISC USE 1 SYRINGE 2 (TWO) TIMES DAILY AS DIRECTED (Patient not taking: Reported on 06/30/2014) 60 each 3  . BD INSULIN SYRINGE ULTRAFINE 31G X 5/16" 0.3 ML MISC 1 SYRINGE BY  DOES NOT APPLY ROUTE 2 (TWO) TIMES DAILY. (Patient not taking: Reported on 06/30/2014) 60 each 3  . BD INSULIN SYRINGE ULTRAFINE 31G X 5/16" 0.3 ML MISC 1 SYRINGE BY DOES NOT APPLY ROUTE 2 (TWO) TIMES DAILY. (Patient not taking: Reported on 06/30/2014) 60 each 6  . feeding supplement (GLUCERNA SHAKE) LIQD Take 237 mLs by mouth 3 (three) times daily between meals. (Patient not taking: Reported on 06/30/2014) 24 Can 6  . ferrous sulfate 325 (65 FE) MG tablet Take 325 mg by mouth daily with breakfast.     No current facility-administered medications for this visit.   Assessment:  Right wrist pain.   Plan:   Son to give pt OTC pain med as needed for right wrist or left shoulder pain, if no relief to call MD             Son to continue to check pt's BP, do daily/record.                Pt to f/u 5/16 for lab work and MD office visit.               RN CM to send Dr. Raelene Bott clinical notes from today's home visit.               RN CM to f/u with pt 5/5 (home visit)  Zara Chess.   [place] Care Management  [phone]

## 2014-07-11 ENCOUNTER — Telehealth: Payer: Self-pay | Admitting: *Deleted

## 2014-07-11 ENCOUNTER — Encounter: Payer: Self-pay | Admitting: *Deleted

## 2014-07-11 ENCOUNTER — Other Ambulatory Visit: Payer: Self-pay | Admitting: Internal Medicine

## 2014-07-11 NOTE — Telephone Encounter (Signed)
Daughter called about Accuck test strips - [facility] states Rx was written #90 - which should last 3 months. Pt's son was told to check CBG three times per day. Will need new Rx with directions.  Hilda Blades Dailyn Kempner RN 07/11/14 4:45PM

## 2014-07-12 ENCOUNTER — Other Ambulatory Visit: Payer: Self-pay | Admitting: *Deleted

## 2014-07-12 ENCOUNTER — Other Ambulatory Visit: Payer: Self-pay | Admitting: Internal Medicine

## 2014-07-12 DIAGNOSIS — E139 Other specified diabetes mellitus without complications: Secondary | ICD-10-CM

## 2014-07-12 MED ORDER — GLUCOSE BLOOD VI STRP: ORAL_STRIP | Status: DC

## 2014-07-12 NOTE — Telephone Encounter (Signed)
RTC to patient's son.  Problem with strips has been resolved.  To go to pharmacy to pick up strips.  Spoke with Pharmacist who said that the prescription would need to be more specific in the directions to say 3 times a day and not as directed. If written as directed patient will only receive 100 strips every 3 months and not monthly. New prescription was done and sent to Dr. Raelene Bott.   Sander Nephew, RN  07/11/2014 11:31 AM

## 2014-07-12 NOTE — Telephone Encounter (Signed)
Talked with daughter - problem with strips was taken care by G. Herbin.

## 2014-07-12 NOTE — Telephone Encounter (Signed)
Patient was last given Accucheck prescription on 05/16/2014 for #100 with 6 refills, for which she should still have enough. I cannot find one that is written #90. Is there a different prescription that I am missing?

## 2014-07-14 ENCOUNTER — Other Ambulatory Visit: Payer: Self-pay | Admitting: *Deleted

## 2014-07-14 VITALS — BP 160/82 | HR 86 | Resp 20

## 2014-07-14 DIAGNOSIS — I10 Essential (primary) hypertension: Secondary | ICD-10-CM

## 2014-07-15 NOTE — Patient Outreach (Signed)
[place]) Care Management  [place]  07/15/2014   [name] 21-Sep-1931 [id number]  Subjective: Son reports pain in pt's right wrist  (reported 2 weeks ago to RN CM) is better, did Not have to give any pain med.  Son states received a letter visit with Dr. Raelene Bott was cancelled, no Call when rescheduled.  Son states pt's blood sugar has been up since changing to Humulin Insulin around 2 weeks ago.  Son states there is no change in pt's diet.  Son states today pt's Blood sugar 211.    Objective:  View of pt's recorded blood sugars: ranges 83- 223 in am, 45 (one time)-255 in pm.                     BP readings (son checking): 4/22- 169/85, 4/23-118/61, 4/25- 222/115, 4/26- 172/57,                          4/27- 159/81, 4/28- 204/103 (reports mad at son), 45/2- 150/87, 5/3- 163/75, 5/4- 160/76                         5/5- 174/88 at 10am, recheck at 12:51pm- 166/88.  Filed Vitals:   07/14/14 1245  BP: 160/82  Pulse: 86  Resp: 20    Current Medications:  Current Outpatient Prescriptions  Medication Sig Dispense Refill  . amLODipine (NORVASC) 10 MG tablet TAKE ONE (1) TABLET EACH DAY 90 tablet 3  . BD INSULIN SYRINGE ULTRAFINE 31G X 5/16" 0.3 ML MISC USE 1 SYRINGE 2 (TWO) TIMES DAILY AS DIRECTED 60 each 3  . Blood Glucose Monitoring Suppl (ACCU-CHEK COMPACT CARE KIT) KIT 1 Device by Does not apply route 3 (three) times daily. 1 each 0  . calcitRIOL (ROCALTROL) 0.25 MCG capsule Take 1 capsule (0.25 mcg total) by mouth daily. 90 capsule 3  . cloNIDine (CATAPRES - DOSED IN MG/24 HR) 0.1 mg/24hr patch Place 1 patch (0.1 mg total) onto the skin once a week. 4 patch 11  . clopidogrel (PLAVIX) 75 MG tablet Take 1 tablet (75 mg total) by mouth daily. 90 tablet 4  . enalapril (VASOTEC) 20 MG tablet Take 2 tablets (40 mg total) by mouth daily. 60 tablet 3  . ferrous sulfate 325 (65 FE) MG tablet Take 325 mg by mouth daily with breakfast.    . furosemide (LASIX) 40 MG  tablet TAKE 1 AND 1/2 TABLETS (60 MG TOTAL) BY MOUTH DAILY. 90 tablet 3  . glucose blood (ACCU-CHEK AVIVA PLUS) test strip Use to test blood glucose 3 times daily. Dx: E13.9 100 each 12  . insulin NPH-regular Human (HUMULIN 70/30) (70-30) 100 UNIT/ML injection 20 units in the morning with breakfast and 3 units in the evening with supper, Normal 10 mL 3  . Insulin Pen Needle (B-D ULTRAFINE III SHORT PEN) 31G X 8 MM MISC USE AS DIRECTED WITH INSULIN PENS 100 each 2  . metoprolol succinate (TOPROL-XL) 50 MG 24 hr tablet TAKE 1 TABLET BY MOUTH EVERY DAY WITH OR IMMEDIATELY FOLLOWING A MEAL 30 tablet 11  . Multiple Vitamin (MULITIVITAMIN WITH MINERALS) TABS Take 1 tablet by mouth daily.    . pravastatin (PRAVACHOL) 40 MG tablet TAKE ONE (1) TABLET EACH                EVENING 30 tablet 6  . Rivastigmine 13.3 MG/24HR PT24 Place 1 patch (13.3  mg total) onto the skin once. 30 patch 6  . BD INSULIN SYRINGE ULTRAFINE 31G X 5/16" 0.3 ML MISC 1 SYRINGE BY DOES NOT APPLY ROUTE 2 (TWO) TIMES DAILY. (Patient not taking: Reported on 06/30/2014) 60 each 3  . BD INSULIN SYRINGE ULTRAFINE 31G X 5/16" 0.3 ML MISC 1 SYRINGE BY DOES NOT APPLY ROUTE 2 (TWO) TIMES DAILY. (Patient not taking: Reported on 06/30/2014) 60 each 6  . feeding supplement (GLUCERNA SHAKE) LIQD Take 237 mLs by mouth 3 (three) times daily between meals. (Patient not taking: Reported on 06/30/2014) 24 Can 6  . ferrous sulfate 325 (65 FE) MG tablet Take 325 mg by mouth 2 (two) times daily.     No current facility-administered medications for this visit.     Assessment:  HTN-  BP still elevated but seeing some normal readings.                           DM-  Fluctuation in blood sugars, son reports going up since change to Humulin.    Plan:  Son to continue to check pt's  blood sugars bid, record, call MD if continue to elevate.            Son to continue to check pt's BP, record, provide results to RN CM in 2 weeks.             Plan to continue to  provide community nurse case management services, next h/v 5/19.             Plan to update care plan as needed.            Plan to send Dr. Raelene Bott 5/5 encounter.   [facility] CM Care Plan        Most Recent Value   Problem One    [facility] CM Care Plan Problem One     Care Plan for Problem One  Active   Patient Has Long Term Goal?  Yes   [facility] Long Term Goal (31-90 days)  Better control of pt's BP    [facility] Long Term Goal Start Date  06/02/14   Interventions for Problem One Long Term Goal  Discussed with son to continue to check pt's BP, importance with med change of Enalapril decreased.   Problem One Short Term Goals    Number of Short Term Goals for Problem One  Three   Short Term Goal #1    Short Term Goal #2    [facility] CM Short Term Goal #2 Met Date  06/15/14   Short Term Goal #3    [facility] CM Short Term Goal #3 Met Date  -- [not met- son missed 4 days of checking pt's BP ]   Short Term Goal #4    [facility] CM Short Term Goal #4 (0-30 days)  son to continue to check pt's BP for next 14  days as MD appointment was cancelled for 5/16.  MD has requested RN CM contiinue to f/u with pt every 2 weeks until her next f/u visit    [facility] CM Short Term Goal #4 Start Date  07/14/14   Interventions for Short Term Goal #4  Discussed with son to continue to check pt's BP/record.     Short Tern Goal #5    Problem Two    Care Plan Problem Two  pain in shoulder.   Role Documenting the Problem Two  Care Management Coordinator   EMMI for Problem  Two  No   [facility] CM Care Plan Problem Two    Care Plan for Problem Two  Not Active   Problem Two Short Term Goals    Number of Short Term Goal for Problem Two  One   Short Term Goal #1    [facility] CM Short Term Goal #1 (0-30 days)  pt would receive relief from left shoulder and right hand pain  in next 14 days    [facility] CM Short Term Goal #1 Start Date  07/14/14   Short Term Goal #2    Short Term Goal #3    Short Term Goal #4    Short Term Goal #5    Problem Three    Care Plan Problem Three   blood sugars going up since change to Humulin insulin around 2 weeks ago    Role Documenting the Problem Three  Care Management Coordinator   EMMI for Problem Three  No   [facility] CM Care Plan Problem Three    Care Plan for Problem Three  Active   Patient Has Long Term Goal for Problem Three?  No   Problem Three Short Term Goals    Number of Short Term Goals for Problem Three  One   Short Term Goal #1    [facility] CM Short Term Goal #1 (0-30 days)  son to continue to check pt's blood sugars twice a day.record/call MD if continue to be high.    Short Term Goal #2    Short Term Goal #3    Short Term Goal #4    Short Term Goal #5      Kyrstan Gotwalt M.   [place] Care Management  [phone]

## 2014-07-19 ENCOUNTER — Encounter: Payer: Self-pay | Admitting: *Deleted

## 2014-07-25 ENCOUNTER — Encounter: Payer: Medicare Other | Admitting: Internal Medicine

## 2014-07-25 ENCOUNTER — Ambulatory Visit: Payer: Medicare Other | Admitting: Pharmacist

## 2014-07-28 ENCOUNTER — Ambulatory Visit: Payer: Medicare Other | Admitting: Pharmacist

## 2014-07-31 ENCOUNTER — Other Ambulatory Visit: Payer: Self-pay | Admitting: Internal Medicine

## 2014-08-03 ENCOUNTER — Other Ambulatory Visit: Payer: Self-pay | Admitting: *Deleted

## 2014-08-04 ENCOUNTER — Other Ambulatory Visit: Payer: Self-pay | Admitting: *Deleted

## 2014-08-04 VITALS — BP 170/90 | HR 77 | Resp 20

## 2014-08-04 DIAGNOSIS — I10 Essential (primary) hypertension: Secondary | ICD-10-CM

## 2014-08-04 MED ORDER — CLOPIDOGREL BISULFATE 75 MG PO TABS: 75.0000 mg | ORAL_TABLET | Freq: Every day | ORAL | Status: DC

## 2014-08-04 NOTE — Patient Outreach (Signed)
[facility]) Care Management  [place]  08/04/2014   [name] 08-24-1931 [id number]  Subjective: Son reports received a letter pt will be getting  a new MD- Dr. [name] (Dr. Raelene Bott Done with residency), to call MD office in July to schedule pt's appointment.   Son states Humulin  Insulin is weaker then Novolin, blood sugars elevated since the switch, started increasing pt's  Am dosage from 20 units to 25 units, pm dosage most nights from 3 units to 5 units, seeing sugars  Come down.   Pt states he wanted to talk to MD about this but appointment was cancelled.  Son states  Continues to check pt's BP, record, realized cannot give pt Rice a roni (sugar and salt).   Son states Pt to f/u with kidney MD in September.   Objective:  Review of pt's sugars readings, range 109-234 am, 94-342 (one time)pm.                      Review of pt's BP readings- 137/82 to 225/112.  (other high readings- 201/104,208/99, 197/106,                     215/113,202/112, 173/110).   Filed Vitals:   08/04/14 1321  BP: 170/90  Pulse: 77  Resp: 20     Current Medications:  Current Outpatient Prescriptions  Medication Sig Dispense Refill  . amLODipine (NORVASC) 10 MG tablet TAKE ONE (1) TABLET EACH DAY 90 tablet 3  . BD INSULIN SYRINGE ULTRAFINE 31G X 5/16" 0.3 ML MISC 1 SYRINGE BY DOES NOT APPLY ROUTE 2 (TWO) TIMES DAILY. 60 each 3  . Blood Glucose Monitoring Suppl (ACCU-CHEK COMPACT CARE KIT) KIT 1 Device by Does not apply route 3 (three) times daily. 1 each 0  . calcitRIOL (ROCALTROL) 0.25 MCG capsule Take 1 capsule (0.25 mcg total) by mouth daily. 90 capsule 3  . cloNIDine (CATAPRES - DOSED IN MG/24 HR) 0.1 mg/24hr patch PLACE 1 PATCH (0.1 MG TOTAL) ONTO THE SKIN ONCE A WEEK. 4 patch 6  . clopidogrel (PLAVIX) 75 MG tablet Take 1 tablet (75 mg total) by mouth daily. 90 tablet 4  . enalapril (VASOTEC) 20 MG tablet Take 2 tablets (40 mg total) by mouth daily. 60 tablet 3  .  ferrous sulfate 325 (65 FE) MG tablet Take 325 mg by mouth 2 (two) times daily.    . furosemide (LASIX) 40 MG tablet TAKE 1 AND 1/2 TABLETS (60 MG TOTAL) BY MOUTH DAILY. 90 tablet 3  . insulin NPH-regular Human (HUMULIN 70/30) (70-30) 100 UNIT/ML injection 20 units in the morning with breakfast and 3 units in the evening with supper, Normal 10 mL 3  . metoprolol succinate (TOPROL-XL) 50 MG 24 hr tablet TAKE 1 TABLET BY MOUTH EVERY DAY WITH OR IMMEDIATELY FOLLOWING A MEAL 30 tablet 11  . Multiple Vitamin (MULITIVITAMIN WITH MINERALS) TABS Take 1 tablet by mouth daily.    . pravastatin (PRAVACHOL) 40 MG tablet TAKE ONE (1) TABLET EACH                EVENING 30 tablet 6  . BD INSULIN SYRINGE ULTRAFINE 31G X 5/16" 0.3 ML MISC USE 1 SYRINGE 2 (TWO) TIMES DAILY AS DIRECTED 60 each 3  . BD INSULIN SYRINGE ULTRAFINE 31G X 5/16" 0.3 ML MISC 1 SYRINGE BY DOES NOT APPLY ROUTE 2 (TWO) TIMES DAILY. (Patient not taking: Reported on 06/30/2014) 60 each 6  .  feeding supplement (GLUCERNA SHAKE) LIQD Take 237 mLs by mouth 3 (three) times daily between meals. (Patient not taking: Reported on 06/30/2014) 24 Can 6  . ferrous sulfate 325 (65 FE) MG tablet Take 325 mg by mouth daily with breakfast.    . glucose blood (ACCU-CHEK AVIVA PLUS) test strip Use to test blood glucose 3 times daily. Dx: E13.9 (Patient not taking: Reported on 08/04/2014) 100 each 12  . Insulin Pen Needle (B-D ULTRAFINE III SHORT PEN) 31G X 8 MM MISC USE AS DIRECTED WITH INSULIN PENS (Patient not taking: Reported on 07/15/2014) 100 each 2  . Rivastigmine 13.3 MG/24HR PT24 Place 1 patch (13.3 mg total) onto the skin once. (Patient not taking: Reported on 08/04/2014) 30 patch 6   No current facility-administered medications for this visit.    Assessment:  DM- blood sugars down since son increased pt's insulin dosage.  RN CM discussed with pt  Need to talk to MD before increasing pt's insulin.   HTN-  Review of pt's BP readings-  has several elevated   before and after medication given.  Feel pt would benefit from pharmacy referral.  Noted in Epic, pt has an  Appointment with Dr. [name] 6/9 which son was not aware.      Plan:  Son to continue to check pt's blood sugars/BP/record both/bring to next MD office visit .             RN CM called MD's office, confirmed pt's appointment with new MD, relayed this  to son.             RN CM to refer pt to pharmacy- medication review.             Continue to provide community nurse case management services, f/u again 6/16.              Plan to update care plan as needed.   [facility] CM Care Plan Problem One        Patient Outreach from 08/04/2014 in [place] for Problem One  Active   THN Long Term Goal (31-90 days)  Better control of pt's BP in the next 90 days    THN Long Term Goal Start Date  06/02/14   Interventions for Problem One Long Term Goal  Discussed with son to continue to check pt's BP, importance with med change of Enalapril decreased.   THN CM Short Term Goal #2 Met Date  06/15/14   [facility]N CM Short Term Goal #3 (0-30 days)  Son to check pt's BP daily/record daily for the next 2 weeks.    THN CM Short Term Goal #3 Start Date  06/15/14   THN CM Short Term Goal #3 Met Date  -- [not met- son missed 4 days of checking pt's BP ]   THN CM Short Term Goal #4 (0-30 days)  son to continue to check pt's BP for next 14  days as MD appointment was cancelled for 5/16.  MD has requested RN CM contiinue to f/u with pt every 2 weeks until her next f/u visit    THN CM Short Term Goal #4 Start Date  07/14/14   [facility] CM Short Term Goal #4 Met Date  -- [met 5/19 with view of pt's recorded BP readings ]   Interventions for Short Term Goal #4  Discussed with son to continue to check pt's BP/record.     THN CM Short Term Goal #5 (0-30 days)  Son to monitor pt's sodium intake in the next 30 days    THN CM Short Term Goal #5 Start Date  08/04/14   Interventions for Short Term Goal #5  Reviewed  with son foods to avoid high in sodium (processed)     [facility] CM Care Plan Problem Two        Patient Outreach from 07/14/2014 in [place] Problem Two  pain in shoulder.   Care Plan for Problem Two  Not Active   THN CM Short Term Goal #1 (0-30 days)  pt would receive relief from left shoulder and right hand pain  in next 14 days    THN CM Short Term Goal #1 Start Date  07/14/14    [facility] CM Care Plan Problem Three        Patient Outreach from 08/04/2014 in [place] Problem Three  blood sugars going up since change to Humulin insulin around 2 weeks ago    Care Plan for Problem Three  Active   THN CM Short Term Goal #1 (0-30 days)  son to continue to check pt's blood sugars twice a day.record/call MD if continue to be high.  [started 5/5. ]   THN CM Short Term Goal #1 Start Date  07/14/14   THN CM Short Term Goal #1 Met Date  08/04/14   Interventions for Short Term Goal #1  5/5- discussed continuing to check pt's blood sugars twice a day/record/call MD if elevated.

## 2014-08-09 ENCOUNTER — Encounter: Payer: Self-pay | Admitting: Pharmacist

## 2014-08-09 NOTE — Patient Outreach (Signed)
[facility]) Care Management  [place]   08/09/2014  [name] [birth year] [id number]  Subjective: [name] is a 81 y.o. female who was referred to [place] for a medication review.   Patient has difficult to control hypertension. He is currently in transition between physicians as his previous physician will be completing residency and moving.   Objective:   Current Medications: Current Outpatient Prescriptions  Medication Sig Dispense Refill  . amLODipine (NORVASC) 10 MG tablet TAKE ONE (1) TABLET EACH DAY 90 tablet 3  . BD INSULIN SYRINGE ULTRAFINE 31G X 5/16" 0.3 ML MISC USE 1 SYRINGE 2 (TWO) TIMES DAILY AS DIRECTED 60 each 3  . BD INSULIN SYRINGE ULTRAFINE 31G X 5/16" 0.3 ML MISC 1 SYRINGE BY DOES NOT APPLY ROUTE 2 (TWO) TIMES DAILY. 60 each 3  . BD INSULIN SYRINGE ULTRAFINE 31G X 5/16" 0.3 ML MISC 1 SYRINGE BY DOES NOT APPLY ROUTE 2 (TWO) TIMES DAILY. (Patient not taking: Reported on 06/30/2014) 60 each 6  . Blood Glucose Monitoring Suppl (ACCU-CHEK COMPACT CARE KIT) KIT 1 Device by Does not apply route 3 (three) times daily. 1 each 0  . calcitRIOL (ROCALTROL) 0.25 MCG capsule Take 1 capsule (0.25 mcg total) by mouth daily. 90 capsule 3  . cloNIDine (CATAPRES - DOSED IN MG/24 HR) 0.1 mg/24hr patch PLACE 1 PATCH (0.1 MG TOTAL) ONTO THE SKIN ONCE A WEEK. 4 patch 6  . clopidogrel (PLAVIX) 75 MG tablet Take 1 tablet (75 mg total) by mouth daily. 90 tablet 4  . enalapril (VASOTEC) 20 MG tablet Take 2 tablets (40 mg total) by mouth daily. 60 tablet 3  . feeding supplement (GLUCERNA SHAKE) LIQD Take 237 mLs by mouth 3 (three) times daily between meals. (Patient not taking: Reported on 06/30/2014) 24 Can 6  . ferrous sulfate 325 (65 FE) MG tablet Take 325 mg by mouth daily with breakfast.    . ferrous sulfate 325 (65 FE) MG tablet Take 325 mg by mouth 2 (two) times daily.    . furosemide (LASIX) 40 MG tablet TAKE 1 AND 1/2 TABLETS (60  MG TOTAL) BY MOUTH DAILY. 90 tablet 3  . glucose blood (ACCU-CHEK AVIVA PLUS) test strip Use to test blood glucose 3 times daily. Dx: E13.9 (Patient not taking: Reported on 08/04/2014) 100 each 12  . insulin NPH-regular Human (HUMULIN 70/30) (70-30) 100 UNIT/ML injection 20 units in the morning with breakfast and 3 units in the evening with supper, Normal 10 mL 3  . Insulin Pen Needle (B-D ULTRAFINE III SHORT PEN) 31G X 8 MM MISC USE AS DIRECTED WITH INSULIN PENS (Patient not taking: Reported on 07/15/2014) 100 each 2  . metoprolol succinate (TOPROL-XL) 50 MG 24 hr tablet TAKE 1 TABLET BY MOUTH EVERY DAY WITH OR IMMEDIATELY FOLLOWING A MEAL 30 tablet 11  . Multiple Vitamin (MULITIVITAMIN WITH MINERALS) TABS Take 1 tablet by mouth daily.    . pravastatin (PRAVACHOL) 40 MG tablet TAKE ONE (1) TABLET EACH                EVENING 30 tablet 6  . Rivastigmine 13.3 MG/24HR PT24 Place 1 patch (13.3 mg total) onto the skin once. (Patient not taking: Reported on 08/04/2014) 30 patch 6   No current facility-administered medications for this visit.    Functional Status: In your present state of health, do you have any difficulty performing the following activities: 07/01/2014 04/14/2014  Hearing? N N  Vision? -  N  Difficulty concentrating or making decisions? [name]  Walking or climbing stairs? N N  Dressing or bathing? N N  Doing errands, shopping? [name]  Preparing Food and eating ? Y -  Using the Toilet? N -  In the past six months, have you accidently leaked urine? Y -  Do you have problems with loss of bowel control? N -  Managing your Medications? Y -  Managing your Finances? Y -  Housekeeping or managing your Housekeeping? Y -    Fall/Depression Screening: PHQ 2/9 Scores 04/14/2014 01/28/2014 01/03/2014 12/06/2013 09/13/2013 08/24/2013 04/05/2013  PHQ - 2 Score 0 0 0 0 0 0 0    Assessment:  Drugs sorted by system:  Neurologic/Psychologic: rivastigmine   Cardiovascular: amlodipine 21m, clonidine  0.17m24hr patch, enalapril 40 mg DAILY, furosemide 60 mg, metoprolol succinate 50 mg, clopidogrel, pravastatin  Pulmonary/Allergy: none  Gastrointestinal: feeding supplements  Endocrine: Humulin 70/30  Renal: calcitriol  Topical: none  Pain: none  Miscellaneous: ferrous sulfate, multivitamin    Duplications in therapy: none Gaps in therapy: no aspirin needed as patient on clopidogrel Medications to avoid in the elderly: none  Drug interactions: none Other issues noted: enalapril should be dosed twice daily and not once daily in patients with heart failure, however once daily dosing is appropriate in hypertensive patients.    Plan: 1. Medication review: all medications were reviewed and appear to be appropriate for the patient based on diagnoses. Clonidine and metoprolol could be increased if needed for further blood pressure control. Physician could also consider the addition of spironolactone in this patient but would need to assess current renal function to determine if it could be used. Will notify JeMannie StabilePharmD, [facility] of these findings as Dr. KrRandell Patientas not seen patient yet. Will close out of pharmacy program.   StNicoletta BaPharmD, [facility] Resident [phone]

## 2014-08-10 ENCOUNTER — Telehealth: Payer: Self-pay | Admitting: Pharmacist

## 2014-08-16 NOTE — Telephone Encounter (Signed)
Tried contacting patient to schedule appointment for medication review.

## 2014-08-17 ENCOUNTER — Telehealth: Payer: Self-pay | Admitting: Pharmacist

## 2014-08-17 NOTE — Telephone Encounter (Signed)
Call to patient to confirm appointment for 08/18/14 at 4:30 lmtcb

## 2014-08-18 ENCOUNTER — Encounter: Payer: Self-pay | Admitting: Pulmonary Disease

## 2014-08-18 ENCOUNTER — Ambulatory Visit (INDEPENDENT_AMBULATORY_CARE_PROVIDER_SITE_OTHER): Payer: Medicare Other | Admitting: Pulmonary Disease

## 2014-08-18 ENCOUNTER — Ambulatory Visit: Payer: Medicare Other | Admitting: Pharmacist

## 2014-08-18 VITALS — BP 203/101 | HR 66 | Temp 97.9°F | Ht 64.0 in | Wt 132.9 lb

## 2014-08-18 DIAGNOSIS — I10 Essential (primary) hypertension: Secondary | ICD-10-CM

## 2014-08-18 DIAGNOSIS — Z794 Long term (current) use of insulin: Secondary | ICD-10-CM

## 2014-08-18 DIAGNOSIS — E139 Other specified diabetes mellitus without complications: Secondary | ICD-10-CM

## 2014-08-18 DIAGNOSIS — Z719 Counseling, unspecified: Secondary | ICD-10-CM

## 2014-08-18 LAB — POCT GLYCOSYLATED HEMOGLOBIN (HGB A1C): HEMOGLOBIN A1C: 5.6

## 2014-08-18 LAB — GLUCOSE, CAPILLARY: Glucose-Capillary: 145 mg/dL — ABNORMAL HIGH (ref 65–99)

## 2014-08-18 MED ORDER — CLONIDINE HCL 0.2 MG/24HR TD PTWK: 0.2000 mg | MEDICATED_PATCH | TRANSDERMAL | Status: DC

## 2014-08-18 MED ORDER — INSULIN REGULAR HUMAN 100 UNIT/ML IJ SOLN: INTRAMUSCULAR | Status: DC

## 2014-08-18 NOTE — Progress Notes (Signed)
Subjective:   Patient ID: [name], female    DOB: 22-Jul-1931, 81 y.o.   MRN: [id number]  HPI [name] is an 81 year old woman with history of HLD, HTN, DM2, CKD, CVA, CAD presenting for follow up.  Her two sons and daughter are present. They report she has not had any complaints or problems. Unable to interview patient as she has dementia and is unaware of her symptoms.  Review of Systems Unable to obtain 2/2 mental status  Past Medical History  Diagnosis Date  . Hyperlipidemia   . Hypertension   . Diabetes mellitus   . Chronic kidney disease     stage IV  . RAS (renal artery stenosis)   . Stroke     s/p CVA unknown timing  . Ischemic cardiomyopathy     EF 20%  . Dementia   . CAD (coronary artery disease)     s/p PTCA May 2005, stenting of an intermittent blanch of the CMX and the angioplasty of teh diagonal branch to the left descending    Current Outpatient Prescriptions on File Prior to Visit  Medication Sig Dispense Refill  . amLODipine (NORVASC) 10 MG tablet TAKE ONE (1) TABLET EACH DAY 90 tablet 3  . BD INSULIN SYRINGE ULTRAFINE 31G X 5/16" 0.3 ML MISC USE 1 SYRINGE 2 (TWO) TIMES DAILY AS DIRECTED 60 each 3  . BD INSULIN SYRINGE ULTRAFINE 31G X 5/16" 0.3 ML MISC 1 SYRINGE BY DOES NOT APPLY ROUTE 2 (TWO) TIMES DAILY. 60 each 3  . BD INSULIN SYRINGE ULTRAFINE 31G X 5/16" 0.3 ML MISC 1 SYRINGE BY DOES NOT APPLY ROUTE 2 (TWO) TIMES DAILY. (Patient not taking: Reported on 06/30/2014) 60 each 6  . Blood Glucose Monitoring Suppl (ACCU-CHEK COMPACT CARE KIT) KIT 1 Device by Does not apply route 3 (three) times daily. 1 each 0  . calcitRIOL (ROCALTROL) 0.25 MCG capsule Take 1 capsule (0.25 mcg total) by mouth daily. 90 capsule 3  . cloNIDine (CATAPRES - DOSED IN MG/24 HR) 0.1 mg/24hr patch PLACE 1 PATCH (0.1 MG TOTAL) ONTO THE SKIN ONCE A WEEK. 4 patch 6  . clopidogrel (PLAVIX) 75 MG tablet Take 1 tablet (75 mg total) by mouth daily. 90 tablet 4  . enalapril  (VASOTEC) 20 MG tablet Take 2 tablets (40 mg total) by mouth daily. 60 tablet 3  . feeding supplement (GLUCERNA SHAKE) LIQD Take 237 mLs by mouth 3 (three) times daily between meals. (Patient not taking: Reported on 06/30/2014) 24 Can 6  . ferrous sulfate 325 (65 FE) MG tablet Take 325 mg by mouth daily with breakfast.    . ferrous sulfate 325 (65 FE) MG tablet Take 325 mg by mouth 2 (two) times daily.    . furosemide (LASIX) 40 MG tablet TAKE 1 AND 1/2 TABLETS (60 MG TOTAL) BY MOUTH DAILY. 90 tablet 3  . glucose blood (ACCU-CHEK AVIVA PLUS) test strip Use to test blood glucose 3 times daily. Dx: E13.9 (Patient not taking: Reported on 08/04/2014) 100 each 12  . insulin NPH-regular Human (HUMULIN 70/30) (70-30) 100 UNIT/ML injection 20 units in the morning with breakfast and 3 units in the evening with supper, Normal 10 mL 3  . Insulin Pen Needle (B-D ULTRAFINE III SHORT PEN) 31G X 8 MM MISC USE AS DIRECTED WITH INSULIN PENS (Patient not taking: Reported on 07/15/2014) 100 each 2  . metoprolol succinate (TOPROL-XL) 50 MG 24 hr tablet TAKE 1 TABLET BY MOUTH EVERY DAY WITH OR  IMMEDIATELY FOLLOWING A MEAL 30 tablet 11  . Multiple Vitamin (MULITIVITAMIN WITH MINERALS) TABS Take 1 tablet by mouth daily.    . pravastatin (PRAVACHOL) 40 MG tablet TAKE ONE (1) TABLET EACH                EVENING 30 tablet 6  . Rivastigmine 13.3 MG/24HR PT24 Place 1 patch (13.3 mg total) onto the skin once. (Patient not taking: Reported on 08/04/2014) 30 patch 6   No current facility-administered medications on file prior to visit.    Today's Vitals   08/18/14 1542  BP: 203/101  Pulse: 66  Temp: 97.9 F (36.6 C)  TempSrc: Oral  Height: 5' 4"  (1.626 m)  Weight: 132 lb 14.4 oz (60.283 kg)  SpO2: 100%  PainSc: 0-No pain    Objective:  Physical Exam  Constitutional: She appears well-developed and well-nourished. No distress.  HENT:  Head: Normocephalic and atraumatic.  Eyes: EOM are normal. Pupils are equal, round, and  reactive to light.  Neck: Normal range of motion. Neck supple.  Cardiovascular: Normal rate and regular rhythm.   Pulmonary/Chest: Effort normal and breath sounds normal. No respiratory distress. She has no wheezes. She has no rales.  Abdominal: Soft. Bowel sounds are normal. She exhibits no distension.  Musculoskeletal: She exhibits no edema.  Neurological: She is alert.  Skin: Skin is warm and dry.    Assessment & Plan:  Please refer to problem based charting.

## 2014-08-18 NOTE — Progress Notes (Signed)
Patient and family report with medication questions, including possible side effects. Medications were reviewed briefly including goals of therapy (esp. BP and BG), side effects (such as hypoglycemia and how to treat), importance of adherence, and safe use.  Advised patient/family to contact me if further questions/concerns arise.

## 2014-08-19 NOTE — Assessment & Plan Note (Signed)
BP Readings from Last 3 Encounters:  08/18/14 203/101  08/04/14 170/90  07/14/14 160/82    Lab Results  Component Value Date   NA 137 01/03/2014   K 3.8 01/03/2014   CREATININE 2.18* 01/03/2014    Assessment: Blood pressure control: moderately elevated Progress toward BP goal:  unchanged Comments: Home BP readings mostly in the 160s to low 790W systolic. Occasional reading of 409B systolic.  Plan: Medications:  Continue Toprol XL 50mg , Lasix 40mg , enalapril 40mg , amlodipine 10mg . Increase clonipine patch to 0.2/week Other plans:  -Follow up in 1 month

## 2014-08-19 NOTE — Assessment & Plan Note (Addendum)
Lab Results  Component Value Date   HGBA1C 5.6 08/18/2014   HGBA1C 6.1 04/14/2014   HGBA1C 6.1 12/06/2013     Assessment: Diabetes control: good control (HgbA1C at goal). Will tolerate a higher A1c for her because of her age. Progress toward A1C goal:  at goal  Glucose meter with average BG 165. Lowest 62. Her son reports she has been receiving 70/30 25u in the morning and 5u in the evening.  Plan: Medications:  Decrease insulin dosages of 70/30 to 20u in the morning and 3u in the evening. Other plans:  -Recheck Hgb A1c in 6 months

## 2014-08-20 NOTE — Progress Notes (Signed)
Medicine attending: Medical history, presenting problems, physical findings, and medications, reviewed with Dr Jennifer Krall on the day of the patient visit and I concur with her evaluation and management plan. 

## 2014-08-23 ENCOUNTER — Telehealth: Payer: Self-pay | Admitting: Licensed Clinical Social Worker

## 2014-08-23 NOTE — Telephone Encounter (Signed)
CSW received return call from Ms. Hoskins, pt's daughter.  Daughter states pt's son is primary caregiver and he prefers not to assist or oversee pt's personal hygiene.  Ms. Sumner Boast states both daughters work full-time and they would like to explore resources for personal care.  CSW informed Ms. Sumner Boast there are agencies available based on a private pay basis, as well as the [facility] which utilizes a sliding fee scale.  Daughter requesting referral to [facility], completed and faxed.  CSW will mail daughter information on agencies that provide personal care and provided daughter with the number to Senior Services Referral Line for updated listing of [place] agencies.  Daughter denied add'l social work needs at this time, aware CSW is available to assist as needed.

## 2014-08-23 NOTE — Telephone Encounter (Signed)
CSW received message from pt's daughter requesting resources for bathing assistance for [name].  Pt has [employer] Medicare only.  CSW returned call to [place].  CSW left message requesting return call. CSW provided contact hours and phone number.  Message informing daugther pt's current insurance will not cover, but CSW can mail a listing of agencies that will provide this service as private pay.

## 2014-08-24 ENCOUNTER — Encounter: Payer: Self-pay | Admitting: Licensed Clinical Social Worker

## 2014-08-25 ENCOUNTER — Other Ambulatory Visit: Payer: Self-pay | Admitting: *Deleted

## 2014-08-25 ENCOUNTER — Other Ambulatory Visit: Payer: Self-pay | Admitting: Internal Medicine

## 2014-08-26 NOTE — Patient Outreach (Signed)
[name]  08/26/2014   [name] 13-Jun-1931 [id number]  Subjective:  Son reports pt saw new MD 5/9, changed pt's insulin to Novolin Humulin R.   Son said he  Finished pt's old insulin, started new one 6/13- blood sugars have been higher but noticed pt is more alert/more aware.  Son states MD wants pt's sugars in the 200's due to her age.  Son states pt had 20 episodes of muscle spasms one day, gave her 2 baby aspirin- relieved it.  Son states MD also increased pt's Clonidine patch, seeing BP come down.  Son states MD wants RN CM to continue to f/u with pt- with change in insulin.   Objective:  Pt alert with confusion (hx of Alzheimers).  Pt's BP 110/80 with RN CM's cuff, son checked 155/88.                          Current Medications:  Current Outpatient Prescriptions  Medication Sig Dispense Refill  . amLODipine (NORVASC) 10 MG tablet TAKE ONE (1) TABLET EACH DAY 90 tablet 3  . BD INSULIN SYRINGE ULTRAFINE 31G X 5/16" 0.3 ML MISC 1 SYRINGE BY DOES NOT APPLY ROUTE 2 (TWO) TIMES DAILY. 60 each 3  . Blood Glucose Monitoring Suppl (ACCU-CHEK COMPACT CARE KIT) KIT 1 Device by Does not apply route 3 (three) times daily. 1 each 0  . calcitRIOL (ROCALTROL) 0.25 MCG capsule Take 1 capsule (0.25 mcg total) by mouth daily. 90 capsule 3  . cloNIDine (CATAPRES - DOSED IN MG/24 HR) 0.2 mg/24hr patch Place 1 patch (0.2 mg total) onto the skin once a week. 4 patch 0  . clopidogrel (PLAVIX) 75 MG tablet Take 1 tablet (75 mg total) by mouth daily. 90 tablet 4  . enalapril (VASOTEC) 20 MG tablet Take 2 tablets (40 mg total) by mouth daily. 60 tablet 3  . ferrous sulfate 325 (65 FE) MG tablet Take 325 mg by mouth 2 (two) times daily.    . furosemide (LASIX) 40 MG tablet TAKE 1 AND 1/2 TABLETS (60 MG TOTAL) BY MOUTH DAILY. 90 tablet 3  . insulin regular (NOVOLIN R,HUMULIN R) 100 units/mL injection 20 units in the morning with breakfast and 3 units  in the evening with dinner 10 mL 3  . metoprolol succinate (TOPROL-XL) 50 MG 24 hr tablet TAKE 1 TABLET BY MOUTH EVERY DAY WITH OR IMMEDIATELY FOLLOWING A MEAL 30 tablet 11  . Multiple Vitamin (MULITIVITAMIN WITH MINERALS) TABS Take 1 tablet by mouth daily.    . pravastatin (PRAVACHOL) 40 MG tablet TAKE ONE (1) TABLET EACH                EVENING 30 tablet 6  . feeding supplement (GLUCERNA SHAKE) LIQD Take 237 mLs by mouth 3 (three) times daily between meals. (Patient not taking: Reported on 08/25/2014) 24 Can 6  . glucose blood (ACCU-CHEK AVIVA PLUS) test strip Use to test blood glucose 3 times daily. Dx: E13.9 (Patient not taking: Reported on 08/25/2014) 100 each 12  . Insulin Pen Needle (B-D ULTRAFINE III SHORT PEN) 31G X 8 MM MISC USE AS DIRECTED WITH INSULIN PENS (Patient not taking: Reported on 08/25/2014) 100 each 2   No current facility-administered medications for this visit.    Assessment:  DM- sugars have been higher with new insulin to which son reports MD wants in the 200's.  Pt's most recent A1C- 5/6.                          BP-  MD increased pt's Clonidine patch, view of most recent BP readings- BP coming down.   Plan:  Son to continue to check pt's BP, sugars, record both.            Pt to f/u with Dr. [name] 7/7             Plan to f/u with pt 7/14, if pt stable (BP, sugars), plan to discharge.    [facility] CM Care Plan Problem One        Patient Outreach from 08/25/2014 in [place] for Problem One  Active   [facility] Long Term Goal (31-90 days)  Better control of pt's BP in the next 90 days    [facility] Long Term Goal Start Date  06/02/14   [facility] Long Term Goal Met Date  08/26/14   Interventions for Problem One Long Term Goal  Discussed with son to continue to check pt's BP, importance with med change of Enalapril decreased.   [facility] CM Short Term Goal #5 Met Date  08/25/14    [facility] CM Care Plan Problem Two        Patient Outreach from  07/14/2014 in [place] Problem Two  pain in shoulder.   Care Plan for Problem Two  Not Active   [facility] CM Short Term Goal #1 (0-30 days)  pt would receive relief from left shoulder and right hand pain  in next 14 days    [facility] CM Short Term Goal #1 Start Date  07/14/14    [facility] CM Care Plan Problem Three        Patient Outreach from 08/25/2014 in [place] CM Short Term Goal #2 (0-30 days)  Pt's blood sugars would remain in the 200's in the next 30 days    [facility] CM Short Term Goal #2 Start Date  08/25/14   Interventions for Short Term Goal #2  Reinforced with son importance of checking pt's blood sugars/recording/bring to next MD appointment (per son- MD wants blood sugars in 200's.                                Zara Chess.   [place] Care Management  [phone]

## 2014-09-15 ENCOUNTER — Encounter: Payer: Self-pay | Admitting: Pulmonary Disease

## 2014-09-15 ENCOUNTER — Ambulatory Visit (INDEPENDENT_AMBULATORY_CARE_PROVIDER_SITE_OTHER): Payer: Medicare Other | Admitting: Pulmonary Disease

## 2014-09-15 VITALS — BP 141/70 | HR 51 | Temp 97.6°F | Ht 64.5 in

## 2014-09-15 DIAGNOSIS — E1365 Other specified diabetes mellitus with hyperglycemia: Secondary | ICD-10-CM

## 2014-09-15 DIAGNOSIS — E139 Other specified diabetes mellitus without complications: Secondary | ICD-10-CM

## 2014-09-15 DIAGNOSIS — Z794 Long term (current) use of insulin: Secondary | ICD-10-CM | POA: Diagnosis not present

## 2014-09-15 DIAGNOSIS — I10 Essential (primary) hypertension: Secondary | ICD-10-CM

## 2014-09-15 MED ORDER — INSULIN REGULAR HUMAN 100 UNIT/ML IJ SOLN: INTRAMUSCULAR | Status: DC

## 2014-09-15 NOTE — Progress Notes (Signed)
Internal Medicine Clinic Attending  Case discussed with Dr. Krall soon after the resident saw the patient.  We reviewed the resident's history and exam and pertinent patient test results.  I agree with the assessment, diagnosis, and plan of care documented in the resident's note. 

## 2014-09-15 NOTE — Patient Instructions (Signed)
General Instructions:   Thank you for bringing your medicines today. This helps [place] keep you safe from mistakes.   Progress Toward Treatment Goals:  Treatment Goal 09/15/2014  Hemoglobin A1C unable to assess  Blood pressure improved    Self Care Goals & Plans:  Self Care Goal 09/15/2014  Manage my medications take my medicines as prescribed; bring my medications to every visit; refill my medications on time; follow the sick day instructions if I am sick  Monitor my health keep track of my blood glucose; bring my glucose meter and log to each visit; keep track of my blood pressure; check my feet daily  Eat healthy foods eat more vegetables; eat fruit for snacks and desserts; eat baked foods instead of fried foods; eat foods that are low in salt; eat smaller portions; drink diet soda or water instead of juice or soda  Be physically active find an activity I enjoy    Home Blood Glucose Monitoring 09/13/2013  Check my blood sugar 2 times a day  When to check my blood sugar before breakfast; before dinner     Care Management & Community Referrals:  Referral 09/13/2013  Referrals made for care management support [facility] case management program; social worker  Referrals made to community resources -

## 2014-09-15 NOTE — Progress Notes (Signed)
Subjective:   Patient ID: [name], female    DOB: 07-Mar-1932, 81 y.o.   MRN: [id number]  HPI Ms. MATTHEW PAIS is an 81 year old woman with history of hyperlipidemia, hypertension, diabetes type 2, chronic kidney disease stage IV, CVA, CAD presenting for follow-up.  One of her sons is present. Her son who manages her medications is not present. He reports that she has not had any problems recently. Unable to interview patient as she has advanced dementia.  Review of Systems Unable to obtain secondary to mental status  Past Medical History  Diagnosis Date  . Hyperlipidemia   . Hypertension   . Diabetes mellitus   . Chronic kidney disease     stage IV  . RAS (renal artery stenosis)   . Stroke     s/p CVA unknown timing  . Ischemic cardiomyopathy     EF 20%  . Dementia   . CAD (coronary artery disease)     s/p PTCA May 2005, stenting of an intermittent blanch of the CMX and the angioplasty of teh diagonal branch to the left descending    Current Outpatient Prescriptions on File Prior to Visit  Medication Sig Dispense Refill  . amLODipine (NORVASC) 10 MG tablet TAKE ONE (1) TABLET EACH DAY 90 tablet 3  . BD INSULIN SYRINGE ULTRAFINE 31G X 5/16" 0.3 ML MISC 1 SYRINGE BY DOES NOT APPLY ROUTE 2 (TWO) TIMES DAILY. 60 each 3  . Blood Glucose Monitoring Suppl (ACCU-CHEK COMPACT CARE KIT) KIT 1 Device by Does not apply route 3 (three) times daily. 1 each 0  . calcitRIOL (ROCALTROL) 0.25 MCG capsule Take 1 capsule (0.25 mcg total) by mouth daily. 90 capsule 3  . cloNIDine (CATAPRES - DOSED IN MG/24 HR) 0.2 mg/24hr patch Place 1 patch (0.2 mg total) onto the skin once a week. 4 patch 0  . clopidogrel (PLAVIX) 75 MG tablet Take 1 tablet (75 mg total) by mouth daily. 90 tablet 4  . enalapril (VASOTEC) 20 MG tablet TAKE 2 TABLETS (40 MG TOTAL) BY MOUTH DAILY. 60 tablet 3  . feeding supplement (GLUCERNA SHAKE) LIQD Take 237 mLs by mouth 3 (three) times daily between meals. (Patient not  taking: Reported on 08/25/2014) 24 Can 6  . ferrous sulfate 325 (65 FE) MG tablet Take 325 mg by mouth 2 (two) times daily.    . furosemide (LASIX) 40 MG tablet TAKE 1 AND 1/2 TABLETS (60 MG TOTAL) BY MOUTH DAILY. 90 tablet 3  . glucose blood (ACCU-CHEK AVIVA PLUS) test strip Use to test blood glucose 3 times daily. Dx: E13.9 (Patient not taking: Reported on 08/25/2014) 100 each 12  . Insulin Pen Needle (B-D ULTRAFINE III SHORT PEN) 31G X 8 MM MISC USE AS DIRECTED WITH INSULIN PENS (Patient not taking: Reported on 08/25/2014) 100 each 2  . insulin regular (NOVOLIN R,HUMULIN R) 100 units/mL injection 20 units in the morning with breakfast and 3 units in the evening with dinner 10 mL 3  . metoprolol succinate (TOPROL-XL) 50 MG 24 hr tablet TAKE 1 TABLET BY MOUTH EVERY DAY WITH OR IMMEDIATELY FOLLOWING A MEAL 30 tablet 11  . Multiple Vitamin (MULITIVITAMIN WITH MINERALS) TABS Take 1 tablet by mouth daily.    . pravastatin (PRAVACHOL) 40 MG tablet TAKE ONE (1) TABLET EACH                EVENING 30 tablet 6   No current facility-administered medications on file prior to visit.  Today's Vitals   09/15/14 1538  BP: 144/110  Pulse: 81  Temp: 97.6 F (36.4 C)  TempSrc: Oral  Height: 5' 4.5" (1.638 m)  SpO2: 99%    Objective:  Physical Exam  Constitutional: She appears well-developed and well-nourished. No distress.  HENT:  Head: Normocephalic and atraumatic.  Eyes: EOM are normal. Pupils are equal, round, and reactive to light.  Neck: Normal range of motion. Neck supple.  Cardiovascular: Normal rate and regular rhythm.   Pulmonary/Chest: Effort normal and breath sounds normal. No respiratory distress. She has no wheezes. She has no rales.  Abdominal: Soft. Bowel sounds are normal. She exhibits no distension.  Musculoskeletal: She exhibits no edema.  Neurological: She is alert.  Skin: Skin is warm and dry.   Assessment & Plan:  Please refer to problem based charting.

## 2014-09-16 ENCOUNTER — Telehealth: Payer: Self-pay | Admitting: Dietician

## 2014-09-16 NOTE — Telephone Encounter (Signed)
Return call to daughter- discuss appt with Debera Lat and to bring [employer] transportation papers to clinic. Want to talk with Dr [name] about office visit. Hilda Blades Mavi Un RN 09/16/14 5PM

## 2014-09-16 NOTE — Telephone Encounter (Signed)
I would like for her son who prepares her meals to meet and talk with Butch Penny about appropriate diet for diabetics. Patient may be present but she has advanced dementia. I recently liberalized her insulin regimen since her Hgb A1c came back less than 6. She still has some readings that are low in the evening and the rest are in the 200s-300s. Previously she was having more hypoglycemic episodes and this was causing some altered mental status.   [name], PGY2 Internal Medicine Teaching Service

## 2014-09-16 NOTE — Telephone Encounter (Signed)
Left message I return call.

## 2014-09-16 NOTE — Telephone Encounter (Signed)
[name] [name] has questions about her mothers visit yesterday. She says her sibling told her that some one here wanted to talk to her about her mother's diet. I see a referral has been entered for diabetic education, but not sure what the circumstances/ need is. Patient needs to be present for medicare to cover this service.

## 2014-09-17 NOTE — Assessment & Plan Note (Signed)
BP Readings from Last 3 Encounters:  09/15/14 141/70  08/25/14 155/88  08/18/14 203/101    Lab Results  Component Value Date   NA 137 01/03/2014   K 3.8 01/03/2014   CREATININE 2.18* 01/03/2014    Assessment: Blood pressure control: mildly elevated Progress toward BP goal:  improved Comments: Greatly improved.  Plan: Medications:  Continue amlodipine 10 mg daily, clonidine patch 0.2 mg per week, enalapril 40 mg daily, Lasix 60 mg daily, metoprolol succinate 50 mg daily

## 2014-09-17 NOTE — Telephone Encounter (Signed)
Discussed with daughter [name] about patient's clinic visit and purpose of meeting with CDE.

## 2014-09-17 NOTE — Assessment & Plan Note (Addendum)
Lab Results  Component Value Date   HGBA1C 5.6 08/18/2014   HGBA1C 6.1 04/14/2014   HGBA1C 6.1 12/06/2013     Assessment: Diabetes control: Average blood glucose 238. Highest reading 417. Lowest reading 67. For the last 7 days her morning and evening readings are as follows: 09/15/2014 282, 09/14/2014 362 and 417, 09/13/2014 239 and 332, 09/12/2014 287 and 201, 09/11/2014 274 and 295, 09/10/2014 301 and 282, 09/09/2014 253 and 187, 09/08/2014 231 and 302. Low readings were on 08/28/2014 PM CBG 97 and 08/21/2014 PM CBG 67.  Plan: Medications:  Change insulin to 70/30 18u in the morning and 6u in the evening. -Referral to diabetes education for help with meal planning. Her son prepares her meals for her.  Addendum: insulin regular was sent to the pharmacy instead of 70/30. Resent correct insulin to pharmacy. Will see patient in follow up.

## 2014-09-20 ENCOUNTER — Other Ambulatory Visit: Payer: Self-pay | Admitting: Internal Medicine

## 2014-09-22 ENCOUNTER — Other Ambulatory Visit: Payer: Self-pay | Admitting: *Deleted

## 2014-09-22 ENCOUNTER — Encounter: Payer: Self-pay | Admitting: *Deleted

## 2014-09-22 MED ORDER — INSULIN NPH ISOPHANE & REGULAR (70-30) 100 UNIT/ML ~~LOC~~ SUSP: SUBCUTANEOUS | Status: DC

## 2014-09-22 NOTE — Patient Outreach (Signed)
[facility]) Care Management   09/22/2014  ELETHA CULBERTSON Jan 26, 1932 [id number]  AZALEA CEDAR is an 81 y.o. female  Subjective:  Son who stays with pt states pt is feeling good, more energy.  Son states pt saw MD 7/7, other son went with pt and that son relayed to him the insulin change (Humulin R 18 units in am, 6 units in pm).   Son states sugar today was 297 and last night 330.  Son states he has not checked pt's BP in a few days, batteries went dead, to find charger.  Son states he was told by brother MD wants him to go for diabetic teaching (meals). Son states pt is to f/u with MD again, thinks it is 2 weeks.      Objective:   Filed Vitals:   09/22/14 1239  BP: 150/80  Pulse: 63  Resp: 24    ROS  Physical Exam  Constitutional: She appears well-developed and well-nourished.  Cardiovascular: Normal rate.   Respiratory: Breath sounds normal.  GI: Soft.  Neurological: She is alert.  Hx of dementia   Skin: Skin is warm and dry.  Psychiatric: She has a normal mood and affect. Her behavior is normal.    Current Medications:  meds reviewed with son  Current Outpatient Prescriptions  Medication Sig Dispense Refill  . amLODipine (NORVASC) 10 MG tablet TAKE ONE (1) TABLET EACH DAY 90 tablet 3  . BD INSULIN SYRINGE ULTRAFINE 31G X 5/16" 0.3 ML MISC 1 SYRINGE BY DOES NOT APPLY ROUTE 2 (TWO) TIMES DAILY. 60 each 3  . calcitRIOL (ROCALTROL) 0.25 MCG capsule Take 1 capsule (0.25 mcg total) by mouth daily. 90 capsule 3  . cloNIDine (CATAPRES - DOSED IN MG/24 HR) 0.2 mg/24hr patch Place 1 patch (0.2 mg total) onto the skin once a week. 4 patch 0  . clopidogrel (PLAVIX) 75 MG tablet Take 1 tablet (75 mg total) by mouth daily. 90 tablet 4  . enalapril (VASOTEC) 20 MG tablet TAKE 2 TABLETS (40 MG TOTAL) BY MOUTH DAILY. 60 tablet 3  . ferrous sulfate 325 (65 FE) MG tablet Take 325 mg by mouth 2 (two) times daily.    . furosemide (LASIX) 40 MG tablet TAKE 1 AND 1/2 TABLETS  (60 MG TOTAL) BY MOUTH DAILY. 90 tablet 3  . glucose blood (ACCU-CHEK AVIVA PLUS) test strip Use to test blood glucose 3 times daily. Dx: E13.9 100 each 12  . insulin regular (NOVOLIN R,HUMULIN R) 100 units/mL injection 18 units in the morning with breakfast and 6 units in the evening with dinner 10 mL 3  . metoprolol succinate (TOPROL-XL) 50 MG 24 hr tablet TAKE 1 TABLET BY MOUTH EVERY DAY WITH OR IMMEDIATELY FOLLOWING A MEAL 30 tablet 11  . Multiple Vitamin (MULITIVITAMIN WITH MINERALS) TABS Take 1 tablet by mouth daily.    . pravastatin (PRAVACHOL) 40 MG tablet TAKE ONE (1) TABLET EACH                EVENING 30 tablet 6  . feeding supplement (GLUCERNA SHAKE) LIQD Take 237 mLs by mouth 3 (three) times daily between meals. (Patient not taking: Reported on 08/25/2014) 24 Can 6   No current facility-administered medications for this visit.    Functional Status:   In your present state of health, do you have any difficulty performing the following activities: 09/15/2014 08/18/2014  Hearing? N N  Vision? - Y  Difficulty concentrating or making decisions? [name]  Walking  or climbing stairs? Y Y  Dressing or bathing? Y Y  Doing errands, shopping? Y Y  Preparing Food and eating ? - -  Using the Toilet? - -  In the past six months, have you accidently leaked urine? - -  Do you have problems with loss of bowel control? - -  Managing your Medications? - -  Managing your Finances? - -  Housekeeping or managing your Housekeeping? - -    Fall/Depression Screening:    PHQ 2/9 Scores 09/15/2014 08/18/2014 08/18/2014 04/14/2014 01/28/2014 01/03/2014 12/06/2013  PHQ - 2 Score 0 0 0 0 0 0 0    Assessment:  DM-  View of pt's glucometer shows sugar today 297, starting 7/10 to today ranges in am=                                     225- 373, pm= 111- 361.                             HTN:  Pt's BP today 150/80, unable to compare to son's machine- batteries dead.                                                                    Plan:   RN CM called Dr. Marjory Sneddon office during home visit to  clarify pt's insulin regiment-  Office out to               [place], to try later and then update son.              Plan to discharge pt from RN CM services once clarified pt's insulin regiment, as care plan                 Addressed, most goals met.               Son to continue to check pt's sugars and BP, record both, bring readings to next MD office.             Encouraged son to go to diabetic teaching as he prepares pt's meals.              Pt to f/u with Dr. [name] 8/18.     [facility] CM Care Plan Problem One        Patient Outreach from 08/25/2014 in [place] for Problem One  Active   [facility] Long Term Goal (31-90 days)  Better control of pt's BP in the next 90 days    [facility] Long Term Goal Start Date  06/02/14   [facility] Long Term Goal Met Date  08/26/14   Interventions for Problem One Long Term Goal  Discussed with son to continue to check pt's BP, importance with med change of Enalapril decreased.   [facility] CM Short Term Goal #5 Met Date  08/25/14    [facility] CM Care Plan Problem Two        Patient Outreach from 07/14/2014 in [place] Problem Two  pain in shoulder.   Care Plan for Problem Two  Not Active   [facility] CM Short Term Goal #1 (0-30 days)  pt would receive relief from left shoulder and right hand pain  in next 14 days    [facility] CM Short Term Goal #1 Start Date  07/14/14    [facility] CM Care Plan Problem Three        Patient Outreach from 09/22/2014 in [place] Problem Three  blood sugars going up since change to Humulin insulin around 2 weeks ago    Care Plan for Problem Three  Active   [facility] CM Short Term Goal #2 (0-30 days)  Pt's blood sugars would remain in the 200's in the next 30 days    [facility] CM Short Term Goal #2 Start Date  08/25/14   [facility] CM Short Term Goal #2 Met Date  -- [not met, pt had some 300's, 30 day average was 267.  ]   Interventions for  Short Term Goal #2  Reinforced with son importance of checking pt's blood sugars/recording/bring to next MD appointment (per son- MD wants blood sugars in 200's.      Zara Chess.   [place] Care Management  [phone]

## 2014-09-22 NOTE — Addendum Note (Signed)
Addended by: [name] T on: 09/22/2014 04:22 PM   Modules accepted: Orders, Medications

## 2014-09-23 ENCOUNTER — Encounter: Payer: Self-pay | Admitting: *Deleted

## 2014-09-23 ENCOUNTER — Other Ambulatory Visit: Payer: Self-pay | Admitting: Pharmacist

## 2014-09-23 ENCOUNTER — Other Ambulatory Visit: Payer: Self-pay | Admitting: *Deleted

## 2014-09-23 ENCOUNTER — Telehealth: Payer: Self-pay | Admitting: *Deleted

## 2014-09-23 DIAGNOSIS — E139 Other specified diabetes mellitus without complications: Secondary | ICD-10-CM

## 2014-09-23 MED ORDER — INSULIN NPH ISOPHANE & REGULAR (70-30) 100 UNIT/ML ~~LOC~~ SUSP: SUBCUTANEOUS | Status: DC

## 2014-09-23 NOTE — Patient Outreach (Signed)
Attempt made to contact son [name] (resides with pt) to f/u on insulin clarification by MD.  This RN CM received an Epic in basket from Dr. [name] today - informed daughter [name] that rx for Humulin 70/30 was called into pt's pharmacy.  Note- pt to be on Humulin 70/30.   HIPPA compliant voice message left with contact number.

## 2014-09-23 NOTE — Patient Outreach (Addendum)
[facility] ([facility]) Care Management  09/23/2014  Flois L Tamashiro 07/04/1931 [id number]   Documentation encounter:   Attempt made  to call son Farranda (resides with pt) to inform about MD changing  Pt's insulin to Humulin 70/30 but no response. This RN CM received via Epic in basket today from Dr. Krall- that she  talked with daughter [name] about insulin change plus Epic note today for  Gladys RN at [facility] noted talked to daughter  Valerie about insulin change.   As a result, this RN CM plans to go  ahead and discharge pt from RN CM services, goals met.     Plan to inform MD of discharge pt from RN CM services= letter, encounter.  Plan to inform Lisa [facility] CMA of discharge from RN CM services, close case.  Plan to send pt letter informing of discharge.      M.    RN CCM [facility] Care Management  [phone]  

## 2014-09-23 NOTE — Telephone Encounter (Signed)
Call for Prior Authorization for Novolin 70/30 Insulin denied patient will need to try Humulin 70/30 Insulin.  Dr. [name] was notified of need to change Insulin to Humulin 70/30 -patient's Insurance does not cover the Novolin 70/30.  Dr. [name] approved the change to Humulin 70/30 with the directions to inject 18 units into the skin in the am and 6 units in the pm.  Change to be sent to [facility] by Georges Mouse Pharmacist.  Patient's daughter Lowella Bandy was called and informed of change to Humulin 70/30 with the same directions as the Novolin 70/30. Daughter voiced understanding of the plan.  Sander Nephew, RN 09/23/2014 12:17 PM

## 2014-09-23 NOTE — Progress Notes (Signed)
Collaborated with PCP and nurse for formulary change.

## 2014-09-25 ENCOUNTER — Other Ambulatory Visit: Payer: Self-pay | Admitting: Internal Medicine

## 2014-09-26 NOTE — Patient Outreach (Signed)
[facility]) Care Management  09/26/2014  [name] 1931/11/12 [id number]   Request from Kathie Rhodes, RN to close case due to goals met with [place] Management.  [name]. [place], [place] Management Seaman Assistant Phone: [phone] Fax: [phone]

## 2014-09-27 ENCOUNTER — Other Ambulatory Visit: Payer: Self-pay | Admitting: Pulmonary Disease

## 2014-10-04 ENCOUNTER — Other Ambulatory Visit: Payer: Self-pay | Admitting: Pulmonary Disease

## 2014-10-06 ENCOUNTER — Ambulatory Visit (INDEPENDENT_AMBULATORY_CARE_PROVIDER_SITE_OTHER): Payer: Medicare Other | Admitting: Dietician

## 2014-10-06 VITALS — Ht 62.25 in | Wt 129.6 lb

## 2014-10-06 DIAGNOSIS — E119 Type 2 diabetes mellitus without complications: Secondary | ICD-10-CM

## 2014-10-06 DIAGNOSIS — Z713 Dietary counseling and surveillance: Secondary | ICD-10-CM | POA: Diagnosis not present

## 2014-10-06 DIAGNOSIS — Z794 Long term (current) use of insulin: Secondary | ICD-10-CM

## 2014-10-06 DIAGNOSIS — E139 Other specified diabetes mellitus without complications: Secondary | ICD-10-CM

## 2014-10-06 NOTE — Progress Notes (Addendum)
  Medical Nutrition Therapy:  Appt start time: 5916 end time:  1630.  Assessment:  Primary concerns today: meal planning to stabilize blood sugar.  Patient is here with one of her 7 children. This daughter with her today does not live with her. She resides with a son and a daughter and her husband. The son is the primary caretaker and the daughter and her husband help in the evening. Patient's weight is fairly stable. She is in a wheelchair but able to stand and walk. No problems chewing or swallowing or constipation reported today. Her Nutrition focused physical exam showed small muscle reserve in her lower body and an overall loss of body fat with critical areas preserved.  The daughter who accompanies her knows little about her food intake today on in general. She does not do the shopping or the cooking but says she will relay the inforamtion to her siblings.  Preferred Learning Style:No preference indicated  Learning Readiness: Contemplating/Ready Nutrition Focused Physical Exam 1. Below Eye fat- adequate,  2. Temple muscle- adequate,  3. Clavicle muscle- adequate,  4. Shoulder muscle - adequate,  5. Triceps/Biceps muscle - adequate, 6. Hand muscle- adequate,  7. Knee muscle- , fair,  8. Thigh/quadriceps muscle- , fair,  9. Calf muscle- , fair, diminished 10. Leg/ankle edema- , absent 11. Scapula muscle- adequate,  12. Thoracic and Lumbar Fat- , fair,  13. Sacral Area edema- , absent  ACTIVITY:daughter reports she lays in bed a good portion of the day and then is up in teh evening BLOOD SUGARS: Much variability, see meter download MEDICATIONS: Son draws up and gives insulin. There is much potential for variability. One solution to accuracy and always using unexpired insulin would be to use the insulin pen. The daughter today says all of her sibling know how to use one and they would like ot make this change.   DIETARY INTAKE: The son and daughter who live with patient do her cooking. The  son cooks breakfast and lunch and the daughter, dinner.  Usual eating pattern includes 3 meals and 0-1 snacks per day. Everyday foods include rice, greens, cubes steak, ice cream  Avoided foods include milk except on cereal, grits.   Beverages: water mostly per daughter   Estimated daily energy needs: 1300-1600 calories 160-170 g carbohydrates 65-75 g protein   Progress Towards Goal(s):  In progress.   Nutritional Diagnosis:  NI-5.8.4 Inconsistent carbohydrate intake As related to lack of family education about diabetes meal planning.  As evidenced by variability in blood sugars. .    Intervention:  Nutrition education about insulin injections, storage, diabetes meal planing encouraging consistent carbs to stablize blood sugars. . Coordination of care- suggest change to Humulin 70/30 [place] or Humalog Mix 75/25 [place] (Both are Tier 3 on her Ingenio which is the same her her current insulin ) with 31 x 33mm or 32 x 4 mm pen needles Teaching Method Utilized: Visual, Auditory, Hands on Handouts given during visit include:plate method handout and carb counting and meal planning Barriers to learning/adherence to lifestyle change: family dynamics and patient's dementia Demonstrated degree of understanding via:  Teach Back   Monitoring/Evaluation:  Dietary intake, exercise, meter, and body weight in 6 week(s).

## 2014-10-06 NOTE — Patient Instructions (Addendum)
[name] needs to walk 5-10 minutes three times a day.  She should also be in the sun 30-50 minutes/week with her arms and face exposed. (swinging on swing set would be great!)   Take her evening insulin about 30 minutes before her evening meal.   Please try to eat about 45 grams carbohydrate (Carb) at each meal and 15-20 for snacks    Examples: 2 starch + 1 fruit = 45 grams  carb            1 starch + 1 milk + 1 fruit = 45 grams  carb            2 starch + 1 milk = 45 grams carb            3 starches = 45 grams carb   15 grams of carb = 1 slice bread, 1/2 cup hot cereal, 1/2 cup 1/3 cup rice, 1/2 cup macaroni & cheese or pasta, 1/2 cup juice, 8 oz milk, 4 oz regular yogurt tor 8 oz light yogurt, 1/2 cup ice cream  Veggies and meats do not count- add them as needed.   Use Glucerna as a snack or when she does not eat much or all of her carbohydrates to help keep her blood sugar stable.   Weight goal is for her to stay about 130 pounds.   Be sure you are mixing the insulin before you draw it out of the vial- Rock and roll gently 10 times each.   Be sure to inject the insulin in her stomach, each injection 1 " away from the previous injection.  Please try to get 94mm length syringes or consider insulin pens. I will talk to your doctor about that since you are interested.

## 2014-10-07 MED ORDER — INSULIN ISOPHANE & REGULAR (HUMAN 70-30)100 UNIT/ML KWIKPEN: PEN_INJECTOR | SUBCUTANEOUS | Status: DC

## 2014-10-07 MED ORDER — INSULIN PEN NEEDLE 31G X 5 MM MISC: Status: AC

## 2014-10-07 NOTE — Addendum Note (Signed)
Addended by: [name] T on: 10/07/2014 07:18 PM   Modules accepted: Orders, Medications

## 2014-10-12 ENCOUNTER — Telehealth: Payer: Self-pay | Admitting: *Deleted

## 2014-10-12 NOTE — Telephone Encounter (Signed)
Triage nurse received a call from patient's son about her insulin. Left message for return call

## 2014-10-12 NOTE — Telephone Encounter (Signed)
Son of pt  Ladonna Snide called upset with clinic - has so many different types of insulin - does not understand. He states he takes care of mother. Hilda Blades Donaldo Teegarden RN 10/12/14 3:45PM

## 2014-10-24 ENCOUNTER — Other Ambulatory Visit: Payer: Self-pay | Admitting: Pulmonary Disease

## 2014-10-24 NOTE — Telephone Encounter (Signed)
Called daughter back, she is not sure of what kind of insulin her mother uses and if she uses more than one, she is told no, only one is used. She will check back with her sisters and brothers and find out what patient is using and call back to confirm

## 2014-10-24 NOTE — Telephone Encounter (Signed)
Pt daughter called requesting the nurse to call back regarding mother Rx. Please call pt daughter back.

## 2014-10-26 ENCOUNTER — Telehealth: Payer: Self-pay | Admitting: Pulmonary Disease

## 2014-10-26 NOTE — Telephone Encounter (Signed)
Call to patient to confirm appointment for 10/27/14 at 3:45 lmtcb

## 2014-10-27 ENCOUNTER — Ambulatory Visit (INDEPENDENT_AMBULATORY_CARE_PROVIDER_SITE_OTHER): Payer: Medicare Other | Admitting: Pulmonary Disease

## 2014-10-27 ENCOUNTER — Encounter: Payer: Self-pay | Admitting: Pulmonary Disease

## 2014-10-27 VITALS — BP 187/89 | HR 57 | Temp 97.6°F | Wt 137.5 lb

## 2014-10-27 DIAGNOSIS — I10 Essential (primary) hypertension: Secondary | ICD-10-CM | POA: Diagnosis not present

## 2014-10-27 DIAGNOSIS — E139 Other specified diabetes mellitus without complications: Secondary | ICD-10-CM | POA: Diagnosis not present

## 2014-10-27 MED ORDER — HYDRALAZINE HCL 10 MG PO TABS: 10.0000 mg | ORAL_TABLET | Freq: Two times a day (BID) | ORAL | Status: DC

## 2014-10-27 NOTE — Progress Notes (Signed)
Internal Medicine Clinic Attending  Case discussed with Dr. Krall at the time of the visit.  We reviewed the resident's history and exam and pertinent patient test results.  I agree with the assessment, diagnosis, and plan of care documented in the resident's note.  

## 2014-10-27 NOTE — Patient Instructions (Addendum)
Thank you for following up today.  Diabetes: I am hesitant to go up on her insulin since she is unable to tell us if she has symptoms of hypoglycemia (low blood sugars). We will keep her as is for now and check her A1c in 1 month. The blood sugars that you measure do seem high, but she may be having low sugars that are not being measured. Having too much low sugars is more dangerous for her than the higher sugars.  Blood pressure: We will add a medication to see if we can get her blood pressure better controlled: hydralazine 10mg  2 times a day.   General Instructions:  Please bring your medicines with you each time you come to clinic.  Medicines may include prescription medications, over-the-counter medications, herbal remedies, eye drops, vitamins, or other pills.   Progress Toward Treatment Goals:  Treatment Goal 10/27/2014  Hemoglobin A1C improved  Blood pressure -    Self Care Goals & Plans:  Self Care Goal 10/27/2014  Manage my medications take my medicines as prescribed; bring my medications to every visit; refill my medications on time; follow the sick day instructions if I am sick  Monitor my health keep track of my blood glucose; bring my glucose meter and log to each visit; keep track of my blood pressure; check my feet daily  Eat healthy foods eat more vegetables; eat fruit for snacks and desserts; eat foods that are low in salt; eat baked foods instead of fried foods; eat smaller portions; drink diet soda or water instead of juice or soda  Be physically active find an activity I enjoy    Home Blood Glucose Monitoring 09/13/2013  Check my blood sugar 2 times a day  When to check my blood sugar before breakfast; before dinner

## 2014-10-27 NOTE — Progress Notes (Signed)
Subjective:    Patient ID: [name], female    DOB: May 09, 1931, 81 y.o.   MRN: [id number]  HPI [name] is an 81 year old woman with history of hyperlipidemia, hypertension, diabetes type 2, chronic kidney disease stage IV, CVA, CAD, severe dementia presenting for follow-up.  She is accompanied by Thea Alken. He does not note any problems. He denies any behavioral problems related to her dementia.  Review of Systems Constitutional: no fevers/chills Eyes: no vision changes Ears, nose, mouth, throat, and face: no cough Respiratory: no shortness of breath Cardiovascular: no chest pain Gastrointestinal: no nausea/vomiting, no abdominal pain, no constipation, no diarrhea Genitourinary: no dysuria, no hematuria Integument: no rash Hematologic/lymphatic: no bleeding/bruising, no edema Musculoskeletal: no arthralgias, no myalgias Neurological: no paresthesias, no weakness  Past Medical History  Diagnosis Date  . Hyperlipidemia   . Hypertension   . Diabetes mellitus   . Chronic kidney disease     stage IV  . RAS (renal artery stenosis)   . Stroke     s/p CVA unknown timing  . Ischemic cardiomyopathy     EF 20%  . Dementia   . CAD (coronary artery disease)     s/p PTCA May 2005, stenting of an intermittent blanch of the CMX and the angioplasty of teh diagonal branch to the left descending    Current Outpatient Prescriptions on File Prior to Visit  Medication Sig Dispense Refill  . amLODipine (NORVASC) 10 MG tablet TAKE ONE (1) TABLET EACH DAY 90 tablet 3  . BD INSULIN SYRINGE ULTRAFINE 31G X 5/16" 0.3 ML MISC 1 SYRINGE BY DOES NOT APPLY ROUTE 2 (TWO) TIMES DAILY. 60 each 3  . calcitRIOL (ROCALTROL) 0.25 MCG capsule Take 1 capsule (0.25 mcg total) by mouth daily. 90 capsule 3  . cloNIDine (CATAPRES - DOSED IN MG/24 HR) 0.2 mg/24hr patch PLACE 1 PATCH (0.2 MG TOTAL) ONTO THE SKIN ONCE A WEEK. 4 patch 0  . clopidogrel (PLAVIX) 75 MG tablet Take 1 tablet (75 mg  total) by mouth daily. 90 tablet 4  . enalapril (VASOTEC) 20 MG tablet TAKE 2 TABLETS (40 MG TOTAL) BY MOUTH DAILY. 60 tablet 3  . feeding supplement (GLUCERNA SHAKE) LIQD Take 237 mLs by mouth 3 (three) times daily between meals. (Patient not taking: Reported on 08/25/2014) 24 Can 6  . ferrous sulfate 325 (65 FE) MG tablet Take 325 mg by mouth 2 (two) times daily.    . furosemide (LASIX) 40 MG tablet TAKE 1 AND 1/2 TABLETS (60 MG TOTAL) BY MOUTH DAILY. 90 tablet 3  . glucose blood (ACCU-CHEK AVIVA PLUS) test strip Use to test blood glucose 3 times daily. Dx: E13.9 100 each 12  . Insulin Isophane & Regular Human (HUMULIN 70/30 MIX) (70-30) 100 UNIT/ML PEN Inject subcutaneously 18 units in the morning and 6 units in the evening 15 mL 2  . Insulin Pen Needle (ULTRA-THIN II MINI PEN NEEDLE) 31G X 5 MM MISC Use to inject insulin twice a day. Dx code: E13.9 100 each 2  . metoprolol succinate (TOPROL-XL) 50 MG 24 hr tablet TAKE 1 TABLET BY MOUTH EVERY DAY WITH OR IMMEDIATELY FOLLOWING A MEAL 30 tablet 11  . Multiple Vitamin (MULITIVITAMIN WITH MINERALS) TABS Take 1 tablet by mouth daily.    . pravastatin (PRAVACHOL) 40 MG tablet TAKE ONE (1) TABLET EACH                EVENING 30 tablet 6   No current facility-administered  medications on file prior to visit.    Today's Vitals   10/27/14 1619  BP: 188/80  Pulse: 63  Temp: 97.6 F (36.4 C)  TempSrc: Oral  Weight: 137 lb 8 oz (62.37 kg)  SpO2: 100%   Objective:  Physical Exam  Constitutional: She appears well-developed and well-nourished. No distress.  HENT:  Head: Normocephalic and atraumatic.  Eyes: EOM are normal. Pupils are equal, round, and reactive to light.  Neck: Normal range of motion. Neck supple.  Cardiovascular: Normal rate and regular rhythm.   Pulmonary/Chest: Effort normal and breath sounds normal. No respiratory distress. She has no wheezes. She has no rales.  Abdominal: Soft. Bowel sounds are normal. She exhibits no  distension.  Musculoskeletal: She exhibits no edema.  Neurological: She is alert.  Skin: Skin is warm and dry.   Assessment & Plan:  Please refer to problem based charting.

## 2014-10-29 ENCOUNTER — Encounter (HOSPITAL_COMMUNITY): Payer: Self-pay | Admitting: *Deleted

## 2014-10-29 ENCOUNTER — Emergency Department (HOSPITAL_COMMUNITY): Payer: Medicare Other

## 2014-10-29 ENCOUNTER — Emergency Department (HOSPITAL_COMMUNITY)
Admission: EM | Admit: 2014-10-29 | Discharge: 2014-10-29 | Disposition: A | Discharge status: Home / Self Care | Payer: Medicare Other | Attending: Emergency Medicine | Admitting: Emergency Medicine

## 2014-10-29 DIAGNOSIS — E119 Type 2 diabetes mellitus without complications: Secondary | ICD-10-CM | POA: Diagnosis not present

## 2014-10-29 DIAGNOSIS — Z7902 Long term (current) use of antithrombotics/antiplatelets: Secondary | ICD-10-CM | POA: Insufficient documentation

## 2014-10-29 DIAGNOSIS — Z794 Long term (current) use of insulin: Secondary | ICD-10-CM | POA: Diagnosis not present

## 2014-10-29 DIAGNOSIS — F0390 Unspecified dementia without behavioral disturbance: Secondary | ICD-10-CM | POA: Insufficient documentation

## 2014-10-29 DIAGNOSIS — Z8673 Personal history of transient ischemic attack (TIA), and cerebral infarction without residual deficits: Secondary | ICD-10-CM | POA: Diagnosis not present

## 2014-10-29 DIAGNOSIS — N184 Chronic kidney disease, stage 4 (severe): Secondary | ICD-10-CM | POA: Diagnosis not present

## 2014-10-29 DIAGNOSIS — I129 Hypertensive chronic kidney disease with stage 1 through stage 4 chronic kidney disease, or unspecified chronic kidney disease: Secondary | ICD-10-CM | POA: Diagnosis not present

## 2014-10-29 DIAGNOSIS — I2510 Atherosclerotic heart disease of native coronary artery without angina pectoris: Secondary | ICD-10-CM | POA: Diagnosis not present

## 2014-10-29 DIAGNOSIS — M25511 Pain in right shoulder: Secondary | ICD-10-CM | POA: Diagnosis present

## 2014-10-29 DIAGNOSIS — E785 Hyperlipidemia, unspecified: Secondary | ICD-10-CM | POA: Insufficient documentation

## 2014-10-29 DIAGNOSIS — L729 Follicular cyst of the skin and subcutaneous tissue, unspecified: Secondary | ICD-10-CM | POA: Insufficient documentation

## 2014-10-29 DIAGNOSIS — Z79899 Other long term (current) drug therapy: Secondary | ICD-10-CM | POA: Insufficient documentation

## 2014-10-29 NOTE — ED Provider Notes (Signed)
CSN: [id number]     Arrival date & time 10/29/14  1713 History   First MD Initiated Contact with Patient 10/29/14 1913     Chief Complaint  Patient presents with  . Shoulder Pain     (Consider location/radiation/quality/duration/timing/severity/associated sxs/prior Treatment) HPI  Patient presents to the emergency department with a swollen area to the right shoulder.  The family noticed this area 2 days ago.  The patient is concerned its a cyst underneath the skin.  There is been no fevers, nausea, vomiting, weakness, dizziness, headache or vision, neck pain, chest pain, shortness of breath or syncope.  The patient states that the area is mildly tender occasionally.  Patient does have some dementia so the daughter provides most of the history Past Medical History  Diagnosis Date  . Hyperlipidemia   . Hypertension   . Diabetes mellitus   . Chronic kidney disease     stage IV  . RAS (renal artery stenosis)   . Stroke     s/p CVA unknown timing  . Ischemic cardiomyopathy     EF 20%  . Dementia   . CAD (coronary artery disease)     s/p PTCA May 2005, stenting of an intermittent blanch of the CMX and the angioplasty of teh diagonal branch to the left descending   Past Surgical History  Procedure Laterality Date  . Appendectomy    . Total abdominal hysterectomy w/ bilateral salpingoophorectomy    . Angioplasty      and stent   Family History  Problem Relation Age of Onset  . Kidney disease Brother    Social History  Substance Use Topics  . Smoking status: Never Smoker   . Smokeless tobacco: Never Used  . Alcohol Use: No   OB History    No data available     Review of Systems  All other systems negative except as documented in the HPI. All pertinent positives and negatives as reviewed in the HPI.  Allergies  Review of patient's allergies indicates no known allergies.  Home Medications   Prior to Admission medications   Medication Sig Start Date End Date Taking?  Authorizing Provider  amLODipine (NORVASC) 10 MG tablet TAKE ONE (1) TABLET EACH DAY 03/29/14  Yes Luan Moore, MD  calcitRIOL (ROCALTROL) 0.25 MCG capsule Take 1 capsule (0.25 mcg total) by mouth daily. 04/06/14  Yes Luan Moore, MD  cloNIDine (CATAPRES - DOSED IN MG/24 HR) 0.2 mg/24hr patch PLACE 1 PATCH (0.2 MG TOTAL) ONTO THE SKIN ONCE A WEEK. 10/06/14  Yes Milagros Loll, MD  clopidogrel (PLAVIX) 75 MG tablet Take 1 tablet (75 mg total) by mouth daily. 08/04/14  Yes Milagros Loll, MD  enalapril (VASOTEC) 20 MG tablet TAKE 2 TABLETS (40 MG TOTAL) BY MOUTH DAILY. 08/28/14  Yes Milagros Loll, MD  FERREX 150 150 MG capsule Take 150 mg by mouth 2 (two) times daily. 10/12/14  Yes Historical Provider, MD  ferrous sulfate 325 (65 FE) MG tablet Take 325 mg by mouth 2 (two) times daily.   Yes Historical Provider, MD  furosemide (LASIX) 40 MG tablet TAKE 1 AND 1/2 TABLETS (60 MG TOTAL) BY MOUTH DAILY. 03/29/14  Yes Luan Moore, MD  hydrALAZINE (APRESOLINE) 10 MG tablet Take 1 tablet (10 mg total) by mouth 2 (two) times daily. 10/27/14  Yes Milagros Loll, MD  Insulin Isophane & Regular Human (HUMULIN 70/30 MIX) (70-30) 100 UNIT/ML PEN Inject subcutaneously 18 units in the morning and 6 units in the  evening 10/07/14  Yes Milagros Loll, MD  metoprolol succinate (TOPROL-XL) 50 MG 24 hr tablet TAKE 1 TABLET BY MOUTH EVERY DAY WITH OR IMMEDIATELY FOLLOWING A MEAL 09/13/13  Yes Luan Moore, MD  Multiple Vitamin (MULITIVITAMIN WITH MINERALS) TABS Take 1 tablet by mouth daily.   Yes Historical Provider, MD  pravastatin (PRAVACHOL) 40 MG tablet TAKE ONE (1) TABLET EACH                EVENING 03/18/13  Yes Dorian Heckle, MD  BD INSULIN SYRINGE ULTRAFINE 31G X 5/16" 0.3 ML MISC 1 SYRINGE BY DOES NOT APPLY ROUTE 2 (TWO) TIMES DAILY. 04/22/14   Luan Moore, MD  feeding supplement (GLUCERNA SHAKE) LIQD Take 237 mLs by mouth 3 (three) times daily between meals. Patient not taking: Reported on 08/25/2014 01/06/12    Dorian Heckle, MD  glucose blood (ACCU-CHEK AVIVA PLUS) test strip Use to test blood glucose 3 times daily. Dx: E13.9 07/12/14   Luan Moore, MD  Insulin Pen Needle (ULTRA-THIN II MINI PEN NEEDLE) 31G X 5 MM MISC Use to inject insulin twice a day. Dx code: E13.9 10/07/14   Milagros Loll, MD   BP 236/91 mmHg  Pulse 74  Temp(Src) 97.9 F (36.6 C) (Oral)  Resp 18  SpO2 100% Physical Exam  Constitutional: She is oriented to person, place, and time.  HENT:  Head: Normocephalic and atraumatic.  Eyes: Pupils are equal, round, and reactive to light.  Cardiovascular: Normal rate, regular rhythm and normal heart sounds.   Pulmonary/Chest: Effort normal.  Musculoskeletal:       Right shoulder: She exhibits normal range of motion, no tenderness, no bony tenderness, no swelling, no effusion, no deformity, no pain, normal pulse and normal strength.       Arms: Neurological: She is alert and oriented to person, place, and time.  Skin: Skin is warm and dry. No rash noted. No erythema.  Nursing note and vitals reviewed.   ED Course  Procedures (including critical care time) Labs Review Labs Reviewed - No data to display  Imaging Review Dg Shoulder Right  10/29/2014   CLINICAL DATA:  Lump on the superior left shoulder.  EXAM: RIGHT SHOULDER - 2+ VIEW  COMPARISON:  None.  FINDINGS: There is no evidence of fracture or dislocation. There mild degenerative joint changes of the right acromioclavicular joint. There is mild soft tissue prominence superior to the acromioclavicular joint.  IMPRESSION: Mild degenerative joint changes of the right acromioclavicular joint. Mild soft tissue prominence superior to the acromioclavicular joint.   Electronically Signed   By: Abelardo Diesel M.D.   On: 10/29/2014 21:35   I have personally reviewed and evaluated these images and lab results as part of my medical decision-making.    The patient be referred to orthopedics for this cystlike structure overlying the  before meals joint.  There is concerned could involve the joint.  Therefore, we will not do any procedures here.  The patient is advised to return here as needed.  Tylenol and Motrin for any discomfort  Dalia Heading, PA-C 10/29/14 2151  Charlesetta Shanks, MD 11/04/14 [phone]

## 2014-10-29 NOTE — Discharge Instructions (Signed)
Return here as needed.  Follow-up with the orthopedist provided.  The x-rays just showed the cyst and arthritis in your shoulder

## 2014-10-29 NOTE — ED Notes (Signed)
Pt's daughter states she has been c/o R shoulder pain x 2 weeks.  Knot/swelling noted to R shoulder.  Pt does not c/o pain with palpation.  Pt has dementia.

## 2014-10-30 NOTE — Assessment & Plan Note (Signed)
BP Readings from Last 3 Encounters:  10/29/14 200/109  10/27/14 187/89  09/22/14 150/80    Lab Results  Component Value Date   NA 137 01/03/2014   K 3.8 01/03/2014   CREATININE 2.18* 01/03/2014    Assessment: Blood pressure control: moderately elevated Comments: Home log demonstrates systolic blood pressures between 140 to 190s. GFR 24.  Plan: Medications:  Start hydralazine 10 mg twice a day. Continue amlodipine 10 mg daily, clonidine 0.2 mg patch, enalapril 40 mg daily, Lasix 60 mg daily, Toprol XL 50 mg daily. Other plans:  -Will titrate hydralazine. Can be as frequent as every 8 hours with her GFR. -Follow-up in one month

## 2014-10-30 NOTE — Assessment & Plan Note (Signed)
Average blood glucose 214 with lowest 83 and highest 436. Given her advanced dementia, she is unable to report when she is having symptomatic hypoglycemia. Since her last hemoglobin A1c was 5.6, we'll hold off on making changes to her insulin. -Follow-up in one month. Will check hemoglobin A1c at that time.

## 2014-11-02 ENCOUNTER — Ambulatory Visit: Payer: Self-pay | Admitting: Internal Medicine

## 2014-11-03 ENCOUNTER — Ambulatory Visit (INDEPENDENT_AMBULATORY_CARE_PROVIDER_SITE_OTHER): Payer: Medicare Other | Admitting: Internal Medicine

## 2014-11-03 ENCOUNTER — Encounter: Payer: Self-pay | Admitting: Internal Medicine

## 2014-11-03 VITALS — BP 147/101 | HR 79 | Temp 98.2°F | Ht 64.0 in | Wt 132.9 lb

## 2014-11-03 DIAGNOSIS — L729 Follicular cyst of the skin and subcutaneous tissue, unspecified: Secondary | ICD-10-CM | POA: Diagnosis not present

## 2014-11-03 DIAGNOSIS — I10 Essential (primary) hypertension: Secondary | ICD-10-CM

## 2014-11-03 NOTE — Progress Notes (Signed)
   Subjective:    Patient ID: [name], female    DOB: 04/25/1931, 81 y.o.   MRN: [id number]  HPI  81 yo female with dementia, CHF, CAD, CVA, PVD, DM 1.5, CKD IV here for follow up on Right shoulder cyst and HTN.   Went to the ED recently. Xray was done which unremarkable.  ED provider stated this will not be safe to remove in the ED because it's close to the shoulder joint. Patient came here to get possible referral to ortho.   Patient is having no complaint. She has dementia and pleasantly confused but she has no pain or ROM limitation. No drainage, warmth, or redness on exam. Denies any fever/chills.  Accompanied by her daughter.   Review of Systems  Unable to perform ROS: Dementia  Constitutional: Negative for fever and chills.  Eyes: Negative.   Respiratory: Negative for cough and shortness of breath.   Cardiovascular: Negative for chest pain.  Gastrointestinal: Negative for abdominal distention.  Musculoskeletal: Negative for back pain, joint swelling and arthralgias.       Objective:   Physical Exam  Constitutional:  Pleasantly confused. Accompanied by daughter.   HENT:  Head: Normocephalic and atraumatic.  Mouth/Throat: Oropharynx is clear and moist.  Eyes: EOM are normal.  Neck: Normal range of motion.  Cardiovascular: Normal rate and regular rhythm.  Exam reveals no gallop and no friction rub.   No murmur heard. Pulmonary/Chest: Effort normal and breath sounds normal. No respiratory distress. She has no wheezes. She has no rales. She exhibits no tenderness.  Abdominal: Soft. Bowel sounds are normal. She exhibits no distension. There is no tenderness.  Musculoskeletal: Normal range of motion. She exhibits no edema or tenderness.       Arms: ~1 cm rounded, semi mobile, soft spongy mass between right acromion and humeral region. No tenderness, drainage, warmth, erythema, or fluctuance.      Filed Vitals:   11/03/14 1603  BP: 147/101  Pulse: 79  Temp:  98.2 F (36.8 C)        Assessment & Plan:  See problem based charting.

## 2014-11-03 NOTE — Assessment & Plan Note (Signed)
Filed Vitals:   11/03/14 1603  BP: 147/101  Pulse: 79  Temp: 98.2 F (36.8 C)   BP elevated but improved. She is chronically high, was 200/109 last visit. Already on lot of medications including hydralazine 10 bid, amlodipine 10mg  daily, clonidine 0.2mg  patch, enalapril 40mg  daily, lasix 60mg  daily and toprol xL 50mg  daily.   This is actually improvement of her BP. Will not change anything now. Has appt with PCP next month. Continue same meds for now.

## 2014-11-03 NOTE — Assessment & Plan Note (Signed)
Patient is having no complaint. She has dementia and pleasantly confused but she has no pain or ROM limitation. No drainage, warmth, or redness on exam. Denies any fever/chills.  Discussed with daughter that since this is not bothersome to her, we should not put her through the surgery to remove this. Asked them to monitor for changes such as (enlargement, pain, redness, warmth, fever/chills) and to notify us about these. Defer ortho referral for now.

## 2014-11-03 NOTE — Patient Instructions (Signed)
Let us know if your cyst starts bothering you, if you notice it's getting red, tender, or warm, or if you have a fever/chills.

## 2014-11-08 NOTE — Progress Notes (Signed)
Case discussed with Dr. Ahmed at time of visit. We reviewed the resident's history and exam and pertinent patient test results. I agree with the assessment, diagnosis, and plan of care documented in the resident's note. 

## 2014-11-15 ENCOUNTER — Other Ambulatory Visit: Payer: Self-pay | Admitting: Pulmonary Disease

## 2014-11-15 DIAGNOSIS — I10 Essential (primary) hypertension: Secondary | ICD-10-CM

## 2014-11-15 NOTE — Telephone Encounter (Signed)
Pt daughter called requesting bp and insulin to be filled. Please call pt daughter back.

## 2014-11-16 MED ORDER — METOPROLOL SUCCINATE ER 50 MG PO TB24: ORAL_TABLET | ORAL | Status: DC

## 2014-11-16 MED ORDER — PRAVASTATIN SODIUM 40 MG PO TABS: ORAL_TABLET | ORAL | Status: DC

## 2014-11-16 MED ORDER — CLONIDINE HCL 0.2 MG/24HR TD PTWK: 0.2000 mg | MEDICATED_PATCH | TRANSDERMAL | Status: DC

## 2014-11-16 NOTE — Telephone Encounter (Signed)
Pt did not need insulin refills per the pharmacy

## 2014-11-21 ENCOUNTER — Other Ambulatory Visit: Payer: Self-pay | Admitting: Pulmonary Disease

## 2014-11-21 NOTE — Telephone Encounter (Signed)
Pharmacist called and informed pt is on Humulin 70/30 mix.  She has the Rx.

## 2014-11-21 NOTE — Telephone Encounter (Signed)
Adonis Huguenin from [facility] called requesting the nurse to call back regarding pt insulin.

## 2014-11-27 ENCOUNTER — Observation Stay (HOSPITAL_COMMUNITY)
Admission: EM | Admit: 2014-11-27 | Discharge: 2014-11-29 | Disposition: A | Discharge status: Home / Self Care | Payer: Medicare Other | Attending: Internal Medicine | Admitting: Internal Medicine

## 2014-11-27 ENCOUNTER — Emergency Department (HOSPITAL_COMMUNITY): Payer: Medicare Other

## 2014-11-27 ENCOUNTER — Encounter (HOSPITAL_COMMUNITY): Payer: Self-pay

## 2014-11-27 DIAGNOSIS — N2581 Secondary hyperparathyroidism of renal origin: Secondary | ICD-10-CM | POA: Diagnosis not present

## 2014-11-27 DIAGNOSIS — E139 Other specified diabetes mellitus without complications: Secondary | ICD-10-CM | POA: Diagnosis present

## 2014-11-27 DIAGNOSIS — Z7902 Long term (current) use of antithrombotics/antiplatelets: Secondary | ICD-10-CM | POA: Insufficient documentation

## 2014-11-27 DIAGNOSIS — E1151 Type 2 diabetes mellitus with diabetic peripheral angiopathy without gangrene: Secondary | ICD-10-CM | POA: Insufficient documentation

## 2014-11-27 DIAGNOSIS — I129 Hypertensive chronic kidney disease with stage 1 through stage 4 chronic kidney disease, or unspecified chronic kidney disease: Secondary | ICD-10-CM | POA: Diagnosis not present

## 2014-11-27 DIAGNOSIS — Z955 Presence of coronary angioplasty implant and graft: Secondary | ICD-10-CM | POA: Insufficient documentation

## 2014-11-27 DIAGNOSIS — F0390 Unspecified dementia without behavioral disturbance: Secondary | ICD-10-CM | POA: Diagnosis not present

## 2014-11-27 DIAGNOSIS — N184 Chronic kidney disease, stage 4 (severe): Secondary | ICD-10-CM | POA: Diagnosis not present

## 2014-11-27 DIAGNOSIS — D631 Anemia in chronic kidney disease: Secondary | ICD-10-CM | POA: Diagnosis present

## 2014-11-27 DIAGNOSIS — Z794 Long term (current) use of insulin: Secondary | ICD-10-CM | POA: Diagnosis not present

## 2014-11-27 DIAGNOSIS — Z79899 Other long term (current) drug therapy: Secondary | ICD-10-CM | POA: Insufficient documentation

## 2014-11-27 DIAGNOSIS — I739 Peripheral vascular disease, unspecified: Secondary | ICD-10-CM | POA: Diagnosis present

## 2014-11-27 DIAGNOSIS — I252 Old myocardial infarction: Secondary | ICD-10-CM | POA: Diagnosis not present

## 2014-11-27 DIAGNOSIS — N179 Acute kidney failure, unspecified: Secondary | ICD-10-CM | POA: Diagnosis not present

## 2014-11-27 DIAGNOSIS — E785 Hyperlipidemia, unspecified: Secondary | ICD-10-CM | POA: Insufficient documentation

## 2014-11-27 DIAGNOSIS — R112 Nausea with vomiting, unspecified: Secondary | ICD-10-CM | POA: Diagnosis present

## 2014-11-27 DIAGNOSIS — I2510 Atherosclerotic heart disease of native coronary artery without angina pectoris: Secondary | ICD-10-CM | POA: Insufficient documentation

## 2014-11-27 DIAGNOSIS — Z8673 Personal history of transient ischemic attack (TIA), and cerebral infarction without residual deficits: Secondary | ICD-10-CM | POA: Insufficient documentation

## 2014-11-27 DIAGNOSIS — I255 Ischemic cardiomyopathy: Secondary | ICD-10-CM | POA: Insufficient documentation

## 2014-11-27 DIAGNOSIS — Z23 Encounter for immunization: Secondary | ICD-10-CM | POA: Insufficient documentation

## 2014-11-27 DIAGNOSIS — I10 Essential (primary) hypertension: Secondary | ICD-10-CM | POA: Diagnosis present

## 2014-11-27 DIAGNOSIS — K5900 Constipation, unspecified: Secondary | ICD-10-CM | POA: Diagnosis not present

## 2014-11-27 DIAGNOSIS — R109 Unspecified abdominal pain: Secondary | ICD-10-CM | POA: Diagnosis not present

## 2014-11-27 DIAGNOSIS — I701 Atherosclerosis of renal artery: Secondary | ICD-10-CM | POA: Diagnosis present

## 2014-11-27 DIAGNOSIS — I160 Hypertensive urgency: Secondary | ICD-10-CM | POA: Diagnosis present

## 2014-11-27 DIAGNOSIS — R339 Retention of urine, unspecified: Secondary | ICD-10-CM | POA: Insufficient documentation

## 2014-11-27 DIAGNOSIS — E1122 Type 2 diabetes mellitus with diabetic chronic kidney disease: Secondary | ICD-10-CM | POA: Insufficient documentation

## 2014-11-27 DIAGNOSIS — F03C0 Unspecified dementia, severe, without behavioral disturbance, psychotic disturbance, mood disturbance, and anxiety: Secondary | ICD-10-CM | POA: Diagnosis present

## 2014-11-27 DIAGNOSIS — N189 Chronic kidney disease, unspecified: Secondary | ICD-10-CM

## 2014-11-27 HISTORY — DX: Ischemic cardiomyopathy: I25.5

## 2014-11-27 LAB — URINALYSIS, ROUTINE W REFLEX MICROSCOPIC
BILIRUBIN URINE: NEGATIVE
Glucose, UA: NEGATIVE mg/dL
HGB URINE DIPSTICK: NEGATIVE
Ketones, ur: NEGATIVE mg/dL
Leukocytes, UA: NEGATIVE
Nitrite: NEGATIVE
PROTEIN: 100 mg/dL — AB
Specific Gravity, Urine: 1.008 (ref 1.005–1.030)
UROBILINOGEN UA: 0.2 mg/dL (ref 0.0–1.0)
pH: 7 (ref 5.0–8.0)

## 2014-11-27 LAB — URINE MICROSCOPIC-ADD ON

## 2014-11-27 LAB — COMPREHENSIVE METABOLIC PANEL
ALBUMIN: 4.1 g/dL (ref 3.5–5.0)
ALK PHOS: 97 U/L (ref 38–126)
ALT: 18 U/L (ref 14–54)
ANION GAP: 10 (ref 5–15)
AST: 27 U/L (ref 15–41)
BILIRUBIN TOTAL: 0.6 mg/dL (ref 0.3–1.2)
BUN: 42 mg/dL — AB (ref 6–20)
CALCIUM: 9.2 mg/dL (ref 8.9–10.3)
CO2: 23 mmol/L (ref 22–32)
Chloride: 101 mmol/L (ref 101–111)
Creatinine, Ser: 2.93 mg/dL — ABNORMAL HIGH (ref 0.44–1.00)
GFR calc Af Amer: 16 mL/min — ABNORMAL LOW (ref >60)
GFR calc non Af Amer: 14 mL/min — ABNORMAL LOW (ref >60)
GLUCOSE: 94 mg/dL (ref 65–99)
Potassium: 3.5 mmol/L (ref 3.5–5.1)
Sodium: 134 mmol/L — ABNORMAL LOW (ref 135–145)
Total Protein: 7.7 g/dL (ref 6.5–8.1)

## 2014-11-27 LAB — CBC
HCT: 29.4 % — ABNORMAL LOW (ref 36.0–46.0)
Hemoglobin: 10.1 g/dL — ABNORMAL LOW (ref 12.0–15.0)
MCH: 31.3 pg (ref 26.0–34.0)
MCHC: 34.4 g/dL (ref 30.0–36.0)
MCV: 91 fL (ref 78.0–100.0)
PLATELETS: 343 10*3/uL (ref 150–400)
RBC: 3.23 MIL/uL — ABNORMAL LOW (ref 3.87–5.11)
RDW: 11.2 % — AB (ref 11.5–15.5)
WBC: 6.5 10*3/uL (ref 4.0–10.5)

## 2014-11-27 LAB — LIPASE, BLOOD: Lipase: 28 U/L (ref 22–51)

## 2014-11-27 MED ORDER — ONDANSETRON HCL 4 MG/2ML IJ SOLN
4.0000 mg | Freq: Once | INTRAMUSCULAR | Status: DC | PRN
  Administered 2014-11-27: 4 mg via INTRAVENOUS
  Filled 2014-11-27: qty 2

## 2014-11-27 MED ORDER — MORPHINE SULFATE (PF) 4 MG/ML IV SOLN
4.0000 mg | Freq: Once | INTRAVENOUS | Status: DC | PRN
  Administered 2014-11-27: 4 mg via INTRAVENOUS
  Filled 2014-11-27: qty 1

## 2014-11-27 MED ORDER — HYDRALAZINE HCL 20 MG/ML IJ SOLN
10.0000 mg | INTRAMUSCULAR | Status: AC
  Administered 2014-11-27: 10 mg via INTRAVENOUS
  Filled 2014-11-27: qty 1

## 2014-11-27 MED ORDER — CLOPIDOGREL BISULFATE 75 MG PO TABS
75.0000 mg | ORAL_TABLET | Freq: Every day | ORAL | Status: DC
  Administered 2014-11-28 – 2014-11-29 (×2): 75 mg via ORAL
  Filled 2014-11-27 (×3): qty 1

## 2014-11-27 MED ORDER — ADULT MULTIVITAMIN W/MINERALS CH
1.0000 | ORAL_TABLET | Freq: Every day | ORAL | Status: DC
  Administered 2014-11-28 – 2014-11-29 (×2): 1 via ORAL
  Filled 2014-11-27 (×2): qty 1

## 2014-11-27 MED ORDER — SODIUM CHLORIDE 0.9 % IV SOLN
INTRAVENOUS | Status: AC
  Administered 2014-11-27: 22:00:00 via INTRAVENOUS

## 2014-11-27 MED ORDER — INSULIN ASPART 100 UNIT/ML ~~LOC~~ SOLN
0.0000 [IU] | SUBCUTANEOUS | Status: DC
  Administered 2014-11-28 (×2): 2 [IU] via SUBCUTANEOUS
  Filled 2014-11-27 (×3): qty 1

## 2014-11-27 MED ORDER — CALCITRIOL 0.25 MCG PO CAPS
0.2500 ug | ORAL_CAPSULE | Freq: Every day | ORAL | Status: DC
  Administered 2014-11-28 – 2014-11-29 (×2): 0.25 ug via ORAL
  Filled 2014-11-27 (×3): qty 1

## 2014-11-27 MED ORDER — SODIUM CHLORIDE 0.9 % IV BOLUS (SEPSIS)
1000.0000 mL | Freq: Once | INTRAVENOUS | Status: AC
  Administered 2014-11-27: 1000 mL via INTRAVENOUS

## 2014-11-27 MED ORDER — AMLODIPINE BESYLATE 10 MG PO TABS
10.0000 mg | ORAL_TABLET | Freq: Every day | ORAL | Status: DC
  Administered 2014-11-28 – 2014-11-29 (×2): 10 mg via ORAL
  Filled 2014-11-27 (×3): qty 1

## 2014-11-27 MED ORDER — ENALAPRIL MALEATE 20 MG PO TABS
20.0000 mg | ORAL_TABLET | Freq: Two times a day (BID) | ORAL | Status: DC
  Administered 2014-11-28 – 2014-11-29 (×4): 20 mg via ORAL
  Filled 2014-11-27 (×7): qty 1

## 2014-11-27 MED ORDER — METOPROLOL SUCCINATE ER 50 MG PO TB24
50.0000 mg | ORAL_TABLET | Freq: Every day | ORAL | Status: DC
  Administered 2014-11-28 – 2014-11-29 (×2): 50 mg via ORAL
  Filled 2014-11-27 (×3): qty 1

## 2014-11-27 MED ORDER — POLYSACCHARIDE IRON COMPLEX 150 MG PO CAPS
150.0000 mg | ORAL_CAPSULE | Freq: Two times a day (BID) | ORAL | Status: DC
  Administered 2014-11-28 – 2014-11-29 (×4): 150 mg via ORAL
  Filled 2014-11-27 (×6): qty 1

## 2014-11-27 MED ORDER — CLONIDINE HCL 0.2 MG/24HR TD PTWK
0.2000 mg | MEDICATED_PATCH | TRANSDERMAL | Status: DC
  Administered 2014-11-28: 0.2 mg via TRANSDERMAL
  Filled 2014-11-27: qty 1

## 2014-11-27 MED ORDER — HYDRALAZINE HCL 10 MG PO TABS
10.0000 mg | ORAL_TABLET | Freq: Two times a day (BID) | ORAL | Status: DC
  Administered 2014-11-28 (×2): 10 mg via ORAL
  Filled 2014-11-27 (×4): qty 1

## 2014-11-27 MED ORDER — PRAVASTATIN SODIUM 40 MG PO TABS
40.0000 mg | ORAL_TABLET | Freq: Every day | ORAL | Status: DC
  Administered 2014-11-28 – 2014-11-29 (×2): 40 mg via ORAL
  Filled 2014-11-27 (×3): qty 1

## 2014-11-27 NOTE — ED Notes (Signed)
Bed: SW10 Expected date:  Expected time:  Means of arrival:  Comments: EMS Rib pain

## 2014-11-27 NOTE — H&P (Signed)
Date: 11/28/2014               Patient Name:  [name] MRN: [id number]  DOB: 12-30-1931 Age / Sex: 81 y.o., female   PCP: Milagros Loll, MD         Medical Service: Internal Medicine Teaching Service         Attending Physician: Dr. Carlyle Basques, MD    First Contact: Dr. Jule Ser Pager: [phone]  Second Contact: Dr. Dellia Nims Pager: [phone]       After Hours (After 5p/  First Contact Pager: [phone]  weekends / holidays): Second Contact Pager: [phone]   Chief Complaint: Flank Pain  History of Present Illness: [name] is an 81 yo woman with a PMH of CKD Stage IV, severe left renal artery stenosis with failed stent and refractory HTN, ischemic cardiomyopathy, diabetes mellitus, and severe dementia who presents with left inferior rib cage and flank pain. That patient's daughter reported that the pain had started yesterday morning at her back and has been worsening since. The daughter applied some lotion that was ineffective. The patient then had four episodes of non-bloody, non-bilious vomiting and called 911.   The patient is followed by [facility], who manage her RAS and have concluded that her right stent (placed in 2004) is not amenable to re-intervention. Previous notes from [facility] suggest there have been issues with adherence in the past with regard to her blood pressure medication, and that it is never clear which medications she is taking or not taking. A NM perfusion scan from 2015 showed significantly diminished perfusion of left kidney, with renal function coming 90% from R kidney, and 10% from L kidney.  In the ED, her creatinine was noted to be 2.93, above her baseline of 2.68. CT scan demonstrated no nephrolithiasis or ureteral calculi, but did show a distended bladder. Bladder scan showed that she was retaining 500 mLs post-voiding, and a foley was placed. She was given a 1L bolus of NS. Her BP was 223/89, and she was given 10 mg  hydrazlazine IV.   Meds: Current Facility-Administered Medications  Medication Dose Route Frequency Provider Last Rate Last Dose  . 0.9 %  sodium chloride infusion  250 mL Intravenous PRN Lucious Groves, DO      . acetaminophen (TYLENOL) tablet 650 mg  650 mg Oral Q6H PRN Lucious Groves, DO       Or  . acetaminophen (TYLENOL) suppository 650 mg  650 mg Rectal Q6H PRN Lucious Groves, DO      . amLODipine (NORVASC) tablet 10 mg  10 mg Oral Daily Margarita Mail, PA-C   10 mg at 11/28/14 1219  . calcitRIOL (ROCALTROL) capsule 0.25 mcg  0.25 mcg Oral Daily Margarita Mail, PA-C   0.25 mcg at 11/28/14 1220  . cloNIDine (CATAPRES - Dosed in mg/24 hr) patch 0.2 mg  0.2 mg Transdermal Weekly Margarita Mail, PA-C   0.2 mg at 11/28/14 0142  . clopidogrel (PLAVIX) tablet 75 mg  75 mg Oral Daily Margarita Mail, PA-C   75 mg at 11/28/14 1220  . docusate sodium (COLACE) capsule 100 mg  100 mg Oral Daily Jule Ser, DO      . enalapril (VASOTEC) tablet 20 mg  20 mg Oral BID Margarita Mail, PA-C   20 mg at 11/28/14 1434  . feeding supplement (ENSURE ENLIVE) (ENSURE ENLIVE) liquid 237 mL  237 mL Oral BID BM Carlyle Basques, MD   [id number]  mL at 11/28/14 1518  . furosemide (LASIX) tablet 60 mg  60 mg Oral Daily Lucious Groves, DO      . heparin injection 5,000 Units  5,000 Units Subcutaneous 3 times per day Lucious Groves, DO      . hydrALAZINE (APRESOLINE) tablet 10 mg  10 mg Oral BID Margarita Mail, PA-C   10 mg at 11/28/14 1220  . [START ON 11/29/2014] Influenza vac split quadrivalent PF (FLUARIX) injection 0.5 mL  0.5 mL Intramuscular Tomorrow-1000 Carlyle Basques, MD      . insulin aspart (novoLOG) injection 0-24 Units  0-24 Units Subcutaneous 6 times per day Margarita Mail, PA-C   8 Units at 11/28/14 1519  . insulin aspart (novoLOG) injection 0-5 Units  0-5 Units Subcutaneous QHS Lucious Groves, DO      . insulin aspart (novoLOG) injection 0-9 Units  0-9 Units Subcutaneous TID WC Lucious Groves, DO      . iron  polysaccharides (NIFEREX) capsule 150 mg  150 mg Oral BID Margarita Mail, PA-C   150 mg at 11/28/14 1219  . metoprolol succinate (TOPROL-XL) 24 hr tablet 50 mg  50 mg Oral Daily Margarita Mail, PA-C   50 mg at 11/28/14 1219  . multivitamin with minerals tablet 1 tablet  1 tablet Oral Daily Margarita Mail, PA-C   1 tablet at 11/28/14 1220  . ondansetron (ZOFRAN) tablet 4 mg  4 mg Oral Q6H PRN Lucious Groves, DO       Or  . ondansetron [facility]) injection 4 mg  4 mg Intravenous Q6H PRN Lucious Groves, DO      . polyethylene glycol (MIRALAX / GLYCOLAX) packet 17 g  17 g Oral Daily Jule Ser, DO      . pravastatin (PRAVACHOL) tablet 40 mg  40 mg Oral Daily Margarita Mail, PA-C   40 mg at 11/28/14 1219  . sodium chloride 0.9 % injection 3 mL  3 mL Intravenous Q12H Lucious Groves, DO      . sodium chloride 0.9 % injection 3 mL  3 mL Intravenous PRN Lucious Groves, DO        Allergies: Allergies as of 11/27/2014  . (No Known Allergies)   Past Medical History  Diagnosis Date  . Hyperlipidemia   . Hypertension   . Diabetes mellitus   . Chronic kidney disease     stage IV  . RAS (renal artery stenosis)   . Stroke     s/p CVA unknown timing  . Ischemic cardiomyopathy     EF 20%  . Dementia   . CAD (coronary artery disease)     s/p PTCA May 2005, stenting of an intermittent blanch of the CMX and the angioplasty of teh diagonal branch to the left descending  . Cardiomyopathy, ischemic    Past Surgical History  Procedure Laterality Date  . Appendectomy    . Total abdominal hysterectomy w/ bilateral salpingoophorectomy    . Angioplasty      and stent   Family History  Problem Relation Age of Onset  . Kidney disease Brother    Social History   Social History  . Marital Status: Married    Spouse Name: N/A  . Number of Children: N/A  . Years of Education: N/A   Occupational History  . Not on file.   Social History Main Topics  . Smoking status: Never Smoker   . Smokeless  tobacco: Never Used  . Alcohol Use: No  .  Drug Use: No  . Sexual Activity: Not on file   Other Topics Concern  . Not on file   Social History Narrative   Patient gets regular exercise- walking   Lives with husband and son; son manages medications      No FH of colon cancer    Review of Systems: Review of Systems  Unable to perform ROS: dementia    Physical Exam: Blood pressure 172/87, pulse 75, temperature 98.2 F (36.8 C), temperature source Oral, resp. rate 18, height 5\' 4"  (1.626 m), weight 121 lb 0.5 oz (54.9 kg), SpO2 98 %.   General: sleeping in bed, easily arousable, pleasantly demented,  NAD. HEENT: EOMI. Moist mucus membranes Neck: Full range of motion without pain Lungs: Clear to ascultation bilaterally, normal work of respiration, no wheezes, rales, rhonchi Heart: RRR, no murmurs, gallops, or rubs GU: Foley in place, no CVA tenderness MSK: no tenderness to palpation of thoracic or lumbar spine Abdomen: Soft, non-tender, non-distended Extremities: No cyanosis, clubbing, or edema Neurologic: Alert but not oriented to place, time, or event  Lab results: Basic Metabolic Panel:  Recent Labs  11/27/14 1437  NA 134*  K 3.5  CL 101  CO2 23  GLUCOSE 94  BUN 42*  CREATININE 2.93*  CALCIUM 9.2   Liver Function Tests:  Recent Labs  11/27/14 1437  AST 27  ALT 18  ALKPHOS 97  BILITOT 0.6  PROT 7.7  ALBUMIN 4.1    Recent Labs  11/27/14 1437  LIPASE 28   CBC:  Recent Labs  11/27/14 1437  WBC 6.5  HGB 10.1*  HCT 29.4*  MCV 91.0  PLT 343   Urinalysis:  Recent Labs  11/27/14 1530  COLORURINE YELLOW  LABSPEC 1.008  PHURINE 7.0  GLUCOSEU NEGATIVE  HGBUR NEGATIVE  BILIRUBINUR NEGATIVE  KETONESUR NEGATIVE  PROTEINUR 100*  UROBILINOGEN 0.2  NITRITE NEGATIVE  LEUKOCYTESUR NEGATIVE     Imaging results:  Dg Abd Acute W/chest  11/27/2014   CLINICAL DATA:  Chest and left flank pain since this morning. Vomiting.  EXAM: DG ABDOMEN ACUTE  W/ 1V CHEST  COMPARISON:  Chest x-ray 07/17/2011  FINDINGS: The upright chest x-ray demonstrates mild stable cardiac enlargement and tortuosity and calcification of the thoracic aorta. No acute pulmonary findings. No pleural effusion.  Two views of the abdomen demonstrate a normal bowel gas pattern. No findings for obstruction an or perforation. The soft tissue shadows are maintained. No worrisome calcifications. A left renal artery stent is noted. Moderate vascular calcifications. There are advanced hip joint degenerative changes, right much greater than left.  IMPRESSION: No acute cardiopulmonary findings.  No plain film findings for an acute abdominal process.   Electronically Signed   By: Marijo Sanes M.D.   On: 11/27/2014 15:52   Ct Renal Stone Study  11/27/2014   CLINICAL DATA:  Left flank pain  EXAM: CT ABDOMEN AND PELVIS WITHOUT CONTRAST  TECHNIQUE: Multidetector CT imaging of the abdomen and pelvis was performed following the standard protocol without IV contrast.  COMPARISON:  CT report 08/05/2002 no images available  FINDINGS: Lung bases are unremarkable. Sagittal images of the spine shows degenerative changes thoracolumbar spine. Extensive atherosclerotic calcifications of abdominal aorta, SMA, splenic artery and iliac arteries are noted. No aortic aneurysm.  The left kidney is atrophic. Left renal artery stent is noted. Vascular calcifications are noted bilateral renal hilum. Unenhanced liver shows no biliary ductal dilatation. Unenhanced pancreas and spleen are unremarkable. Bilateral mild thickening of adrenal glands without  definite mass. Small hiatal hernia.  Moderate stool throughout the colon. No pericecal inflammation. Few colonic diverticula are noted right colon. No evidence of acute diverticulitis. There is a distended urinary bladder. The uterus is atrophic. No adnexal mass. Pelvic phleboliths are noted. No calcified calculi are noted within urinary bladder. Bilateral distal ureter is  unremarkable.  No small bowel obstruction. No ascites or free air. No adenopathy. There is no inguinal adenopathy. Some colonic gas noted within rectum. Nonspecific thickening of anal wall. Clinical correlation is necessary.  IMPRESSION: 1. Extensive atherosclerotic vascular calcifications are noted. 2. No nephrolithiasis. No calcified ureteral calculi. There is atrophic left kidney. Left renal artery stent is noted. 3. Moderate stool noted throughout the colon. 4. Right colon diverticula.  No evidence of acute diverticulitis. 5. There is a distended urinary bladder. Bilateral distal ureter is unremarkable. Pelvic phleboliths are noted. 6. Atrophic uterus. 7. No small bowel obstruction.   Electronically Signed   By: Lahoma Crocker M.D.   On: 11/27/2014 17:11    Other results: EKG: Ventricular Rate: 98 PR Interval: 154 QRS Duration: 110 QT Interval: 386 QTC Calculation: 493 R Axis: -41 Text Interpretation: Sinus rhythm Probable left atrial enlargement LVH  with IVCD, LAD and secondary repol abnrm Borderline prolonged QT interval   Assessment & Plan by Problem: Principal Problem:   Left flank pain Active Problems:   Diabetes 1.5, managed as type 2   Essential hypertension   Coronary atherosclerosis   RENAL ARTERY STENOSIS   Peripheral vascular disease   KIDNEY DISEASE, CHRONIC, STAGE IV   Severe dementia   Hypertensive urgency   Nausea and vomiting   Anemia in CKD (chronic kidney disease)  Hypertensive Urgency Secondary to Renal Artery Stenosis: Her baseline creatinine has been progressively worsening over the past few years. Previous notes suggest that adherence to medications with outpatient BPs of 200/109 while asymptomatic. Home health nurses state that systolics ranging from 308M-578I without symptoms.  - Hydralazine 10 mg po PID - Enalapril 20 mg BID - Clonidine patch (Catapres) 0.2 mg weekly - Metoprolol 50 mg daily (24 hr tablet) - Amlodipine 10 mg daily -Lasix 60mg  PO  daily -can consider increasing metoprolol if becomes bradycardic or increasing hydralazine in needed for HTN  Flank Pain: No evidence of pyelonephritis or renal infarction on CT (sensitivity 80%). However, given her progressively worsening renal function and new onset flank pain, pre-test probability of an occlusion with developing infarction is high. She may require revascularization efforts or nephrectomy if this is the case. However, re-stenting is typically not recommended given there is no change in the primary outcome of cardiovascular and renal death.  -Pain appears to have resolved with no current signs of pain on exam or per the patient -Zofran 4mg  q6h prn  Acute on Chronic Kidney Disease (Stage IV):  Creatinine has increased steadily over past year (2.18 on 01/03/14, 2.68 05/2014, 2.93 today). The patient could be volume down due to episodes of vomiting; however, it is unclear if this is a true AKI or worsening CKD in setting of progressing RAS.   -follow up BMPs  Urinary Retention: patient retaining about 568ml of urine after voiding in the ED and so was given a Foley catheter.   -Discontinue foley cath -Encourage frequent voiding -I & O's every shift  Type 1.5 Diabetes: On Humulin (70/30) at home 18U AM, and 6U in evening -Sliding scale insulin - sensitive tid with meals plus HS coverage -Novolog 3 units tid w/ meals  Coronary Artery  Disease with Ischemic Cardiomyopathy:  Previous MI with ischemic cardiomyopathy (Echo in 2013 showed EF 50-55 with Grade II dCHF). No evidence of volume overload. - Continue home clopidogrel 75 mg - Continue pravastatin 40 mg daily - Continue Lasix 60mg  daily  Secondary Hyperparathyroidism:  Calcium normal at 9.2.  - Continue home Calcitriol 0.25 mcg daily  Anemia: HgB stable at 10.1 (10.8 on 05/2014) - Continue to monitor - Continue home ferrex 150 mg BID  Constipation: -Colace 100mg  daily -Miralax daily  Dementia: Stable -will have PT  evaluate for home health needs.  Family does not want SNF or other nursing facility.  Diet: Heart healthy  DVT PPx: Heparin  Code: Full - planning to discuss [place] with family this evening at approximately 7:30pm.  At this time, patient to remain FULL code  Dispo: Disposition is deferred at this time, awaiting improvement of current medical problems. Anticipated discharge in approximately 1-2 day(s).   The patient does have a current PCP Milagros Loll, MD) and does need an [facility] hospital follow-up appointment after discharge.  The patient does have transportation limitations that hinder transportation to clinic appointments.  Signed: Jule Ser, DO 11/28/2014, 3:19 PM

## 2014-11-27 NOTE — ED Notes (Signed)
[facility] en route.  ETA 10 minutes

## 2014-11-27 NOTE — ED Provider Notes (Signed)
CSN: [id number]     Arrival date & time 11/27/14  1354 History   First MD Initiated Contact with Patient 11/27/14 1510     Chief Complaint  Patient presents with  . Flank Pain     (Consider location/radiation/quality/duration/timing/severity/associated sxs/prior Treatment) HPI  [name] Is an 81 year old female with a past medical history of diabetes, chronic kidney disease, renal artery stenosis, ischemic cardiomyopathy, history of previous MI per the family, and moderate to severe dementia. She lives with one of her children. History is given by her daughter who is with her this morning. She states that this morning her mother began complaining of pain in the left rib cage and back. She continued complaining stating "it hurts, it hurts, it hurts." Her daughter states that normally she does not complain. Her daughter states she applied some liniment to her back, to try to ease her pain over she continued complaining. She suddenly became sick and vomited at least 4 time NBNB vomitus. Her daughters called 103 and had her transported to the emergency department. Patient states that the pain comes and then "runs away from me." she is unable to pinpoint the location of pain. She has had increased urinary frequency. No constipation.  Past Medical History  Diagnosis Date  . Hyperlipidemia   . Hypertension   . Diabetes mellitus   . Chronic kidney disease     stage IV  . RAS (renal artery stenosis)   . Stroke     s/p CVA unknown timing  . Ischemic cardiomyopathy     EF 20%  . Dementia   . CAD (coronary artery disease)     s/p PTCA May 2005, stenting of an intermittent blanch of the CMX and the angioplasty of teh diagonal branch to the left descending   Past Surgical History  Procedure Laterality Date  . Appendectomy    . Total abdominal hysterectomy w/ bilateral salpingoophorectomy    . Angioplasty      and stent   Family History  Problem Relation Age of Onset  . Kidney disease  Brother    Social History  Substance Use Topics  . Smoking status: Never Smoker   . Smokeless tobacco: Never Used  . Alcohol Use: No   OB History    No data available     Review of Systems  Ten systems reviewed and are negative for acute change, except as noted in the HPI.    Allergies  Review of patient's allergies indicates no known allergies.  Home Medications   Prior to Admission medications   Medication Sig Start Date End Date Taking? Authorizing Provider  amLODipine (NORVASC) 10 MG tablet TAKE ONE (1) TABLET EACH DAY 03/29/14  Yes Luan Moore, MD  BD INSULIN SYRINGE ULTRAFINE 31G X 5/16" 0.3 ML MISC 1 SYRINGE BY DOES NOT APPLY ROUTE 2 (TWO) TIMES DAILY. 04/22/14  Yes Luan Moore, MD  calcitRIOL (ROCALTROL) 0.25 MCG capsule Take 1 capsule (0.25 mcg total) by mouth daily. 04/06/14  Yes Luan Moore, MD  cloNIDine (CATAPRES - DOSED IN MG/24 HR) 0.2 mg/24hr patch Place 1 patch (0.2 mg total) onto the skin once a week. 11/16/14  Yes Milagros Loll, MD  clopidogrel (PLAVIX) 75 MG tablet Take 1 tablet (75 mg total) by mouth daily. 08/04/14  Yes Milagros Loll, MD  enalapril (VASOTEC) 20 MG tablet TAKE 2 TABLETS (40 MG TOTAL) BY MOUTH DAILY. 08/28/14  Yes Milagros Loll, MD  FERREX 150 150 MG capsule Take 150 mg  by mouth 2 (two) times daily. 10/12/14  Yes Historical Provider, MD  furosemide (LASIX) 40 MG tablet TAKE 1 AND 1/2 TABLETS (60 MG TOTAL) BY MOUTH DAILY. 03/29/14  Yes Luan Moore, MD  glucose blood (ACCU-CHEK AVIVA PLUS) test strip Use to test blood glucose 3 times daily. Dx: E13.9 07/12/14  Yes Luan Moore, MD  hydrALAZINE (APRESOLINE) 10 MG tablet Take 1 tablet (10 mg total) by mouth 2 (two) times daily. 10/27/14  Yes Milagros Loll, MD  Insulin Isophane & Regular Human (HUMULIN 70/30 MIX) (70-30) 100 UNIT/ML PEN Inject subcutaneously 18 units in the morning and 6 units in the evening 10/07/14  Yes Milagros Loll, MD  Insulin Pen Needle (ULTRA-THIN II MINI PEN NEEDLE) 31G  X 5 MM MISC Use to inject insulin twice a day. Dx code: E13.9 10/07/14  Yes Milagros Loll, MD  metoprolol succinate (TOPROL-XL) 50 MG 24 hr tablet TAKE 1 TABLET BY MOUTH EVERY DAY WITH OR IMMEDIATELY FOLLOWING A MEAL 11/16/14  Yes Milagros Loll, MD  Multiple Vitamin (MULITIVITAMIN WITH MINERALS) TABS Take 1 tablet by mouth daily.   Yes Historical Provider, MD  pravastatin (PRAVACHOL) 40 MG tablet TAKE ONE (1) TABLET EACH                EVENING 11/16/14  Yes Milagros Loll, MD  feeding supplement (GLUCERNA SHAKE) LIQD Take 237 mLs by mouth 3 (three) times daily between meals. Patient not taking: Reported on 11/27/2014 01/06/12   Dorian Heckle, MD   BP 220/118 mmHg  Pulse 96  Temp(Src) 97.5 F (36.4 C) (Oral)  Resp 20  SpO2 98% Physical Exam  Constitutional: She is oriented to person, place, and time. She appears well-developed and well-nourished. No distress.  HENT:  Head: Normocephalic and atraumatic.  Eyes: Conjunctivae are normal. No scleral icterus.  Neck: Normal range of motion.  Cardiovascular: Normal rate, regular rhythm and normal heart sounds.  Exam reveals no gallop and no friction rub.   No murmur heard. Pulmonary/Chest: Effort normal and breath sounds normal. No respiratory distress.  Abdominal: Soft. Bowel sounds are normal. She exhibits no distension and no mass. There is no tenderness. There is no guarding.  Neurological: She is alert and oriented to person, place, and time.  Skin: Skin is warm and dry. She is not diaphoretic.    ED Course  Procedures (including critical care time) Labs Review Labs Reviewed  COMPREHENSIVE METABOLIC PANEL - Abnormal; Notable for the following:    Sodium 134 (*)    BUN 42 (*)    Creatinine, Ser 2.93 (*)    GFR calc non Af Amer 14 (*)    GFR calc Af Amer 16 (*)    All other components within normal limits  CBC - Abnormal; Notable for the following:    RBC 3.23 (*)    Hemoglobin 10.1 (*)    HCT 29.4 (*)    RDW 11.2 (*)    All  other components within normal limits  LIPASE, BLOOD  URINALYSIS, ROUTINE W REFLEX MICROSCOPIC (NOT AT [facility])    Imaging Review No results found. I have personally reviewed and evaluated these images and lab results as part of my medical decision-making.   EKG Interpretation   Date/Time:  Sunday November 27 2014 14:07:42 EDT Ventricular Rate:  98 PR Interval:  154 QRS Duration: 110 QT Interval:  386 QTC Calculation: 493 R Axis:   -41 Text Interpretation:  Sinus rhythm Probable left atrial enlargement LVH  with  IVCD, LAD and secondary repol abnrm Borderline prolonged QT interval  Confirmed by Rogene Houston  MD, SCOTT [id number]) on 11/27/2014 2:12:40 PM      MDM   Final diagnoses:  Left flank pain    Patient seen in shared visit with attending physician. Labs are reassuring. She does appear to have a slight elevation in her creatinine.  No current pain or nausea.   Patient CT scan negative for any acute abnormalities. Her bladder does appear to be distended. Patient just urinated. The take a post-residual bladder scan.   6:56 PM Patient retaining about 500 ML's of urine after voiding. Have ordered a Foley catheter replaced. Patient with a history of renal artery stenosis with previous stent placement. She has had markedly elevated blood pressure throughout her visit here. In the setting of her worsening renal function. This may be a hypertensive urgency with hypertensive nephropathy. She is taking Omnicef for antihypertensive medications and to take all of her medicines today. Have ordered 10 mg IV hydralazine. Patient will be admitted to the internal medicine teaching service. At [facility]. Family made aware. He agrees with plan of care.   Margarita Mail, PA-C 11/27/14 1952  Noemi Chapel, MD 11/27/14 [phone]

## 2014-11-27 NOTE — ED Notes (Signed)
Pt from home via EMS c/o L lateral rib/flank pain. Pt sts that she ate breakfast this am, started to have side pain and vomited 3 times. Pt denies injury. Pt has hx of dementia. Pt had MI 10 years ago. Pt is at baseline per pt daughter. Pt in NAD

## 2014-11-27 NOTE — ED Provider Notes (Signed)
The patient is an 81 year old female, she has dementia, she has been complaining of left flank and left lower chest pain intermittently, she states it just runs away and comes back. - she has no pain on exam - normal lungs and heart - labs and CT ordered, anticipate d/c unless other findings present.  Medical screening examination/treatment/procedure(s) were conducted as a shared visit with non-physician practitioner(s) and myself.  I personally evaluated the patient during the encounter.  Clinical Impression:   Final diagnoses:  Left flank pain  AKI (acute kidney injury)  Hypertensive urgency  Urinary retention         Noemi Chapel, MD 11/27/14 2344

## 2014-11-27 NOTE — ED Notes (Signed)
Attempted to call report to 6N at [facility]. Nurse is unavailable and will call back.

## 2014-11-27 NOTE — ED Notes (Signed)
Pt is on bedpan at this time

## 2014-11-28 ENCOUNTER — Encounter (HOSPITAL_COMMUNITY): Payer: Self-pay | Admitting: General Practice

## 2014-11-28 DIAGNOSIS — R109 Unspecified abdominal pain: Secondary | ICD-10-CM | POA: Diagnosis present

## 2014-11-28 DIAGNOSIS — D631 Anemia in chronic kidney disease: Secondary | ICD-10-CM | POA: Diagnosis present

## 2014-11-28 DIAGNOSIS — R112 Nausea with vomiting, unspecified: Secondary | ICD-10-CM | POA: Diagnosis present

## 2014-11-28 DIAGNOSIS — N189 Chronic kidney disease, unspecified: Secondary | ICD-10-CM

## 2014-11-28 LAB — GLUCOSE, CAPILLARY
GLUCOSE-CAPILLARY: 203 mg/dL — AB (ref 65–99)
GLUCOSE-CAPILLARY: 285 mg/dL — AB (ref 65–99)
Glucose-Capillary: 146 mg/dL — ABNORMAL HIGH (ref 65–99)

## 2014-11-28 LAB — CBG MONITORING, ED
GLUCOSE-CAPILLARY: 126 mg/dL — AB (ref 65–99)
GLUCOSE-CAPILLARY: 105 mg/dL — AB (ref 65–99)
Glucose-Capillary: 140 mg/dL — ABNORMAL HIGH (ref 65–99)

## 2014-11-28 MED ORDER — SODIUM CHLORIDE 0.9 % IV SOLN: 250.0000 mL | INTRAVENOUS | Status: DC | PRN

## 2014-11-28 MED ORDER — DOCUSATE SODIUM 100 MG PO CAPS
100.0000 mg | ORAL_CAPSULE | Freq: Every day | ORAL | Status: DC
  Administered 2014-11-28 – 2014-11-29 (×2): 100 mg via ORAL
  Filled 2014-11-28 (×2): qty 1

## 2014-11-28 MED ORDER — HEPARIN SODIUM (PORCINE) 5000 UNIT/ML IJ SOLN
5000.0000 [IU] | Freq: Three times a day (TID) | INTRAMUSCULAR | Status: DC
  Administered 2014-11-28 – 2014-11-29 (×2): 5000 [IU] via SUBCUTANEOUS
  Filled 2014-11-28 (×2): qty 1

## 2014-11-28 MED ORDER — POLYETHYLENE GLYCOL 3350 17 G PO PACK
17.0000 g | PACK | Freq: Every day | ORAL | Status: DC
  Administered 2014-11-28 – 2014-11-29 (×2): 17 g via ORAL
  Filled 2014-11-28 (×2): qty 1

## 2014-11-28 MED ORDER — SODIUM CHLORIDE 0.9 % IJ SOLN: 3.0000 mL | INTRAMUSCULAR | Status: DC | PRN

## 2014-11-28 MED ORDER — HALOPERIDOL LACTATE 5 MG/ML IJ SOLN
2.5000 mg | Freq: Once | INTRAMUSCULAR | Status: AC
  Administered 2014-11-28: 2.5 mg via INTRAVENOUS
  Filled 2014-11-28: qty 1

## 2014-11-28 MED ORDER — HALOPERIDOL LACTATE 5 MG/ML IJ SOLN: 2.5000 mg | Freq: Once | INTRAMUSCULAR | Status: DC | PRN

## 2014-11-28 MED ORDER — INFLUENZA VAC SPLIT QUAD 0.5 ML IM SUSY
0.5000 mL | PREFILLED_SYRINGE | INTRAMUSCULAR | Status: AC
  Administered 2014-11-29: 0.5 mL via INTRAMUSCULAR
  Filled 2014-11-28 (×2): qty 0.5

## 2014-11-28 MED ORDER — ONDANSETRON HCL 4 MG PO TABS: 4.0000 mg | ORAL_TABLET | Freq: Four times a day (QID) | ORAL | Status: DC | PRN

## 2014-11-28 MED ORDER — INSULIN ASPART 100 UNIT/ML ~~LOC~~ SOLN
3.0000 [IU] | Freq: Three times a day (TID) | SUBCUTANEOUS | Status: DC
  Administered 2014-11-28 – 2014-11-29 (×3): 3 [IU] via SUBCUTANEOUS

## 2014-11-28 MED ORDER — INSULIN ASPART 100 UNIT/ML ~~LOC~~ SOLN
0.0000 [IU] | Freq: Three times a day (TID) | SUBCUTANEOUS | Status: DC
  Administered 2014-11-28: 5 [IU] via SUBCUTANEOUS
  Administered 2014-11-29 (×2): 2 [IU] via SUBCUTANEOUS

## 2014-11-28 MED ORDER — ENSURE ENLIVE PO LIQD
237.0000 mL | Freq: Two times a day (BID) | ORAL | Status: DC
  Administered 2014-11-28 – 2014-11-29 (×2): 237 mL via ORAL

## 2014-11-28 MED ORDER — SODIUM CHLORIDE 0.9 % IJ SOLN
3.0000 mL | Freq: Two times a day (BID) | INTRAMUSCULAR | Status: DC
  Administered 2014-11-28 – 2014-11-29 (×3): 3 mL via INTRAVENOUS

## 2014-11-28 MED ORDER — ACETAMINOPHEN 325 MG PO TABS
650.0000 mg | ORAL_TABLET | Freq: Four times a day (QID) | ORAL | Status: DC | PRN
  Administered 2014-11-28: 650 mg via ORAL
  Filled 2014-11-28 (×2): qty 2

## 2014-11-28 MED ORDER — ONDANSETRON HCL 4 MG/2ML IJ SOLN: 4.0000 mg | Freq: Four times a day (QID) | INTRAMUSCULAR | Status: DC | PRN

## 2014-11-28 MED ORDER — INSULIN ASPART 100 UNIT/ML ~~LOC~~ SOLN: 0.0000 [IU] | Freq: Every day | SUBCUTANEOUS | Status: DC

## 2014-11-28 MED ORDER — HYDRALAZINE HCL 25 MG PO TABS
25.0000 mg | ORAL_TABLET | Freq: Two times a day (BID) | ORAL | Status: DC
  Administered 2014-11-28 – 2014-11-29 (×2): 25 mg via ORAL
  Filled 2014-11-28 (×2): qty 1

## 2014-11-28 MED ORDER — ACETAMINOPHEN 650 MG RE SUPP: 650.0000 mg | Freq: Four times a day (QID) | RECTAL | Status: DC | PRN

## 2014-11-28 MED ORDER — FUROSEMIDE 40 MG PO TABS
60.0000 mg | ORAL_TABLET | Freq: Every day | ORAL | Status: DC
  Administered 2014-11-28 – 2014-11-29 (×2): 60 mg via ORAL
  Filled 2014-11-28 (×4): qty 1

## 2014-11-28 NOTE — Progress Notes (Signed)
Foley catheter removed per order.  Documented 200cc urine output.  No complications.

## 2014-11-28 NOTE — Progress Notes (Signed)
Paged IMTS regarding family request to talk to MD about the care and treatment of the pt. IMTS MDS are inside the room conversing with pt and family

## 2014-11-28 NOTE — ED Notes (Signed)
Given new Pulse Ox, she was taking off the hard plastic one.

## 2014-11-28 NOTE — Progress Notes (Signed)
BP 186/95;  HR 86.  Notified physician.  Orders written for Hydralazine 25mg  BiD.

## 2014-11-29 LAB — BASIC METABOLIC PANEL
Anion gap: 12 (ref 5–15)
BUN: 38 mg/dL — AB (ref 6–20)
CHLORIDE: 100 mmol/L — AB (ref 101–111)
CO2: 20 mmol/L — ABNORMAL LOW (ref 22–32)
Calcium: 9.3 mg/dL (ref 8.9–10.3)
Creatinine, Ser: 3.02 mg/dL — ABNORMAL HIGH (ref 0.44–1.00)
GFR calc Af Amer: 16 mL/min — ABNORMAL LOW (ref >60)
GFR calc non Af Amer: 14 mL/min — ABNORMAL LOW (ref >60)
GLUCOSE: 214 mg/dL — AB (ref 65–99)
POTASSIUM: 4.1 mmol/L (ref 3.5–5.1)
Sodium: 132 mmol/L — ABNORMAL LOW (ref 135–145)

## 2014-11-29 LAB — CBC
HEMATOCRIT: 40.7 % (ref 36.0–46.0)
Hemoglobin: 13.8 g/dL (ref 12.0–15.0)
MCH: 31.5 pg (ref 26.0–34.0)
MCHC: 33.9 g/dL (ref 30.0–36.0)
MCV: 92.9 fL (ref 78.0–100.0)
Platelets: 241 10*3/uL (ref 150–400)
RBC: 4.38 MIL/uL (ref 3.87–5.11)
RDW: 11.3 % — AB (ref 11.5–15.5)
WBC: 6.4 10*3/uL (ref 4.0–10.5)

## 2014-11-29 LAB — GLUCOSE, CAPILLARY
Glucose-Capillary: 193 mg/dL — ABNORMAL HIGH (ref 65–99)
Glucose-Capillary: 198 mg/dL — ABNORMAL HIGH (ref 65–99)

## 2014-11-29 MED ORDER — HYDRALAZINE HCL 25 MG PO TABS: 25.0000 mg | ORAL_TABLET | Freq: Two times a day (BID) | ORAL | Status: DC

## 2014-11-29 NOTE — Progress Notes (Signed)
Soft mitten restraints was never applied. pts family in room helping pt calm down. Pt less agitated and took meds. Compliant in returning to bed. And fell asleep after haldol was given. Tele on . Continued to monitor pt closely.

## 2014-11-29 NOTE — Progress Notes (Signed)
Date: 11/29/2014  Patient name: [name]  Medical record number: [id number]  Date of birth: Apr 02, 1931   History of Present Illness: [name] is an 81 yo woman with a PMH of CKD Stage IV, severe left renal artery stenosis with failed stent and refractory HTN, ischemic cardiomyopathy, diabetes mellitus, and severe dementia who presents with left inferior rib cage/ flank pain as well as four episodes of non-bloody, non-bilious vomiting. She was brought to ED for evaluation. She was found to have slightly elevate cr above her baseline: cr of  2.93, above her baseline of 2.68. CT scan demonstrated no nephrolithiasis or ureteral calculi, but did show a distended bladder. Bladder scan showed that she was retaining 500 mLs post-voiding, and a foley was placed. She was given a 1L bolus of NS. Her BP was 223/89, and she was given 10 mg hydrazlazine IV. She no longer needed foley and is able to void on her own without discomfort. She is back to baseline per her family. She has not had further emesis.  No Known Allergies No current facility-administered medications on file prior to encounter.   Current Outpatient Prescriptions on File Prior to Encounter  Medication Sig Dispense Refill  . amLODipine (NORVASC) 10 MG tablet TAKE ONE (1) TABLET EACH DAY 90 tablet 3  . BD INSULIN SYRINGE ULTRAFINE 31G X 5/16" 0.3 ML MISC 1 SYRINGE BY DOES NOT APPLY ROUTE 2 (TWO) TIMES DAILY. 60 each 3  . calcitRIOL (ROCALTROL) 0.25 MCG capsule Take 1 capsule (0.25 mcg total) by mouth daily. 90 capsule 3  . cloNIDine (CATAPRES - DOSED IN MG/24 HR) 0.2 mg/24hr patch Place 1 patch (0.2 mg total) onto the skin once a week. 12 patch 3  . clopidogrel (PLAVIX) 75 MG tablet Take 1 tablet (75 mg total) by mouth daily. 90 tablet 4  . enalapril (VASOTEC) 20 MG tablet TAKE 2 TABLETS (40 MG TOTAL) BY MOUTH DAILY. 60 tablet 3  . FERREX 150 150 MG capsule Take 150 mg by mouth 2 (two) times daily.  5  . furosemide (LASIX) 40 MG tablet  TAKE 1 AND 1/2 TABLETS (60 MG TOTAL) BY MOUTH DAILY. 90 tablet 3  . glucose blood (ACCU-CHEK AVIVA PLUS) test strip Use to test blood glucose 3 times daily. Dx: E13.9 100 each 12  . hydrALAZINE (APRESOLINE) 10 MG tablet Take 1 tablet (10 mg total) by mouth 2 (two) times daily. 60 tablet 1  . Insulin Isophane & Regular Human (HUMULIN 70/30 MIX) (70-30) 100 UNIT/ML PEN Inject subcutaneously 18 units in the morning and 6 units in the evening 15 mL 2  . Insulin Pen Needle (ULTRA-THIN II MINI PEN NEEDLE) 31G X 5 MM MISC Use to inject insulin twice a day. Dx code: E13.9 100 each 2  . metoprolol succinate (TOPROL-XL) 50 MG 24 hr tablet TAKE 1 TABLET BY MOUTH EVERY DAY WITH OR IMMEDIATELY FOLLOWING A MEAL 90 tablet 3  . Multiple Vitamin (MULITIVITAMIN WITH MINERALS) TABS Take 1 tablet by mouth daily.    . pravastatin (PRAVACHOL) 40 MG tablet TAKE ONE (1) TABLET EACH                EVENING 90 tablet 3  . feeding supplement (GLUCERNA SHAKE) LIQD Take 237 mLs by mouth 3 (three) times daily between meals. (Patient not taking: Reported on 11/27/2014) 24 Can 6   Active Ambulatory Problems    Diagnosis Date Noted  . Diabetes 1.5, managed as type 2 01/13/2006  . HYPERLIPIDEMIA 10/31/2006  .  Essential hypertension 01/13/2006  . Coronary atherosclerosis 01/13/2006  . CONGESTIVE HEART FAILURE 01/13/2006  . RENAL ARTERY STENOSIS 01/13/2006  . Peripheral vascular disease 01/13/2006  . GERD 12/28/2007  . KIDNEY DISEASE, CHRONIC, STAGE IV 01/13/2006  . Severe dementia 03/19/2011  . CVA (cerebrovascular accident) 03/19/2011  . Preventative health care 01/07/2012  . Cyst of skin 11/03/2014   Resolved Ambulatory Problems    Diagnosis Date Noted  . MYOCARDIAL INFARCTION, HX OF 01/13/2006  . TIREDNESS 11/08/2008  . Left arm weakness 08/13/2010  . Fatigue 03/19/2011  . Hypertensive urgency 05/24/2011  . Hip pain, right 06/18/2011  . Health care maintenance 01/07/2012  . Dementia 05/04/2012  . Back abscess  04/01/2013  . Cyst, dermoid, trunk 04/05/2013   Past Medical History  Diagnosis Date  . Hyperlipidemia   . Hypertension   . Diabetes mellitus   . Chronic kidney disease   . RAS (renal artery stenosis)   . Stroke   . Ischemic cardiomyopathy   . CAD (coronary artery disease)   . Cardiomyopathy, ischemic    Social History  Substance Use Topics  . Smoking status: Never Smoker   . Smokeless tobacco: Never Used  . Alcohol Use: No  family history includes Kidney disease in her brother.  Review of Systems  Constitutional: Negative for fever, chills, diaphoresis, activity change, appetite change, fatigue and unexpected weight change.  HENT: Negative for congestion, sore throat, rhinorrhea, sneezing, trouble swallowing and sinus pressure.  Eyes: Negative for photophobia and visual disturbance.  Respiratory: Negative for cough, chest tightness, shortness of breath, wheezing and stridor.  Cardiovascular: Negative for chest pain, palpitations and leg swelling.  Gastrointestinal: Negative for nausea, vomiting, abdominal pain, diarrhea, constipation, blood in stool, abdominal distention and anal bleeding.  Genitourinary: Negative for dysuria, hematuria, flank pain and difficulty urinating.  Musculoskeletal: Negative for myalgias, back pain, joint swelling, arthralgias and gait problem.  Skin: Negative for color change, pallor, rash and wound.  Neurological: Negative for dizziness, tremors, weakness and light-headedness.  Hematological: Negative for adenopathy. Does not bruise/bleed easily.   Physical exam: BP 152/71 mmHg  Pulse 77  Temp(Src) 98.8 F (37.1 C) (Axillary)  Resp 18  Ht 5\' 4"  (1.626 m)  Wt 121 lb 0.5 oz (54.9 kg)  BMI 20.76 kg/m2  SpO2 98% Physical Exam  Constitutional:  oriented to person,only. appears well-developed and well-nourished. No distress. Well cared for.  HENT: [place]/AT, PERRLA, no scleral icterus Mouth/Throat: Oropharynx is clear and dry. No oropharyngeal  exudate.  Cardiovascular: Normal rate, regular rhythm and normal heart sounds. Exam reveals no gallop and no friction rub.  No murmur heard.  Pulmonary/Chest: Effort normal and breath sounds normal. No respiratory distress.  has no wheezes.  Neck = supple, no nuchal rigidity Abdominal: Soft. Bowel sounds are normal.scaphoid Lymphadenopathy: no cervical adenopathy. No axillary adenopathy Neurological: alert and oriented to person,but only responds to questions posed by family Skin: Skin is warm and dry. No rash noted. No erythema.  Psychiatric: a normal mood and affect.  behavior is normal.   Imaging: cxr did not show any evidence of infiltrate  Assessment/plan: 81 yo F with significant dementia admitted for left flank pain and vomiting. Both of which have resolved spontanenously. Initial bladder retention now improved. Some hypertension noted during hospitalization This patient's plan of care was discussed with the house staff. Please see their note for complete details. I concur with their findings. Vomiting = appears resolved. Continue with prn zofran to use if needed  Dementia = appears back  to baseline per family, no need to change medication at this time. If she tolerates breafast, lunch, she is free to go home  Hypertension = continue on home regimen, may need to change if remains elevated. Home diary suggests she is adequantly controlled with sBP 140-150s.  Diabetes = will conintue with insulin sliding scale.    Carlyle Basques, MD 11/29/2014, 3:32 PM

## 2014-11-29 NOTE — Discharge Instructions (Signed)
We have scheduled an acute hospital follow up appointment in our clinic this Friday, September 23 at 3:15 with Dr. Tiburcio Pea.   You also have a regular check-up appointment with Dr. [name] that is scheduled for Thursday, September 29 at 3:45.    At the follow up appointment, they can decide whether or not it is necessary to return again on 9/29.  For now, you have both scheduled.  Please remember to pick up your prescription for Hydralazine at the pharmacy.  We have increased her dose from 10mg  twice daily to 25mg  twice daily.  We will also send you home with the Do Not Resuscitate (DNR) paperwork.  Once your family has made a decision on the goals of care, please bring to your follow up visits for your physician to sign.

## 2014-11-29 NOTE — Progress Notes (Signed)
PT Cancellation Note  Patient Details Name: [name] MRN: [id number] DOB: 23-Aug-1931   Cancelled Treatment:    Reason Eval/Treat Not Completed: PT screened, no needs identified, will sign off (pt seen walking in hall with OT [facility] this am 1010 as well as family. Per OT no P.T. needs at this time with pt to return home with family. Will sign off. )   Lanetta Inch Beth 11/29/2014, 12:04 PM Elwyn Reach, [place]

## 2014-11-29 NOTE — Evaluation (Signed)
Occupational Therapy Evaluation and Discharge Patient Details Name: [name] MRN: [id number] DOB: 06/30/1931 Today's Date: 11/29/2014    History of Present Illness [name] is an 81 yo woman with a PMH of CKD Stage IV, severe left renal artery stenosis with failed stent and refractory HTN, ischemic cardiomyopathy, diabetes mellitus, and severe dementia who presents with left inferior rib cage and flank pain.   Clinical Impression   This 81 yo female admitted with above presents to acute OT at near baseline for mobility per grand-daughter other than pain in left flank. She will more than likely get back to baseline quickly once she returns home (due to her dementia) as long as flank pain is controlled. Per family in room, someone is always with pt at home--she is never left alone. No further OT needs, and did not see any PT needs based on how pt moved with me and her grand-daughter. We will sign off.    Follow Up Recommendations  No OT follow up;Supervision/Assistance - 24 hour    Equipment Recommendations  None recommended by OT       Precautions / Restrictions Precautions Precautions: Fall Restrictions Weight Bearing Restrictions: No      Mobility Bed Mobility Overal bed mobility: Needs Assistance Bed Mobility: Supine to Sit     Supine to sit: Min assist     General bed mobility comments: Pt wanting me to hold onto her hands and let her pull up--which I did since this is one of the way she gets up at home per her grand-daughter--however at other times she does not need help at all.  Transfers Overall transfer level: Needs assistance Equipment used: 1 person hand held assist Transfers: Sit to/from Stand Sit to Stand: Min assist              Balance Overall balance assessment: Needs assistance Sitting-balance support: No upper extremity supported;Feet supported Sitting balance-Leahy Scale: Fair     Standing balance support: Single extremity  supported Standing balance-Leahy Scale:  (poor initially, but then progressed to fair)                              ADL                                         General ADL Comments: Per family she normally toilets herself and has been trying since admit to get up and toilet herself and having accidents. Family reports she normally dresses herself and has A for bathing (due to pt does not feel she needs to bath due to her dementia). I did get pt to wash her face after placing a washcloth in her hand, other than that not easy to get her to do activites in an unfamiliar environment and at a time that is not her normal routine. She did walk the unit with me and her grand-daughter with min A (grand-daughter who helps take care of her, but is not her primary caregiver reports that she always holds her grandmas hand anytime she is up with her) as well as pt went up and down 3 steps with min A  and holding onto rail (which his her norm at home as well).     Vision Additional Comments: No change from baseline          Pertinent Vitals/Pain  Pain Assessment: Faces Faces Pain Scale: Hurts even more Pain Location: left flank (says it hurts really bad) Pain Descriptors / Indicators: Sore;Aching Pain Intervention(s): Monitored during session;Repositioned;Patient requesting pain meds-RN notified     Hand Dominance Right   Extremity/Trunk Assessment Upper Extremity Assessment Upper Extremity Assessment: Overall WFL for tasks assessed   Lower Extremity Assessment Lower Extremity Assessment: Overall WFL for tasks assessed       Communication Communication Communication: No difficulties   Cognition Arousal/Alertness: Awake/alert Behavior During Therapy: WFL for tasks assessed/performed Overall Cognitive Status: History of cognitive impairments - at baseline (severe dementia per chart)                                Home Living Family/patient expects to  be discharged to:: Private residence Living Arrangements: Children Available Help at Discharge: Family;Available 24 hours/day Type of Home: House Home Access: Stairs to enter [employer] of Steps: 4 with one rail   Home Layout: One level     Bathroom Shower/Tub: Teacher, early years/pre: Standard     Home Equipment: Shower seat (will not use it)          Prior Functioning/Environment Level of Independence: Needs assistance  Gait / Transfers Assistance Needed: S with occassional A to get started as well as for stairs ADL's / Homemaking Assistance Needed: Usually dresses and toilets herself; family A with bath due to pt does not feel she needs to bathe due to her dementia        OT Diagnosis: Generalized weakness;Cognitive deficits         OT Goals(Current goals can be found in the care plan section) Acute Rehab OT Goals Patient Stated Goal: family: to take her home  OT Frequency:                End of Session Nurse Communication:  (pt having left flank pain, wants meds ,and grand-daughter wants MDs to know she is having pain again now)  Activity Tolerance: Patient tolerated treatment well Patient left: in bed;with call bell/phone within reach;with family/visitor present   Time: 5366-4403 OT Time Calculation (min): 19 min Charges:  OT General Charges $OT Visit: 1 Procedure OT Evaluation $Initial OT Evaluation Tier I: 1 Procedure G-Codes: OT G-codes **NOT FOR INPATIENT CLASS** Functional Assessment Tool Used: Clinical observation Functional Limitation: Self care Self Care Current Status (K7425): At least 1 percent but less than 20 percent impaired, limited or restricted Self Care Goal Status (Z5638): At least 1 percent but less than 20 percent impaired, limited or restricted Self Care Discharge Status [id number]): At least 1 percent but less than 20 percent impaired, limited or restricted  Almon Register [phone] 11/29/2014, 12:24  PM

## 2014-11-29 NOTE — Progress Notes (Signed)
Subjective: Patient sleeping in bed when seen on rounds this morning.  Was given Haldol 2.5mg  once last night for agitation.  Her granddaughter and son are present in the room.  They state that she has been in her normal state of health up until this hospitalization.  No current complaints.  They state that they will be able to provide supervision and assistance to patient at discharge.  PT and OT evaluated with no follow-up needed other than supervision which family states they can provide.  Objective: Vital signs in last 24 hours: Filed Vitals:   11/28/14 1718 11/28/14 2140 11/29/14 0452 11/29/14 1200  BP: 186/95 162/126 182/93 152/71  Pulse: 86 87 88 77  Temp: 97.9 F (36.6 C) 98.5 F (36.9 C) 98.4 F (36.9 C) 98.8 F (37.1 C)  TempSrc: Oral Oral Oral Axillary  Resp: 20 20 20 18   Height:      Weight:   121 lb 0.5 oz (54.9 kg)   SpO2: 97% 97% 99% 98%   Weight change:   Intake/Output Summary (Last 24 hours) at 11/29/14 1305 Last data filed at 11/29/14 0600  Gross per 24 hour  Intake    657 ml  Output    550 ml  Net    107 ml   General: sleeping in be, pleasantly demented, NAD. HEENT: Moist mucus membranes Neck: Full range of motion without pain Lungs: Clear to ascultation bilaterally, normal work of respiration, no wheezes, rales, rhonchi Heart: RRR, no murmurs, gallops, or rubs Extremities: No cyanosis, clubbing, or edema  Lab Results: Basic Metabolic Panel:  Recent Labs Lab 11/27/14 1437 11/29/14 0455  NA 134* 132*  K 3.5 4.1  CL 101 100*  CO2 23 20*  GLUCOSE 94 214*  BUN 42* 38*  CREATININE 2.93* 3.02*  CALCIUM 9.2 9.3   Liver Function Tests:  Recent Labs Lab 11/27/14 1437  AST 27  ALT 18  ALKPHOS 97  BILITOT 0.6  PROT 7.7  ALBUMIN 4.1    Recent Labs Lab 11/27/14 1437  LIPASE 28   CBC:  Recent Labs Lab 11/27/14 1437 11/29/14 0455  WBC 6.5 6.4  HGB 10.1* 13.8  HCT 29.4* 40.7  MCV 91.0 92.9  PLT 343 241   CBG:  Recent  Labs Lab 11/28/14 0926 11/28/14 1509 11/28/14 1640 11/28/14 2233 11/29/14 0614 11/29/14 1140  GLUCAP 140* 203* 285* 146* 193* 198*   Urinalysis:  Recent Labs Lab 11/27/14 1530  COLORURINE YELLOW  LABSPEC 1.008  PHURINE 7.0  GLUCOSEU NEGATIVE  HGBUR NEGATIVE  BILIRUBINUR NEGATIVE  KETONESUR NEGATIVE  PROTEINUR 100*  UROBILINOGEN 0.2  NITRITE NEGATIVE  LEUKOCYTESUR NEGATIVE    Micro Results: No results found for this or any previous visit (from the past 240 hour(s)). Studies/Results: Dg Abd Acute W/chest  11/27/2014   CLINICAL DATA:  Chest and left flank pain since this morning. Vomiting.  EXAM: DG ABDOMEN ACUTE W/ 1V CHEST  COMPARISON:  Chest x-ray 07/17/2011  FINDINGS: The upright chest x-ray demonstrates mild stable cardiac enlargement and tortuosity and calcification of the thoracic aorta. No acute pulmonary findings. No pleural effusion.  Two views of the abdomen demonstrate a normal bowel gas pattern. No findings for obstruction an or perforation. The soft tissue shadows are maintained. No worrisome calcifications. A left renal artery stent is noted. Moderate vascular calcifications. There are advanced hip joint degenerative changes, right much greater than left.  IMPRESSION: No acute cardiopulmonary findings.  No plain film findings for an acute abdominal process.  Electronically Signed   By: Marijo Sanes M.D.   On: 11/27/2014 15:52   Ct Renal Stone Study  11/27/2014   CLINICAL DATA:  Left flank pain  EXAM: CT ABDOMEN AND PELVIS WITHOUT CONTRAST  TECHNIQUE: Multidetector CT imaging of the abdomen and pelvis was performed following the standard protocol without IV contrast.  COMPARISON:  CT report 08/05/2002 no images available  FINDINGS: Lung bases are unremarkable. Sagittal images of the spine shows degenerative changes thoracolumbar spine. Extensive atherosclerotic calcifications of abdominal aorta, SMA, splenic artery and iliac arteries are noted. No aortic aneurysm.  The  left kidney is atrophic. Left renal artery stent is noted. Vascular calcifications are noted bilateral renal hilum. Unenhanced liver shows no biliary ductal dilatation. Unenhanced pancreas and spleen are unremarkable. Bilateral mild thickening of adrenal glands without definite mass. Small hiatal hernia.  Moderate stool throughout the colon. No pericecal inflammation. Few colonic diverticula are noted right colon. No evidence of acute diverticulitis. There is a distended urinary bladder. The uterus is atrophic. No adnexal mass. Pelvic phleboliths are noted. No calcified calculi are noted within urinary bladder. Bilateral distal ureter is unremarkable.  No small bowel obstruction. No ascites or free air. No adenopathy. There is no inguinal adenopathy. Some colonic gas noted within rectum. Nonspecific thickening of anal wall. Clinical correlation is necessary.  IMPRESSION: 1. Extensive atherosclerotic vascular calcifications are noted. 2. No nephrolithiasis. No calcified ureteral calculi. There is atrophic left kidney. Left renal artery stent is noted. 3. Moderate stool noted throughout the colon. 4. Right colon diverticula.  No evidence of acute diverticulitis. 5. There is a distended urinary bladder. Bilateral distal ureter is unremarkable. Pelvic phleboliths are noted. 6. Atrophic uterus. 7. No small bowel obstruction.   Electronically Signed   By: Lahoma Crocker M.D.   On: 11/27/2014 17:11   Medications:  Scheduled Meds: . amLODipine  10 mg Oral Daily  . calcitRIOL  0.25 mcg Oral Daily  . cloNIDine  0.2 mg Transdermal Weekly  . clopidogrel  75 mg Oral Daily  . docusate sodium  100 mg Oral Daily  . enalapril  20 mg Oral BID  . feeding supplement (ENSURE ENLIVE)  237 mL Oral BID BM  . furosemide  60 mg Oral Daily  . heparin  5,000 Units Subcutaneous 3 times per day  . hydrALAZINE  25 mg Oral BID  . insulin aspart  0-5 Units Subcutaneous QHS  . insulin aspart  0-9 Units Subcutaneous TID WC  . insulin  aspart  3 Units Subcutaneous TID WC  . iron polysaccharides  150 mg Oral BID  . metoprolol succinate  50 mg Oral Daily  . multivitamin with minerals  1 tablet Oral Daily  . polyethylene glycol  17 g Oral Daily  . pravastatin  40 mg Oral Daily  . sodium chloride  3 mL Intravenous Q12H   Continuous Infusions:  PRN Meds:.sodium chloride, acetaminophen **OR** acetaminophen, ondansetron **OR** ondansetron (ZOFRAN) IV, sodium chloride Assessment/Plan: Principal Problem:   Left flank pain Active Problems:   Diabetes 1.5, managed as type 2   Essential hypertension   Coronary atherosclerosis   RENAL ARTERY STENOSIS   Peripheral vascular disease   KIDNEY DISEASE, CHRONIC, STAGE IV   Severe dementia   Hypertensive urgency   Nausea and vomiting   Anemia in CKD (chronic kidney disease)  Hypertensive Urgency Secondary to Renal Artery Stenosis: Her baseline creatinine has been progressively worsening over the past few years. Previous office visit 147/101 on 11/03/14 with no changes to  meds.  Visit prior to that was 200/109.  Based on chart review, she appears to chronically be in the 102-725 systolic range. - Hydralazine 10 mg po BID >> increased to 25mg  BID.  BP this afternoon is 152/71.  Will continue at 25mg  BID at discharge. - Enalapril 20 mg BID - Clonidine patch (Catapres) 0.2 mg weekly - Metoprolol 50 mg daily (24 hr tablet) - Amlodipine 10 mg daily -Lasix 60mg  PO daily -will keep other meds as is.  Flank Pain: No evidence of pyelonephritis or renal infarction on CT.    -Pain appears to have resolved with no current signs of pain on exam or per the patient -Zofran 4mg  q6h prn  Acute on Chronic Kidney Disease (Stage IV): Creatinine has increased steadily over past year (2.18 on 01/03/14, 2.68 05/2014, 2.93 today). The patient could be volume down due to episodes of vomiting; however, it is unclear if this is a true AKI or worsening CKD in setting of progressing RAS.  -creatinine 3.02  today >> will obtain BMP at outpatient follow up.  Type 1.5 Diabetes: On Humulin (70/30) at home 18U AM, and 6U in evening -Sliding scale insulin - sensitive tid with meals plus HS coverage -Novolog 3 units tid w/ meals -will discharge on home meds  Coronary Artery Disease with Ischemic Cardiomyopathy: Previous MI with ischemic cardiomyopathy (Echo in 2013 showed EF 50-55 with Grade II dCHF). No evidence of volume overload. - Continue home clopidogrel 75 mg - Continue pravastatin 40 mg daily - Continue Lasix 60mg  daily  Secondary Hyperparathyroidism: Calcium normal at 9.2.  - Continue home Calcitriol 0.25 mcg daily  Anemia: HgB stable at 10.1 (10.8 on 05/2014) - Continue to monitor - Continue home ferrex 150 mg BID  Constipation: -Colace 100mg  daily -Miralax daily  Dementia: Stable -PT and OT evaluated patient this morning.  They do not have any follow up recommendations other than 24 hr supervision/assistance which family states they will be able to provide.  Dispo: Discharge home today with family.  Will arrange outpatient follow up in the [facility].  The patient does have a current PCP Milagros Loll, MD) and does need an [facility] hospital follow-up appointment after discharge.  The patient does have transportation limitations that hinder transportation to clinic appointments.    LOS: 2 days   Jule Ser, DO 11/29/2014, 1:05 PM

## 2014-11-29 NOTE — Discharge Summary (Signed)
Name: [name] MRN: [id number] DOB: December 01, 1931 81 y.o. PCP: Milagros Loll, MD  Date of Admission: 11/27/2014  2:04 PM Date of Discharge: 11/29/2014 Attending Physician: No att. providers found  Discharge Diagnosis: Principal Problem:   Left flank pain Active Problems:   Diabetes 1.5, managed as type 2   Essential hypertension   Coronary atherosclerosis   RENAL ARTERY STENOSIS   Peripheral vascular disease   KIDNEY DISEASE, CHRONIC, STAGE IV   Severe dementia   Hypertensive urgency   Nausea and vomiting   Anemia in CKD (chronic kidney disease)  Discharge Medications:   Medication List    TAKE these medications        amLODipine 10 MG tablet  Commonly known as:  NORVASC  TAKE ONE (1) TABLET EACH DAY     BD INSULIN SYRINGE ULTRAFINE 31G X 5/16" 0.3 ML Misc  Generic drug:  Insulin Syringe-Needle U-100  1 SYRINGE BY DOES NOT APPLY ROUTE 2 (TWO) TIMES DAILY.     calcitRIOL 0.25 MCG capsule  Commonly known as:  ROCALTROL  Take 1 capsule (0.25 mcg total) by mouth daily.     cloNIDine 0.2 mg/24hr patch  Commonly known as:  CATAPRES - Dosed in mg/24 hr  Place 1 patch (0.2 mg total) onto the skin once a week.     clopidogrel 75 MG tablet  Commonly known as:  PLAVIX  Take 1 tablet (75 mg total) by mouth daily.     enalapril 20 MG tablet  Commonly known as:  VASOTEC  TAKE 2 TABLETS (40 MG TOTAL) BY MOUTH DAILY.     feeding supplement (GLUCERNA SHAKE) Liqd  Take 237 mLs by mouth 3 (three) times daily between meals.     FERREX 150 150 MG capsule  Generic drug:  iron polysaccharides  Take 150 mg by mouth 2 (two) times daily.     furosemide 40 MG tablet  Commonly known as:  LASIX  TAKE 1 AND 1/2 TABLETS (60 MG TOTAL) BY MOUTH DAILY.     glucose blood test strip  Commonly known as:  ACCU-CHEK AVIVA PLUS  Use to test blood glucose 3 times daily. Dx: E13.9     hydrALAZINE 25 MG tablet  Commonly known as:  APRESOLINE  Take 1 tablet (25 mg total) by mouth 2  (two) times daily.     Insulin Isophane & Regular Human (70-30) 100 UNIT/ML PEN  Commonly known as:  HUMULIN 70/30 MIX  Inject subcutaneously 18 units in the morning and 6 units in the evening     Insulin Pen Needle 31G X 5 MM Misc  Commonly known as:  ULTRA-THIN II MINI PEN NEEDLE  Use to inject insulin twice a day. Dx code: E13.9     metoprolol succinate 50 MG 24 hr tablet  Commonly known as:  TOPROL-XL  TAKE 1 TABLET BY MOUTH EVERY DAY WITH OR IMMEDIATELY FOLLOWING A MEAL     multivitamin with minerals Tabs tablet  Take 1 tablet by mouth daily.     pravastatin 40 MG tablet  Commonly known as:  PRAVACHOL  TAKE ONE (1) TABLET EACH                EVENING        Disposition and follow-up:   [name] was discharged from [facility] in Stable condition.  At the hospital follow up visit please address:  1.  Patients adherence to change in BP medicine regimen.  We increased  her Hydralazine from 10mg  bid to 25mg  bid.  All other meds stayed the same.  Please address that she is taking her meds as prescribed and consistently at the same time.  Also, while inpatient we began to discuss [place] with family and possible code status.  If appropriate, inquire as to any discussions family has had since discharge.   2.  Labs / imaging needed at time of follow-up: BMP  3.  Pending labs/ test needing follow-up: none  Follow-up Appointments: Follow-up Information    Follow up with Burgess Estelle, MD On 12/02/2014.   Specialty:  Internal Medicine   Why:  Appointment time is at 3:15pm   Contact information:   [place] [address] [phone]       Follow up with [name], MD On 12/08/2014.   Specialty:  Internal Medicine   Why:  Appointment time is at 3:45pm   Contact information:   [place] [address] [phone]       Discharge Instructions:   Consultations:    Procedures Performed:  Dg Abd Acute  W/chest  11/27/2014   CLINICAL DATA:  Chest and left flank pain since this morning. Vomiting.  EXAM: DG ABDOMEN ACUTE W/ 1V CHEST  COMPARISON:  Chest x-ray 07/17/2011  FINDINGS: The upright chest x-ray demonstrates mild stable cardiac enlargement and tortuosity and calcification of the thoracic aorta. No acute pulmonary findings. No pleural effusion.  Two views of the abdomen demonstrate a normal bowel gas pattern. No findings for obstruction an or perforation. The soft tissue shadows are maintained. No worrisome calcifications. A left renal artery stent is noted. Moderate vascular calcifications. There are advanced hip joint degenerative changes, right much greater than left.  IMPRESSION: No acute cardiopulmonary findings.  No plain film findings for an acute abdominal process.   Electronically Signed   By: Marijo Sanes M.D.   On: 11/27/2014 15:52   Ct Renal Stone Study  11/27/2014   CLINICAL DATA:  Left flank pain  EXAM: CT ABDOMEN AND PELVIS WITHOUT CONTRAST  TECHNIQUE: Multidetector CT imaging of the abdomen and pelvis was performed following the standard protocol without IV contrast.  COMPARISON:  CT report 08/05/2002 no images available  FINDINGS: Lung bases are unremarkable. Sagittal images of the spine shows degenerative changes thoracolumbar spine. Extensive atherosclerotic calcifications of abdominal aorta, SMA, splenic artery and iliac arteries are noted. No aortic aneurysm.  The left kidney is atrophic. Left renal artery stent is noted. Vascular calcifications are noted bilateral renal hilum. Unenhanced liver shows no biliary ductal dilatation. Unenhanced pancreas and spleen are unremarkable. Bilateral mild thickening of adrenal glands without definite mass. Small hiatal hernia.  Moderate stool throughout the colon. No pericecal inflammation. Few colonic diverticula are noted right colon. No evidence of acute diverticulitis. There is a distended urinary bladder. The uterus is atrophic. No adnexal  mass. Pelvic phleboliths are noted. No calcified calculi are noted within urinary bladder. Bilateral distal ureter is unremarkable.  No small bowel obstruction. No ascites or free air. No adenopathy. There is no inguinal adenopathy. Some colonic gas noted within rectum. Nonspecific thickening of anal wall. Clinical correlation is necessary.  IMPRESSION: 1. Extensive atherosclerotic vascular calcifications are noted. 2. No nephrolithiasis. No calcified ureteral calculi. There is atrophic left kidney. Left renal artery stent is noted. 3. Moderate stool noted throughout the colon. 4. Right colon diverticula.  No evidence of acute diverticulitis. 5. There is a distended urinary bladder. Bilateral distal ureter is unremarkable. Pelvic  phleboliths are noted. 6. Atrophic uterus. 7. No small bowel obstruction.   Electronically Signed   By: Lahoma Crocker M.D.   On: 11/27/2014 17:11    2D Echo: none  Cardiac Cath: none  Admission HPI: [name] is an 81 yo woman with a PMH of CKD Stage IV, severe left renal artery stenosis with failed stent and refractory HTN, ischemic cardiomyopathy, diabetes mellitus, and severe dementia who presents with left inferior rib cage and flank pain. That patient's daughter reported that the pain had started yesterday morning at her back and has been worsening since. The daughter applied some lotion that was ineffective. The patient then had four episodes of non-bloody, non-bilious vomiting and called 911.   The patient is followed by [facility], who manage her RAS and have concluded that her right stent (placed in 2004) is not amenable to re-intervention. Previous notes from [facility] suggest there have been issues with adherence in the past with regard to her blood pressure medication, and that it is never clear which medications she is taking or not taking. A NM perfusion scan from 2015 showed significantly diminished perfusion of left kidney, with renal function  coming 90% from R kidney, and 10% from L kidney.  In the ED, her creatinine was noted to be 2.93, above her baseline of 2.68. CT scan demonstrated no nephrolithiasis or ureteral calculi, but did show a distended bladder. Bladder scan showed that she was retaining 500 mLs post-voiding, and a foley was placed. She was given a 1L bolus of NS. Her BP was 223/89, and she was given 10 mg hydrazlazine IV.   Hospital Course by problem list: Principal Problem:   Left flank pain Active Problems:   Diabetes 1.5, managed as type 2   Essential hypertension   Coronary atherosclerosis   RENAL ARTERY STENOSIS   Peripheral vascular disease   KIDNEY DISEASE, CHRONIC, STAGE IV   Severe dementia   Hypertensive urgency   Nausea and vomiting   Anemia in CKD (chronic kidney disease)   1. Hypertensive urgency secondary to renal artery stenosis: baseline creatine has been worsening over the past few years with previous notes suggesting that non-adherence to medications with her BP's of 130-200s while asymptomatic.  We continued all of her home BP medications and had PRN Hydralazine 10mg  IV for pressures greater than 220/110.  This was only required x 1 while patient was in the ED.  Otherwise her pressures remained stable at a level where patient has chronically been according to chart review.  In regards to her home meds, we did increase her PO hydralazine to 25mg  bid from 10mg  bid.  This change was continued at discharge and patient was given a prescription for this.  2.  Acute on chronic kidney disease (Stage IV): creatine steadily increasing over the past year from 2.18 (October 2015) to 2.68 (March 2016).  Was 2.93 on admission and increased slightly to 3.0 the next day.  This was likely due to possible volume depletion from vomiting but could also be due to worsening CKD in the setting of progressing RAS.  Will check her BMP at follow up visit.  3. Flank pain: no evidence of pyelonephritis or renal infarction on  CT with pain having improved on all of our evaluations.  Patient denied any pain whenever we examined her.  4. Diabetes mellitus: we placed her on a sensitive sliding scale with HS coverage as well as providing Novolog 3 units tid with meals.  Her home diabetes meds were restarted at discharge.  5. Dementia: she became agitated overnight and was given 2.5mg  Haldol with resolution of her agitation.    6. CAD with ischemic cardiomyopathy: we continued her home meds.  7. Secondary hyperparathyroidism: we continued home meds.  8. Anemia: we continued home meds.  Discharge Vitals:   BP 152/71 mmHg  Pulse 77  Temp(Src) 98.8 F (37.1 C) (Axillary)  Resp 18  Ht 5\' 4"  (1.626 m)  Wt 121 lb 0.5 oz (54.9 kg)  BMI 20.76 kg/m2  SpO2 98%  Discharge Labs:  Results for orders placed or performed during the hospital encounter of 11/27/14 (from the past 24 hour(s))  Glucose, capillary     Status: Abnormal   Collection Time: 11/28/14 10:33 PM  Result Value Ref Range   Glucose-Capillary 146 (H) 65 - 99 mg/dL  Basic metabolic panel     Status: Abnormal   Collection Time: 11/29/14  4:55 AM  Result Value Ref Range   Sodium 132 (L) 135 - 145 mmol/L   Potassium 4.1 3.5 - 5.1 mmol/L   Chloride 100 (L) 101 - 111 mmol/L   CO2 20 (L) 22 - 32 mmol/L   Glucose, Bld 214 (H) 65 - 99 mg/dL   BUN 38 (H) 6 - 20 mg/dL   Creatinine, Ser 3.02 (H) 0.44 - 1.00 mg/dL   Calcium 9.3 8.9 - 10.3 mg/dL   GFR calc non Af Amer 14 (L) >60 mL/min   GFR calc Af Amer 16 (L) >60 mL/min   Anion gap 12 5 - 15  CBC     Status: Abnormal   Collection Time: 11/29/14  4:55 AM  Result Value Ref Range   WBC 6.4 4.0 - 10.5 K/uL   RBC 4.38 3.87 - 5.11 MIL/uL   Hemoglobin 13.8 12.0 - 15.0 g/dL   HCT 40.7 36.0 - 46.0 %   MCV 92.9 78.0 - 100.0 fL   MCH 31.5 26.0 - 34.0 pg   MCHC 33.9 30.0 - 36.0 g/dL   RDW 11.3 (L) 11.5 - 15.5 %   Platelets 241 150 - 400 K/uL  Glucose, capillary     Status: Abnormal   Collection Time: 11/29/14   6:14 AM  Result Value Ref Range   Glucose-Capillary 193 (H) 65 - 99 mg/dL  Glucose, capillary     Status: Abnormal   Collection Time: 11/29/14 11:40 AM  Result Value Ref Range   Glucose-Capillary 198 (H) 65 - 99 mg/dL    Signed: Jule Ser, DO 11/29/2014, 8:48 PM    Services Ordered on Discharge: none Equipment Ordered on Discharge: none

## 2014-12-01 ENCOUNTER — Telehealth: Payer: Self-pay | Admitting: Internal Medicine

## 2014-12-01 NOTE — Telephone Encounter (Addendum)
Called to pt to confirm appt for 12/02/14 at 3:15 lmtcb

## 2014-12-02 ENCOUNTER — Encounter: Payer: Self-pay | Admitting: Internal Medicine

## 2014-12-02 ENCOUNTER — Ambulatory Visit (INDEPENDENT_AMBULATORY_CARE_PROVIDER_SITE_OTHER): Payer: Medicare Other | Admitting: Internal Medicine

## 2014-12-02 VITALS — BP 151/90 | HR 73 | Temp 97.5°F | Wt 133.3 lb

## 2014-12-02 DIAGNOSIS — I701 Atherosclerosis of renal artery: Secondary | ICD-10-CM

## 2014-12-02 DIAGNOSIS — Z79899 Other long term (current) drug therapy: Secondary | ICD-10-CM

## 2014-12-02 DIAGNOSIS — I10 Essential (primary) hypertension: Secondary | ICD-10-CM | POA: Diagnosis not present

## 2014-12-02 DIAGNOSIS — Z8673 Personal history of transient ischemic attack (TIA), and cerebral infarction without residual deficits: Secondary | ICD-10-CM | POA: Diagnosis not present

## 2014-12-02 NOTE — Patient Instructions (Signed)
Thanks for your visit today Please continue your hydralazine 25 mg twice a day,and all of your other medicines at the prescribed dose Please follow up in 2 weeks I will call you if the results are concerning

## 2014-12-02 NOTE — Assessment & Plan Note (Signed)
BP Readings from Last 3 Encounters:  12/02/14 151/90  11/29/14 152/71  11/03/14 147/101   Pt is doing well after being discharged from the hospital. She moved in with her daughter who manage her meds now. At discharge her hydralazine was increase from 10 mg to 25 mg. However the daughter by mistake was giving her the previous 10 mg dose. I explained to her to take the 25 mg medicine.  Her BP today was slightly elevated, though looking at the trend, it seems to be improved. With her, the goal is 130/80 due to the history of CVA  Continue hydralazine 25 mg bid, amlodiping 10 mg, clonidine 0.2 mg patch, enalapril 40 mg, lasix 60 mg, and metoprolol 50 mg  F/u in 2 weeks with PCP

## 2014-12-02 NOTE — Progress Notes (Signed)
Patient ID: [name], female   DOB: 10/22/1931, 81 y.o.   MRN: [id number]    Subjective:   Patient ID: [name] female   DOB: 1931-05-13 81 y.o.   MRN: [id number]  HPI: [name] is a 81 y.o. woman with CKD5, Left renal artery stenosis, HTN, history of CVA, dementia, who is here for hospital follow up after hypertensive urgency.    Past Medical History  Diagnosis Date  . Hyperlipidemia   . Hypertension   . Diabetes mellitus   . Chronic kidney disease     stage IV  . RAS (renal artery stenosis)   . Stroke     s/p CVA unknown timing  . Ischemic cardiomyopathy     EF 20%  . Dementia   . CAD (coronary artery disease)     s/p PTCA May 2005, stenting of an intermittent blanch of the CMX and the angioplasty of teh diagonal branch to the left descending  . Cardiomyopathy, ischemic    Current Outpatient Prescriptions  Medication Sig Dispense Refill  . amLODipine (NORVASC) 10 MG tablet TAKE ONE (1) TABLET EACH DAY 90 tablet 3  . BD INSULIN SYRINGE ULTRAFINE 31G X 5/16" 0.3 ML MISC 1 SYRINGE BY DOES NOT APPLY ROUTE 2 (TWO) TIMES DAILY. 60 each 3  . calcitRIOL (ROCALTROL) 0.25 MCG capsule Take 1 capsule (0.25 mcg total) by mouth daily. 90 capsule 3  . cloNIDine (CATAPRES - DOSED IN MG/24 HR) 0.2 mg/24hr patch Place 1 patch (0.2 mg total) onto the skin once a week. 12 patch 3  . clopidogrel (PLAVIX) 75 MG tablet Take 1 tablet (75 mg total) by mouth daily. 90 tablet 4  . enalapril (VASOTEC) 20 MG tablet TAKE 2 TABLETS (40 MG TOTAL) BY MOUTH DAILY. 60 tablet 3  . feeding supplement (GLUCERNA SHAKE) LIQD Take 237 mLs by mouth 3 (three) times daily between meals. 24 Can 6  . FERREX 150 150 MG capsule Take 150 mg by mouth 2 (two) times daily.  5  . furosemide (LASIX) 40 MG tablet TAKE 1 AND 1/2 TABLETS (60 MG TOTAL) BY MOUTH DAILY. 90 tablet 3  . glucose blood (ACCU-CHEK AVIVA PLUS) test strip Use to test blood glucose 3 times daily. Dx: E13.9 100 each 12  . hydrALAZINE  (APRESOLINE) 25 MG tablet Take 1 tablet (25 mg total) by mouth 2 (two) times daily. 60 tablet 1  . Insulin Isophane & Regular Human (HUMULIN 70/30 MIX) (70-30) 100 UNIT/ML PEN Inject subcutaneously 18 units in the morning and 6 units in the evening 15 mL 2  . Insulin Pen Needle (ULTRA-THIN II MINI PEN NEEDLE) 31G X 5 MM MISC Use to inject insulin twice a day. Dx code: E13.9 100 each 2  . metoprolol succinate (TOPROL-XL) 50 MG 24 hr tablet TAKE 1 TABLET BY MOUTH EVERY DAY WITH OR IMMEDIATELY FOLLOWING A MEAL 90 tablet 3  . Multiple Vitamin (MULITIVITAMIN WITH MINERALS) TABS Take 1 tablet by mouth daily.    . pravastatin (PRAVACHOL) 40 MG tablet TAKE ONE (1) TABLET EACH                EVENING 90 tablet 3   No current facility-administered medications for this visit.   Family History  Problem Relation Age of Onset  . Kidney disease Brother    Social History   Social History  . Marital Status: Married    Spouse Name: N/A  . Number of Children: N/A  . Years of Education: N/A  Social History Main Topics  . Smoking status: Never Smoker   . Smokeless tobacco: Never Used  . Alcohol Use: No  . Drug Use: No  . Sexual Activity: Not on file   Other Topics Concern  . Not on file   Social History Narrative   Patient gets regular exercise- walking   Lives with husband and son; son manages medications      No FH of colon cancer   Review of Systems: Review of Systems  Constitutional: Negative.  Negative for fever and weight loss.       Has dementia  HENT: Negative.   Respiratory: Negative for cough, shortness of breath and wheezing.   Cardiovascular: Negative for chest pain, palpitations, orthopnea, leg swelling and PND.  Gastrointestinal: Negative for nausea and vomiting.  Neurological: Negative for dizziness, focal weakness, seizures, loss of consciousness and weakness.    Objective:  Physical Exam: Filed Vitals:   12/02/14 1549  BP: 151/90  Pulse: 73  Temp: 97.5 F (36.4 C)    TempSrc: Oral  Weight: 133 lb 4.8 oz (60.464 kg)  SpO2: 100%   Physical Exam  Constitutional: She is oriented to person, place, and time. She appears well-developed and well-nourished.  In wheelchair accompanied by daughter and grand daughter who manage her medicines now,  HENT:  Head: Normocephalic and atraumatic.  Eyes: EOM are normal.  Neck: Normal range of motion. Neck supple.  Cardiovascular: Normal rate, regular rhythm, normal heart sounds and intact distal pulses.   No murmur heard. Pulmonary/Chest: Effort normal and breath sounds normal. No respiratory distress. She has no wheezes.  Abdominal: Soft. Bowel sounds are normal. She exhibits no distension. There is no tenderness.  Neurological: She is alert and oriented to person, place, and time.  Has dementia, oriented to time, place and date  Skin: Skin is warm.    Assessment & Plan:   Please see problem based charting for assessment and plan

## 2014-12-02 NOTE — Progress Notes (Signed)
Internal Medicine Clinic Attending  I saw and evaluated the patient.  I personally confirmed the key portions of the history and exam documented by Dr. Saraiya and I reviewed pertinent patient test results.  The assessment, diagnosis, and plan were formulated together and I agree with the documentation in the resident's note.  

## 2014-12-03 LAB — BMP8+ANION GAP
ANION GAP: 18 mmol/L (ref 10.0–18.0)
BUN/Creatinine Ratio: 15 (ref 11–26)
BUN: 48 mg/dL — ABNORMAL HIGH (ref 8–27)
CALCIUM: 8.6 mg/dL — AB (ref 8.7–10.3)
CO2: 21 mmol/L (ref 18–29)
Chloride: 88 mmol/L — ABNORMAL LOW (ref 97–108)
Creatinine, Ser: 3.2 mg/dL — ABNORMAL HIGH (ref 0.57–1.00)
GFR, EST AFRICAN AMERICAN: 15 mL/min/{1.73_m2} — AB (ref >59)
GFR, EST NON AFRICAN AMERICAN: 13 mL/min/{1.73_m2} — AB (ref >59)
Glucose: 180 mg/dL — ABNORMAL HIGH (ref 65–99)
Potassium: 4.2 mmol/L (ref 3.5–5.2)
Sodium: 127 mmol/L — ABNORMAL LOW (ref 134–144)

## 2014-12-05 ENCOUNTER — Encounter (HOSPITAL_COMMUNITY): Payer: Self-pay | Admitting: Family Medicine

## 2014-12-05 ENCOUNTER — Telehealth: Payer: Self-pay | Admitting: Internal Medicine

## 2014-12-05 ENCOUNTER — Emergency Department (HOSPITAL_COMMUNITY)
Admission: EM | Admit: 2014-12-05 | Discharge: 2014-12-05 | Disposition: A | Discharge status: Home / Self Care | Payer: Medicare Other | Attending: Emergency Medicine | Admitting: Emergency Medicine

## 2014-12-05 ENCOUNTER — Telehealth: Payer: Self-pay | Admitting: Pulmonary Disease

## 2014-12-05 DIAGNOSIS — Z7902 Long term (current) use of antithrombotics/antiplatelets: Secondary | ICD-10-CM | POA: Diagnosis not present

## 2014-12-05 DIAGNOSIS — F0390 Unspecified dementia without behavioral disturbance: Secondary | ICD-10-CM | POA: Insufficient documentation

## 2014-12-05 DIAGNOSIS — Z794 Long term (current) use of insulin: Secondary | ICD-10-CM | POA: Insufficient documentation

## 2014-12-05 DIAGNOSIS — Z8673 Personal history of transient ischemic attack (TIA), and cerebral infarction without residual deficits: Secondary | ICD-10-CM | POA: Diagnosis not present

## 2014-12-05 DIAGNOSIS — I2510 Atherosclerotic heart disease of native coronary artery without angina pectoris: Secondary | ICD-10-CM | POA: Diagnosis not present

## 2014-12-05 DIAGNOSIS — Z79899 Other long term (current) drug therapy: Secondary | ICD-10-CM | POA: Insufficient documentation

## 2014-12-05 DIAGNOSIS — I129 Hypertensive chronic kidney disease with stage 1 through stage 4 chronic kidney disease, or unspecified chronic kidney disease: Secondary | ICD-10-CM | POA: Diagnosis not present

## 2014-12-05 DIAGNOSIS — N189 Chronic kidney disease, unspecified: Secondary | ICD-10-CM | POA: Diagnosis not present

## 2014-12-05 DIAGNOSIS — R7989 Other specified abnormal findings of blood chemistry: Secondary | ICD-10-CM | POA: Insufficient documentation

## 2014-12-05 DIAGNOSIS — E119 Type 2 diabetes mellitus without complications: Secondary | ICD-10-CM | POA: Diagnosis not present

## 2014-12-05 DIAGNOSIS — E871 Hypo-osmolality and hyponatremia: Secondary | ICD-10-CM

## 2014-12-05 LAB — CBC
HCT: 29.9 % — ABNORMAL LOW (ref 36.0–46.0)
Hemoglobin: 10 g/dL — ABNORMAL LOW (ref 12.0–15.0)
MCH: 31.1 pg (ref 26.0–34.0)
MCHC: 33.4 g/dL (ref 30.0–36.0)
MCV: 92.9 fL (ref 78.0–100.0)
PLATELETS: 318 10*3/uL (ref 150–400)
RBC: 3.22 MIL/uL — ABNORMAL LOW (ref 3.87–5.11)
RDW: 11.2 % — AB (ref 11.5–15.5)
WBC: 7.8 10*3/uL (ref 4.0–10.5)

## 2014-12-05 LAB — COMPREHENSIVE METABOLIC PANEL
ALK PHOS: 110 U/L (ref 38–126)
ALT: 15 U/L (ref 14–54)
AST: 20 U/L (ref 15–41)
Albumin: 3.7 g/dL (ref 3.5–5.0)
Anion gap: 12 (ref 5–15)
BUN: 49 mg/dL — AB (ref 6–20)
CALCIUM: 9.1 mg/dL (ref 8.9–10.3)
CHLORIDE: 101 mmol/L (ref 101–111)
CO2: 21 mmol/L — AB (ref 22–32)
CREATININE: 3.62 mg/dL — AB (ref 0.44–1.00)
GFR calc Af Amer: 13 mL/min — ABNORMAL LOW (ref >60)
GFR calc non Af Amer: 11 mL/min — ABNORMAL LOW (ref >60)
GLUCOSE: 177 mg/dL — AB (ref 65–99)
Potassium: 3.7 mmol/L (ref 3.5–5.1)
SODIUM: 134 mmol/L — AB (ref 135–145)
Total Bilirubin: 0.4 mg/dL (ref 0.3–1.2)
Total Protein: 7.6 g/dL (ref 6.5–8.1)

## 2014-12-05 NOTE — ED Provider Notes (Signed)
CSN: [id number]     Arrival date & time 12/05/14  1717 History   First MD Initiated Contact with Patient 12/05/14 1817     Chief Complaint  Patient presents with  . abnormal labs    HPI  [name] is an 81 year old female with PMHx of HTN, DM, CKD4 and dementia presenting after her PCP advised her to come to the ED for abnormal labs. She had a lab draw on Friday that showed sodium of 127 and creatinine of 3.2. She was recently hospitalized (9/18-9/20) for hypertensive urgency and worsening renal function with a creatinine at discharge of 3.02. Pt reports that she is not experiencing any symptoms at this time. Pt's family is at bedside and state that [name] is at her baseline and has not been complaining of anything since her recent hospital discharge. She follows with [facility].  Past Medical History  Diagnosis Date  . Hyperlipidemia   . Hypertension   . Diabetes mellitus   . Chronic kidney disease     stage IV  . RAS (renal artery stenosis)   . Stroke     s/p CVA unknown timing  . Ischemic cardiomyopathy     EF 20%  . Dementia   . CAD (coronary artery disease)     s/p PTCA May 2005, stenting of an intermittent blanch of the CMX and the angioplasty of teh diagonal branch to the left descending  . Cardiomyopathy, ischemic    Past Surgical History  Procedure Laterality Date  . Appendectomy    . Total abdominal hysterectomy w/ bilateral salpingoophorectomy    . Angioplasty      and stent   Family History  Problem Relation Age of Onset  . Kidney disease Brother    Social History  Substance Use Topics  . Smoking status: Never Smoker   . Smokeless tobacco: Never Used  . Alcohol Use: No   OB History    No data available     Review of Systems  Constitutional: Negative for fever and chills.  HENT: Negative for congestion, rhinorrhea and sore throat.   Respiratory: Negative for shortness of breath.   Cardiovascular: Negative for chest pain and leg  swelling.  Gastrointestinal: Negative for nausea, vomiting, abdominal pain and abdominal distention.  Genitourinary: Negative for dysuria, frequency and flank pain.  Musculoskeletal: Negative for back pain and neck pain.  Neurological: Negative for headaches.      Allergies  Review of patient's allergies indicates no known allergies.  Home Medications   Prior to Admission medications   Medication Sig Start Date End Date Taking? Authorizing Provider  amLODipine (NORVASC) 10 MG tablet TAKE ONE (1) TABLET EACH DAY 03/29/14  Yes Luan Moore, MD  calcitRIOL (ROCALTROL) 0.25 MCG capsule Take 1 capsule (0.25 mcg total) by mouth daily. 04/06/14  Yes Luan Moore, MD  cloNIDine (CATAPRES - DOSED IN MG/24 HR) 0.2 mg/24hr patch Place 1 patch (0.2 mg total) onto the skin once a week. Patient taking differently: Place 0.2 mg onto the skin every Saturday.  11/16/14  Yes Milagros Loll, MD  clopidogrel (PLAVIX) 75 MG tablet Take 1 tablet (75 mg total) by mouth daily. 08/04/14  Yes Milagros Loll, MD  feeding supplement (GLUCERNA SHAKE) LIQD Take 237 mLs by mouth 3 (three) times daily between meals. 01/06/12  Yes Dorian Heckle, MD  FERREX 150 150 MG capsule Take 150 mg by mouth 2 (two) times daily. 10/12/14  Yes Historical Provider, MD  furosemide (LASIX)  40 MG tablet TAKE 1 AND 1/2 TABLETS (60 MG TOTAL) BY MOUTH DAILY. 12/05/14  Yes Burgess Estelle, MD  hydrALAZINE (APRESOLINE) 25 MG tablet Take 1 tablet (25 mg total) by mouth 2 (two) times daily. 11/29/14  Yes Jule Ser, DO  Insulin Isophane & Regular Human (HUMULIN 70/30 MIX) (70-30) 100 UNIT/ML PEN Inject subcutaneously 18 units in the morning and 6 units in the evening 10/07/14  Yes Milagros Loll, MD  Multiple Vitamin (MULITIVITAMIN WITH MINERALS) TABS Take 1 tablet by mouth daily.   Yes Historical Provider, MD  pravastatin (PRAVACHOL) 40 MG tablet TAKE ONE (1) TABLET EACH                EVENING 11/16/14  Yes Milagros Loll, MD  BD INSULIN SYRINGE  ULTRAFINE 31G X 5/16" 0.3 ML MISC 1 SYRINGE BY DOES NOT APPLY ROUTE 2 (TWO) TIMES DAILY. 04/22/14   Luan Moore, MD  glucose blood (ACCU-CHEK AVIVA PLUS) test strip Use to test blood glucose 3 times daily. Dx: E13.9 07/12/14   Luan Moore, MD  Insulin Pen Needle (ULTRA-THIN II MINI PEN NEEDLE) 31G X 5 MM MISC Use to inject insulin twice a day. Dx code: E13.9 10/07/14   Milagros Loll, MD  metoprolol succinate (TOPROL-XL) 50 MG 24 hr tablet TAKE 1 TABLET BY MOUTH EVERY DAY WITH OR IMMEDIATELY FOLLOWING A MEAL 11/16/14   Milagros Loll, MD   BP 160/69 mmHg  Pulse 82  Temp(Src) 98.3 F (36.8 C) (Oral)  Resp 22  SpO2 100% Physical Exam  Constitutional: She appears well-developed and well-nourished. No distress.  HENT:  Head: Normocephalic and atraumatic.  Mouth/Throat: Oropharynx is clear and moist. No oropharyngeal exudate.  Eyes: Conjunctivae are normal. Pupils are equal, round, and reactive to light. Right eye exhibits no discharge. Left eye exhibits no discharge. No scleral icterus.  Neck: Normal range of motion.  Cardiovascular: Normal rate and regular rhythm.   No peripheral edema. Palpable pedal pulses b/l.  Pulmonary/Chest: Effort normal. No respiratory distress.  Abdominal: Soft. Bowel sounds are normal. She exhibits no distension. There is no tenderness.  Musculoskeletal: Normal range of motion.  Neurological: She is alert. Coordination normal.  Skin: Skin is warm and dry.  Psychiatric: She has a normal mood and affect. Her behavior is normal.  Nursing note and vitals reviewed.   ED Course  Procedures (including critical care time) Labs Review Labs Reviewed  COMPREHENSIVE METABOLIC PANEL - Abnormal; Notable for the following:    Sodium 134 (*)    CO2 21 (*)    Glucose, Bld 177 (*)    BUN 49 (*)    Creatinine, Ser 3.62 (*)    GFR calc non Af Amer 11 (*)    GFR calc Af Amer 13 (*)    All other components within normal limits  CBC - Abnormal; Notable for the following:     RBC 3.22 (*)    Hemoglobin 10.0 (*)    HCT 29.9 (*)    RDW 11.2 (*)    All other components within normal limits    Imaging Review No results found. I have personally reviewed and evaluated these images and lab results as part of my medical decision-making.   EKG Interpretation None      MDM   Final diagnoses:  Creatinine elevation  Chronic kidney disease, unspecified stage   Pt presenting with abnormal lab values at her most recent PCP visit. Pt's PCP called the family and told them to come  in to get another BMET. Pt with no complaints at this time. VSS. Sodium 134. Creatinine 3.62 which is elevated from 3.2. Creatinine appears to have been increasing over past year. Consulted nephrology who recommended we discontinue her enalapril and follow up with her kidney doctor later this week. Discussed this plan with pt and her family who express understanding. Return precautions given in discharge paperwork and discussed with pt at bedside. Pt stable for discharge   Josephina Gip, PA-C 12/06/14 0004  Nat Christen, MD 12/06/14 [phone]

## 2014-12-05 NOTE — Telephone Encounter (Signed)
Pt daughter called requesting to get sleeping medicine for mother.

## 2014-12-05 NOTE — Discharge Instructions (Signed)
-STOP TAKING ENALAPRIL (VASOTEC) - [facility] will call to schedule and appointment. If you do not hear from them in 2 days, call to schedule a follow up appointment with your kidney doctor     Chronic Kidney Disease Chronic kidney disease occurs when the kidneys are damaged over a long period. The kidneys are two organs that lie on either side of the spine between the middle of the back and the front of the abdomen. The kidneys:   Remove wastes and extra water from the blood.   Produce important hormones. These help keep bones strong, regulate blood pressure, and help create red blood cells.   Balance the fluids and chemicals in the blood and tissues. A small amount of kidney damage may not cause problems, but a large amount of damage may make it difficult or impossible for the kidneys to work the way they should. If steps are not taken to slow down the kidney damage or stop it from getting worse, the kidneys may stop working permanently. Most of the time, chronic kidney disease does not go away. However, it can often be controlled, and those with the disease can usually live normal lives. CAUSES  The most common causes of chronic kidney disease are diabetes and high blood pressure (hypertension). Chronic kidney disease may also be caused by:   Diseases that cause the kidneys' filters to become inflamed.   Diseases that affect the immune system.   Genetic diseases.   Medicines that damage the kidneys, such as anti-inflammatory medicines.  Poisoning or exposure to toxic substances.   A reoccurring kidney or urinary infection.   A problem with urine flow. This may be caused by:   Cancer.   Kidney stones.   An enlarged prostate in males. SIGNS AND SYMPTOMS  Because the kidney damage in chronic kidney disease occurs slowly, symptoms develop slowly and may not be obvious until the kidney damage becomes severe. A person may have a kidney disease for years without  showing any symptoms. Symptoms can include:   Swelling (edema) of the legs, ankles, or feet.   Tiredness (lethargy).   Nausea or vomiting.   Confusion.   Problems with urination, such as:   Decreased urine production.   Frequent urination, especially at night.   Frequent accidents in children who are potty trained.   Muscle twitches and cramps.   Shortness of breath.  Weakness.   Persistent itchiness.   Loss of appetite.  Metallic taste in the mouth.  Trouble sleeping.  Slowed development in children.  Short stature in children. DIAGNOSIS  Chronic kidney disease may be detected and diagnosed by tests, including blood, urine, imaging, or kidney biopsy tests.  TREATMENT  Most chronic kidney diseases cannot be cured. Treatment usually involves relieving symptoms and preventing or slowing the progression of the disease. Treatment may include:   A special diet. You may need to avoid alcohol and foods thatare salty and high in potassium.   Medicines. These may:   Lower blood pressure.   Relieve anemia.   Relieve swelling.   Protect the bones. HOME CARE INSTRUCTIONS   Follow your prescribed diet.   Take medicines only as directed by your health care provider. Do not take any new medicines (prescription, over-the-counter, or nutritional supplements) unless approved by your health care provider. Many medicines can worsen your kidney damage or need to have the dose adjusted.   Quit smoking if you smoke. Talk to your health care provider about a smoking cessation program.  Keep all follow-up visits as directed by your health care provider. SEEK IMMEDIATE MEDICAL CARE IF:  Your symptoms get worse or you develop new symptoms.   You develop symptoms of end-stage kidney disease. These include:   Headaches.   Abnormally dark or light skin.   Numbness in the hands or feet.   Easy bruising.   Frequent hiccups.   Menstruation  stops.   You have a fever.   You have decreased urine production.   You havepain or bleeding when urinating. MAKE SURE YOU:  Understand these instructions.  Will watch your condition.  Will get help right away if you are not doing well or get worse. FOR MORE INFORMATION   American Association of Kidney Patients: BombTimer.gl  National Kidney Foundation: www.kidney.[place]: https://mathis.com/  Life Options Rehabilitation Program: www.lifeoptions.org and www.kidneyschool.org Document Released: 12/05/2007 Document Revised: 07/12/2013 Document Reviewed: 10/25/2011 [facility] Patient Information 2015 [place], [place]. This information is not intended to replace advice given to you by your health care provider. Make sure you discuss any questions you have with your health care provider.

## 2014-12-05 NOTE — Telephone Encounter (Signed)
Spoke with her daughter again at 4:40 PM Explained her that the labs showed hyponatremia, and her creatinine had worsened. Daughter said that the pt was not sleeping well and was acting a little odd.  I explained to the daughter that the clinic lab is closed at 87, and that I would like the pt to repeat the labs Went over two options with her- come in to the ER today to get BMET drawn, vs come in to the clinic tomorrow morning. However my recommendation was that she come in today as it has been 2 days since her last lab was done and I was concerned about the hyponatremia. She said she will take the pt to the ER

## 2014-12-05 NOTE — ED Notes (Signed)
Pt sent her by her doctor for low sodium and kidney failure. sts they want to redraw labs.

## 2014-12-05 NOTE — Telephone Encounter (Signed)
Called pt/their family at all 3 numbers, and left voicemail asking them to call and come in to the clinic to repeat the BMP. Her BMP showed increased creatinine and hyponatremia

## 2014-12-08 ENCOUNTER — Encounter: Payer: Self-pay | Admitting: Pulmonary Disease

## 2014-12-09 ENCOUNTER — Telehealth: Payer: Self-pay | Admitting: *Deleted

## 2014-12-09 NOTE — Telephone Encounter (Signed)
No answer for tcm 

## 2014-12-13 ENCOUNTER — Other Ambulatory Visit: Payer: Self-pay

## 2014-12-13 DIAGNOSIS — Z599 Problem related to housing and economic circumstances, unspecified: Secondary | ICD-10-CM

## 2014-12-13 DIAGNOSIS — N186 End stage renal disease: Secondary | ICD-10-CM

## 2014-12-13 NOTE — Patient Outreach (Signed)
[facility]) Care Management  12/13/2014  JOYICE MAGDA 1931/09/24 [id number]   Medical call center report received, assigned [name], RN to outreach for [place] Management services.  Thanks, Ronnell Freshwater. [place], [place] Assistant Phone: [phone] Fax: [phone]

## 2014-12-13 NOTE — Patient Outreach (Signed)
[facility]) Care Management  12/13/2014  [name] 08-23-1931 [id number]   SUBJECTIVE:  Telephone call to patients daughter, Marden Noble.  Patient verbally agreed to [facility] speaking with her daughter Marden Noble regarding her medical information.  Daughter states she would like to have a [facility] care management nurse come back out to the home to check on patient.  Daughter states patients kidney's are deteriorating. Daughter states patient is not on dialysis because she is to old.  Daughter states patient has follow up appointment with Kidney doctor on tomorrow, 12/14/14.  Daughter states patient has had a couple of Emergency room visits within the past 2 months.  Daughter states patient was followed previously by Kathie Rhodes with [facility] care management. Daughter states she would like a nurse to follow up with her mother to check on her and make sure she and her sisters are doing the right things for their mother.  Daughter states she is  going to need some additional assistance with her mothers care and would like information regarding this.  Daughter states patients blood sugar today 277 fasting.  Daughter states patients blood pressure has been elevated when taken in the emergency room. Daughter states patients blood pressure is not monitored at home. Daughter states patient has not shown any symptoms of shortness of breath or swelling.   ASSESSMENT:  Referral per nurse telephone advise line. Referral:  Daughter called nurse telephone advise line to attempt to reach Kathie Rhodes, nurse with [facility] care management.  Emergency room visits noted for patient in EPIC on 12/05/14, 11/27/14 and 10/29/14.  Patient/daughter (caregiver) would benefit from community case manager for follow up.  Patient has had status change with kidney's per daughter.  Patient/caregiver would benefit from social work referral to provide information on personal care assistance.     PLAN: RNCM will refer patient  to community case Freight forwarder and Education officer, museum.   [name] RN,BSN,CCM [place] Coordinator [phone]

## 2014-12-14 ENCOUNTER — Encounter: Payer: Self-pay | Admitting: *Deleted

## 2014-12-14 NOTE — Patient Outreach (Signed)
[place] [facility]) Care Management  12/14/2014  BLESSIN KANNO 1931-08-26 [id number]   Request from [name], RN to assign SW and Charity fundraiser, assigned [employer], LCSW and [name], RN.  Thanks, Ronnell Freshwater. [place], [place] Assistant Phone: [phone] Fax: [phone]

## 2014-12-15 ENCOUNTER — Other Ambulatory Visit: Payer: Self-pay | Admitting: Pulmonary Disease

## 2014-12-15 ENCOUNTER — Other Ambulatory Visit: Payer: Self-pay | Admitting: *Deleted

## 2014-12-15 NOTE — Telephone Encounter (Signed)
Pt daughter called requesting Ferrex to be filled.

## 2014-12-16 ENCOUNTER — Encounter: Payer: Self-pay | Admitting: *Deleted

## 2014-12-16 NOTE — Patient Outreach (Addendum)
[facility] [facility]) Care Management  [facility] Social Work  12/16/2014  [name] 04/28/1931 [id number]  Subjective:    "I need help at home with mom".  Objective:   CSW agreed to assist patient and patient's daughter, [name] with obtaining home care services for patient.  Current Medications:  Current Outpatient Prescriptions  Medication Sig Dispense Refill  . amLODipine (NORVASC) 10 MG tablet TAKE ONE (1) TABLET EACH DAY 90 tablet 3  . BD INSULIN SYRINGE ULTRAFINE 31G X 5/16" 0.3 ML MISC 1 SYRINGE BY DOES NOT APPLY ROUTE 2 (TWO) TIMES DAILY. 60 each 3  . calcitRIOL (ROCALTROL) 0.25 MCG capsule Take 1 capsule (0.25 mcg total) by mouth daily. 90 capsule 3  . cloNIDine (CATAPRES - DOSED IN MG/24 HR) 0.2 mg/24hr patch Place 1 patch (0.2 mg total) onto the skin once a week. (Patient taking differently: Place 0.2 mg onto the skin every Saturday. ) 12 patch 3  . clopidogrel (PLAVIX) 75 MG tablet Take 1 tablet (75 mg total) by mouth daily. 90 tablet 4  . feeding supplement (GLUCERNA SHAKE) LIQD Take 237 mLs by mouth 3 (three) times daily between meals. 24 Can 6  . FERREX 150 150 MG capsule Take 150 mg by mouth 2 (two) times daily.  5  . furosemide (LASIX) 40 MG tablet TAKE 1 AND 1/2 TABLETS (60 MG TOTAL) BY MOUTH DAILY. 90 tablet 3  . glucose blood (ACCU-CHEK AVIVA PLUS) test strip Use to test blood glucose 3 times daily. Dx: E13.9 100 each 12  . hydrALAZINE (APRESOLINE) 25 MG tablet Take 1 tablet (25 mg total) by mouth 2 (two) times daily. 60 tablet 1  . Insulin Isophane & Regular Human (HUMULIN 70/30 MIX) (70-30) 100 UNIT/ML PEN Inject subcutaneously 18 units in the morning and 6 units in the evening 15 mL 2  . Insulin Pen Needle (ULTRA-THIN II MINI PEN NEEDLE) 31G X 5 MM MISC Use to inject insulin twice a day. Dx code: E13.9 100 each 2  . metoprolol succinate (TOPROL-XL) 50 MG 24 hr tablet TAKE 1 TABLET BY MOUTH EVERY DAY WITH OR IMMEDIATELY FOLLOWING A MEAL 90 tablet 3  .  Multiple Vitamin (MULITIVITAMIN WITH MINERALS) TABS Take 1 tablet by mouth daily.    . pravastatin (PRAVACHOL) 40 MG tablet TAKE ONE (1) TABLET EACH                EVENING 90 tablet 3   No current facility-administered medications for this visit.    Functional Status:  In your present state of health, do you have any difficulty performing the following activities: 12/02/2014 11/28/2014  Hearing? N N  Vision? N Y  Difficulty concentrating or making decisions? [name]  Walking or climbing stairs? Y Y  Dressing or bathing? Y N  Doing errands, shopping? N Y  Conservation officer, nature and eating ? - -  Using the Toilet? - -  In the past six months, have you accidently leaked urine? - -  Do you have problems with loss of bowel control? - -  Managing your Medications? - -  Managing your Finances? - -  Housekeeping or managing your Housekeeping? - -    Fall/Depression Screening:  PHQ 2/9 Scores 12/02/2014 11/03/2014 10/27/2014 09/15/2014 08/18/2014 08/18/2014 04/14/2014  PHQ - 2 Score 0 0 0 0 0 0 0    Assessment:   CSW was able to make initial contact with patient and patient's daughter, [name] today to perform phone assessment, as well as  assess and assist with social work needs and services.  CSW introduced self, explained role and types of services provided through [place] [facility] ([place] [facility]).  CSW further explained to patient that CSW works with [name], Telephonic Nurse Case Manager with [place] [facility].  CSW then explained the reason for the call, indicating that [name] thought that patient would benefit from social work services and resources to assist with obtaining home care services.  [name] admitted that patient was living with her brother a little over a week ago, but that she and her daughter [name] have moved patient into their home to care for her around the clock.  CSW obtained two HIPAA compliant identifiers from patient, which included  patient's name and date of birth. [name] indicated that there is roughly a time-span of five hours per day, Monday through Friday, when patient is left alone, for which they would like to obtain home care services.  [name] admitted that she, nor [name], or even patient, are able to afford any out-of-pocket expense, being that they are all on a fixed income. [name] is wanting a home care provider to assist with light housekeeping duties, meal preparation, and assist patient with performing activities of daily living.  CSW explained to patient and [name] that patient may be responsible for an out-of-pocket expense, if patient does not currently receive Adult Medicaid coverage or if patient is ineligible for coverage.  Patient admitted that she may be over-qualified, but wanted to at least proceed with the application process to see what her out-of-pocket expense would be.  CSW agreed to assist patient with completion of applications for [employer] ([place] [facility]) through the [place], [employer] ([place]) through the [facility] Department of Social services and [employer] ([place]) through [facility].  CSW also agreed to mail brochures for these agencies to patient's home for her independent review. CSW will mail a consent for treatment form to patient's home for patient to sign. No additional social work needs have been identified at this time. CSW provided patient and [name] with CSW's contact information, encouraging patient and [name] to contact CSW directly if additional social work needs are identified.  Otherwise, CSW will follow-up with patient in one week.   Plan:   CSW will contact patient on Wednesday, October 12th to ensure that patient received the packet of information that CSW mailed to patient's home, as well as assist with completion of applications for home care services, if  necessary. CSW will fax a barriers letter and correspondence letter to patient's Primary Care Physician, Dr. [name]  to ensure that Dr. [name] is aware of CSW's involvement with patient. CSW will converse with [name], Telephonic Nurse Case Manager with [place] Management to report findings of initial phone conversation with patient today, CSW will prescribe and print EMMI information for patient to review at the initial home visit.  Nat Christen, BSW, MSW, LCSW  Licensed Education officer, [facility]  Mailing [place] [address] [place], [place] [address] Physical Address-[address], [place], [place] [address] Toll Free Main # [phone] Fax # [phone] Cell # [phone]  Fax # [phone]  [email or web address]

## 2014-12-19 ENCOUNTER — Other Ambulatory Visit: Payer: Self-pay | Admitting: *Deleted

## 2014-12-19 NOTE — Patient Outreach (Signed)
  [name] Care Management  12/19/2014  [name] 11-Dec-1931 [id number]  Initial outreach call from community RN however unsuccessful. Will continue attempts to reach pt for [facility] services.  [name], RN Care Management Coordinator [facility] Main Office [phone]

## 2014-12-21 ENCOUNTER — Other Ambulatory Visit: Payer: Self-pay | Admitting: *Deleted

## 2014-12-21 NOTE — Patient Outreach (Signed)
[facility]) Care Management  12/21/2014  [name] November 22, 1931 [id number]   CSW was able to make contact with patient's daughter, Richardean Sale today to follow-up regarding social work services and resources for patient.  According to [name], she and her siblings, along with patient, are all in agreement with a referral for home care services through [facility].  This information was confirmed by patient's RNCM with [facility], [name].  Ms. Zigmund Daniel reports that she was able to make a referral for patient to Hospice and that services will be initiated within the next week.  [name] indicated that she received the packet of resource information that CSW mailed to patient's home and that she and her sister have reviewed the contents.  Patient, nor [name] are interested in Location manager services at this time.  [name] also denied the need for social work services through [facility] with Scientist, clinical (histocompatibility and immunogenetics).   CSW will perform a case closure on patient, as all goals of treatment have been met from social work standpoint and no additional social work needs have been identified at this time. CSW will notify patient's RNCM with [facility], [name] of CSW's plans to close patient's case. CSW will fax a correspondence letter to patient's Primary Care Physician, Dr. [name] to ensure that Dr. [name] is aware of CSW's involvement with patient.   Nat Christen, BSW, MSW, LCSW  Licensed Education officer, [facility]  Mailing [place] [address], [place], [place] [address] Physical Address-[address], [place], [place] [address] Toll Free Main # [phone] Fax # [phone] Cell # [phone]  Fax # [phone]  [email or web address]

## 2014-12-21 NOTE — Patient Outreach (Signed)
[facility]) Care Management  12/21/2014  [name] 1931-09-20 [id number]   RN spoke with pt who granted permission for RN to speak with her daughter today [name] and make the appropriate referrals. Daughter states her requested needs for services in the home and help her mother needs at this time. RN reintroduced [facility] services and the purpose of today's call for [facility] services. Discussed options for her requested needs and informed this daughter that [facility] social worker) has been in contact with another child of this pt. Daughter requested social workers number which was provided for confirming any available services. RN also provided other agencies in the area for aide services and respite needs. RN offered community case management services based on the initial referral to assist pt with her CBG however daughter states her number will not get better due to her deteriorating health and declined [facility] community home visits by this RN at this time however appreciative but feels this would not benefit the pt at this time. Also discussed Home-Based Palliative Care and the purpose of this service in benefiting the pt if desired. Daughter receptive and requested a referral. RN provided the daughter with the information and will contact agency as requested. RN offered to continue follow up outreach calls until such services as been initiated as daughter very appreciative and delighted RN offered to continue contact. Requested to schedule the next follow up call for next Wednesday at 10:00AM (daughter agreed). Will follow up accordingly.  [name], RN Care Management Coordinator [facility] Main Office [phone]

## 2014-12-21 NOTE — Patient Outreach (Signed)
[facility]) Care Management  12/21/2014  LAKASHIA COLLISON 01/24/1932 [id number]   RN contacted [place] with [facility] and spoke with Vickie. As requested RN made a referral to agency to contact pt's daughter [name] concerning possible services that could be offered to this pt. Vickie will contact the daughter as requested and follow up with this RN concerning progress or the initiation of services. RN has informed agency that a follow up call will be made to this pt's daughter next week concerning the progress of any services initated with this referral. No further inquires or request at this time.  [name], RN Care Management Coordinator [facility] Main Office [phone]

## 2014-12-22 ENCOUNTER — Telehealth: Payer: Self-pay

## 2014-12-22 NOTE — Telephone Encounter (Signed)
Called in VO for home hospice services.

## 2014-12-22 NOTE — Telephone Encounter (Signed)
Took call from [place] with [facility] [phone]) [facility] nurse assessed patient on 10/12 and is requesting a referral for home Hospice services.  [facility] notes are in epic. Will you give verbal order for hospice services?

## 2014-12-22 NOTE — Telephone Encounter (Signed)
[facility] documentation noted. I agree with referral for home hospice. Please let me know if I need to put in an order.  Thanks, [name], MD  Internal Medicine Teaching Service PGY-2

## 2014-12-26 ENCOUNTER — Other Ambulatory Visit: Payer: Self-pay | Admitting: *Deleted

## 2014-12-26 ENCOUNTER — Telehealth: Payer: Self-pay | Admitting: *Deleted

## 2014-12-26 NOTE — Telephone Encounter (Signed)
Talked to Dr [name] - state ok for the doctors at [facility] to sigh DNR form. Pam called / informed.

## 2014-12-26 NOTE — Telephone Encounter (Signed)
Call from [facility] @ Hospice/Pallative Care - [name] is a pt of theirs; wants to know if you want their doctors to sign the DNR Form. Telephone # [phone].

## 2014-12-26 NOTE — Patient Outreach (Signed)
[facility]) Care Management  12/26/2014  ANEESHA HOLLORAN 04/28/1931 [id number]  RN spoke with pt's daughter Emi Belfast consented) concerning progress on the requested referral from last week with [facility] Pall[facility] Care consult (Vickie). Daughter states pt has been approved for services and very grateful for the assistance. Daughter has also inquired on possible respite services or adult daycare. RN has encouraged daughter to speak with the other daughter Trilby Leaver who [facility] social worker as mail resources that may enclose such information  However RN gave this daughter the option for [facility] social worker to contact her directly concerning her ongoing inquires. Daughter has agree and again grateful for the assistance. RN once again offered community home visits as daughter continues to declined but again receptive to ongoing telephonic follow up. RN offered to follow up one additional time to make sure services are in place with Hospice next week (daughter receptive). No additional inquires or request at this time as this RN will notify social worker Di Kindle S.) to contact daughter concerning the needed resources.  [name], RN Care Management Coordinator [facility] Main Office [phone]

## 2014-12-28 ENCOUNTER — Ambulatory Visit: Payer: Self-pay | Admitting: *Deleted

## 2014-12-29 ENCOUNTER — Encounter: Payer: Self-pay | Admitting: Pulmonary Disease

## 2014-12-29 ENCOUNTER — Ambulatory Visit (INDEPENDENT_AMBULATORY_CARE_PROVIDER_SITE_OTHER): Admitting: Pulmonary Disease

## 2014-12-29 VITALS — BP 158/64 | HR 55 | Temp 98.3°F | Ht 64.0 in | Wt 128.3 lb

## 2014-12-29 DIAGNOSIS — I10 Essential (primary) hypertension: Secondary | ICD-10-CM | POA: Diagnosis not present

## 2014-12-29 DIAGNOSIS — E785 Hyperlipidemia, unspecified: Secondary | ICD-10-CM

## 2014-12-29 DIAGNOSIS — Z23 Encounter for immunization: Secondary | ICD-10-CM

## 2014-12-29 DIAGNOSIS — Z794 Long term (current) use of insulin: Secondary | ICD-10-CM

## 2014-12-29 DIAGNOSIS — E139 Other specified diabetes mellitus without complications: Secondary | ICD-10-CM | POA: Diagnosis not present

## 2014-12-29 LAB — GLUCOSE, CAPILLARY: Glucose-Capillary: 209 mg/dL — ABNORMAL HIGH (ref 65–99)

## 2014-12-29 LAB — POCT GLYCOSYLATED HEMOGLOBIN (HGB A1C): Hemoglobin A1C: 6

## 2014-12-29 MED ORDER — INSULIN ISOPHANE & REGULAR (HUMAN 70-30)100 UNIT/ML KWIKPEN: PEN_INJECTOR | SUBCUTANEOUS | Status: DC

## 2014-12-29 NOTE — Progress Notes (Signed)
Subjective:   Patient ID: [name], female    DOB: 07-21-1931, 81 y.o.   MRN: [id number]  HPI [name] is an 81 year old woman with history of hyperlipidemia, hypertension, diabetes type 2, chronic kidney disease stage V, CVA, CAD, severe dementia presenting for follow-up.  She was last seen in clinic 12/02/2014.   She is accompanied by her daguther [name] today.  She is now receiving home hospice services.  BP 139/76 at home checked by home health RN.  Saw Dr. Lorrene Reid 12/14/2014. Changed her iron medication from ferrex to Nu Iron. Discontinued enapril. Has follow up 01/16/2015  No problems identified by family.  Review of Systems Unable to obtain due to dementia.  Past Medical History  Diagnosis Date  . Hyperlipidemia   . Hypertension   . Diabetes mellitus   . Chronic kidney disease     stage IV  . RAS (renal artery stenosis) ([place])   . Stroke [facility])     s/p CVA unknown timing  . Ischemic cardiomyopathy     EF 20%  . Dementia   . CAD (coronary artery disease)     s/p PTCA May 2005, stenting of an intermittent blanch of the CMX and the angioplasty of teh diagonal branch to the left descending  . Cardiomyopathy, ischemic     Current Outpatient Prescriptions on File Prior to Visit  Medication Sig Dispense Refill  . amLODipine (NORVASC) 10 MG tablet TAKE ONE (1) TABLET EACH DAY 90 tablet 3  . BD INSULIN SYRINGE ULTRAFINE 31G X 5/16" 0.3 ML MISC 1 SYRINGE BY DOES NOT APPLY ROUTE 2 (TWO) TIMES DAILY. 60 each 3  . calcitRIOL (ROCALTROL) 0.25 MCG capsule Take 1 capsule (0.25 mcg total) by mouth daily. 90 capsule 3  . cloNIDine (CATAPRES - DOSED IN MG/24 HR) 0.2 mg/24hr patch Place 1 patch (0.2 mg total) onto the skin once a week. (Patient taking differently: Place 0.2 mg onto the skin every Saturday. ) 12 patch 3  . clopidogrel (PLAVIX) 75 MG tablet Take 1 tablet (75 mg total) by mouth daily. 90 tablet 4  . feeding supplement (GLUCERNA SHAKE) LIQD Take 237 mLs by  mouth 3 (three) times daily between meals. 24 Can 6  . FERREX 150 150 MG capsule Take 150 mg by mouth 2 (two) times daily.  5  . furosemide (LASIX) 40 MG tablet TAKE 1 AND 1/2 TABLETS (60 MG TOTAL) BY MOUTH DAILY. 90 tablet 3  . glucose blood (ACCU-CHEK AVIVA PLUS) test strip Use to test blood glucose 3 times daily. Dx: E13.9 100 each 12  . hydrALAZINE (APRESOLINE) 25 MG tablet Take 1 tablet (25 mg total) by mouth 2 (two) times daily. 60 tablet 1  . Insulin Isophane & Regular Human (HUMULIN 70/30 MIX) (70-30) 100 UNIT/ML PEN Inject subcutaneously 18 units in the morning and 6 units in the evening 15 mL 2  . Insulin Pen Needle (ULTRA-THIN II MINI PEN NEEDLE) 31G X 5 MM MISC Use to inject insulin twice a day. Dx code: E13.9 100 each 2  . metoprolol succinate (TOPROL-XL) 50 MG 24 hr tablet TAKE 1 TABLET BY MOUTH EVERY DAY WITH OR IMMEDIATELY FOLLOWING A MEAL 90 tablet 3  . Multiple Vitamin (MULITIVITAMIN WITH MINERALS) TABS Take 1 tablet by mouth daily.    . pravastatin (PRAVACHOL) 40 MG tablet TAKE ONE (1) TABLET EACH                EVENING 90 tablet 3   No  current facility-administered medications on file prior to visit.    Today's Vitals   12/29/14 1547 12/29/14 1616  BP: 137/122 158/64  Pulse: 58 55  Temp: 98.3 F (36.8 C)   TempSrc: Oral   Height: 5\' 4"  (1.626 m)   Weight: 128 lb 4.8 oz (58.196 kg)   SpO2: 100%   PainSc: 0-No pain      Objective:  Physical Exam  Constitutional: She appears well-developed and well-nourished. No distress.  HENT:  Head: Normocephalic and atraumatic.  Eyes: EOM are normal. Pupils are equal, round, and reactive to light.  Neck: Normal range of motion. Neck supple.  Cardiovascular: Normal rate and regular rhythm.   Pulmonary/Chest: Effort normal and breath sounds normal. No respiratory distress. She has no wheezes. She has no rales.  Abdominal: Soft. Bowel sounds are normal. She exhibits no distension.  Musculoskeletal: She exhibits no edema.    Neurological: She is alert.  Skin: Skin is warm and dry.   Assessment & Plan:  Please refer to problem based charting.

## 2014-12-30 ENCOUNTER — Other Ambulatory Visit: Payer: Self-pay | Admitting: Pulmonary Disease

## 2014-12-30 NOTE — Assessment & Plan Note (Signed)
BP Readings from Last 3 Encounters:  12/29/14 158/64  12/05/14 160/69  12/02/14 151/90    Lab Results  Component Value Date   NA 134* 12/05/2014   K 3.7 12/05/2014   CREATININE 3.62* 12/05/2014    Assessment: Blood pressure control: Improved. Home BP readings within goal. Progress toward BP goal:  Improving  Plan: Medications:  Continue amlodipine 10mg  daily, clonidine 0.2mg  patch weekly, Lasix 60mg  daily, hydralazine 25mg  BID, Toprol XL 50mg  daily.

## 2014-12-30 NOTE — Assessment & Plan Note (Signed)
Discussed with family risks and benefits of pravastatin for secondary prevention in ASCVD. Given her severe dementia and CKD5 now on home hospice, we decided to discontinue pravastatin 40mg  daily.

## 2014-12-30 NOTE — Assessment & Plan Note (Signed)
Lab Results  Component Value Date   HGBA1C 6.0 12/29/2014   HGBA1C 5.6 08/18/2014   HGBA1C 6.1 04/14/2014     Assessment: Diabetes control: Controlled Progress toward A1C goal:  At goal Comments: 1 recent hypoglycemic reading on meter due to not being fed while with different family member than primary caretaker.  Plan: Medications:  Decrease Humulin 70/30 to 15 u in the morning and 6 u in the evening.

## 2015-01-02 ENCOUNTER — Other Ambulatory Visit: Payer: Self-pay | Admitting: Pulmonary Disease

## 2015-01-02 ENCOUNTER — Other Ambulatory Visit: Payer: Self-pay | Admitting: *Deleted

## 2015-01-02 ENCOUNTER — Encounter: Payer: Self-pay | Admitting: *Deleted

## 2015-01-02 NOTE — Patient Outreach (Signed)
[facility]) Care Management  01/02/2015  [name] 10-04-1931 [id number]   RN spoke with with pt's daughter [name]) concerning Hospice services with [facility]. Daughter reports services is involved and she is very pleased with the services being provided. States the pt is having some good day with a recent  bowel elimination and pt express however how happy she is with the involvement with Palliative care. States they are providing all the services needed with no encountered issues or problems. States the pt's provider if aware and happy that Hospice is involved in the earlier stages with this pt. No other issues or request at this time as caregiver continued to express how grateful she was for [facility] RN involvement in expediting the referral of service via Palliative Care/Hospice. Due to [facility] pt can now avoid placement for SNF.  No other request at this time as this case will be closed via [facility] services.  [name], RN Care Management Coordinator [place] [facility] Main Office [phone]

## 2015-01-06 NOTE — Progress Notes (Signed)
Internal Medicine Clinic Attending  Case discussed with Dr. Krall soon after the resident saw the patient.  We reviewed the resident's history and exam and pertinent patient test results.  I agree with the assessment, diagnosis, and plan of care documented in the resident's note. 

## 2015-01-22 ENCOUNTER — Other Ambulatory Visit: Payer: Self-pay | Admitting: Internal Medicine

## 2015-02-04 ENCOUNTER — Other Ambulatory Visit: Payer: Self-pay | Admitting: Pulmonary Disease

## 2015-03-04 ENCOUNTER — Other Ambulatory Visit: Payer: Self-pay | Admitting: Internal Medicine

## 2015-04-01 ENCOUNTER — Other Ambulatory Visit: Payer: Self-pay | Admitting: Pulmonary Disease

## 2015-04-11 ENCOUNTER — Telehealth: Payer: Self-pay | Admitting: *Deleted

## 2015-04-11 NOTE — Telephone Encounter (Signed)
Iron, calcitriol, and pravastatin taken off list. Vasotec already not on list.  Thanks, [name], MD  Internal Medicine PGY-2

## 2015-04-11 NOTE — Telephone Encounter (Signed)
Per hospice rn, pt saw dr Lorrene Reid, nephrologist yesterday 1/30 and dr Lorrene Reid d'cd vasotec, iron, pravastatin and calcitrol. Could you please make note of this in the [place], [place], [phone]

## 2015-05-09 ENCOUNTER — Telehealth: Payer: Self-pay

## 2015-05-09 NOTE — Telephone Encounter (Signed)
[name] with [facility] called to clarify insulin orders and report hyperglycemic readings for patient.  Family is administering according to orders updated in October but patient is having BS between 130-420 (average 200-300)  [name] states she feels confident that family is checking at appropriate times, not right after meals etc.  A1C in October was 6.0  Please advise.  Any changes?  She is scheduled in May

## 2015-05-10 NOTE — Telephone Encounter (Signed)
If she is asymptomatic (thirst, polyuria), no changes. We made her A1c goal 8.5% since she is hospice.  [name], MD  Internal Medicine Teaching Service PGY-2

## 2015-05-15 ENCOUNTER — Other Ambulatory Visit: Payer: Self-pay | Admitting: Pulmonary Disease

## 2015-05-25 ENCOUNTER — Other Ambulatory Visit: Payer: Self-pay | Admitting: Pulmonary Disease

## 2015-06-24 ENCOUNTER — Encounter (HOSPITAL_COMMUNITY): Payer: Self-pay | Admitting: *Deleted

## 2015-06-24 ENCOUNTER — Inpatient Hospital Stay (HOSPITAL_COMMUNITY)
Admission: EM | Admit: 2015-06-24 | Discharge: 2015-06-28 | DRG: 871 | Disposition: A | Discharge status: Hospice | Attending: Internal Medicine | Admitting: Internal Medicine

## 2015-06-24 DIAGNOSIS — A419 Sepsis, unspecified organism: Principal | ICD-10-CM | POA: Diagnosis present

## 2015-06-24 DIAGNOSIS — Z794 Long term (current) use of insulin: Secondary | ICD-10-CM

## 2015-06-24 DIAGNOSIS — N184 Chronic kidney disease, stage 4 (severe): Secondary | ICD-10-CM | POA: Diagnosis present

## 2015-06-24 DIAGNOSIS — R739 Hyperglycemia, unspecified: Secondary | ICD-10-CM | POA: Diagnosis not present

## 2015-06-24 DIAGNOSIS — I5042 Chronic combined systolic (congestive) and diastolic (congestive) heart failure: Secondary | ICD-10-CM | POA: Diagnosis present

## 2015-06-24 DIAGNOSIS — Z79899 Other long term (current) drug therapy: Secondary | ICD-10-CM

## 2015-06-24 DIAGNOSIS — E139 Other specified diabetes mellitus without complications: Secondary | ICD-10-CM | POA: Diagnosis present

## 2015-06-24 DIAGNOSIS — I10 Essential (primary) hypertension: Secondary | ICD-10-CM | POA: Diagnosis present

## 2015-06-24 DIAGNOSIS — I701 Atherosclerosis of renal artery: Secondary | ICD-10-CM | POA: Diagnosis present

## 2015-06-24 DIAGNOSIS — N179 Acute kidney failure, unspecified: Secondary | ICD-10-CM | POA: Diagnosis present

## 2015-06-24 DIAGNOSIS — R0902 Hypoxemia: Secondary | ICD-10-CM | POA: Diagnosis present

## 2015-06-24 DIAGNOSIS — I2510 Atherosclerotic heart disease of native coronary artery without angina pectoris: Secondary | ICD-10-CM | POA: Diagnosis present

## 2015-06-24 DIAGNOSIS — I509 Heart failure, unspecified: Secondary | ICD-10-CM

## 2015-06-24 DIAGNOSIS — Z90710 Acquired absence of both cervix and uterus: Secondary | ICD-10-CM

## 2015-06-24 DIAGNOSIS — Z515 Encounter for palliative care: Secondary | ICD-10-CM | POA: Diagnosis present

## 2015-06-24 DIAGNOSIS — Z955 Presence of coronary angioplasty implant and graft: Secondary | ICD-10-CM

## 2015-06-24 DIAGNOSIS — Z7902 Long term (current) use of antithrombotics/antiplatelets: Secondary | ICD-10-CM

## 2015-06-24 DIAGNOSIS — F03C0 Unspecified dementia, severe, without behavioral disturbance, psychotic disturbance, mood disturbance, and anxiety: Secondary | ICD-10-CM | POA: Diagnosis present

## 2015-06-24 DIAGNOSIS — J189 Pneumonia, unspecified organism: Secondary | ICD-10-CM

## 2015-06-24 DIAGNOSIS — I214 Non-ST elevation (NSTEMI) myocardial infarction: Secondary | ICD-10-CM

## 2015-06-24 DIAGNOSIS — I130 Hypertensive heart and chronic kidney disease with heart failure and stage 1 through stage 4 chronic kidney disease, or unspecified chronic kidney disease: Secondary | ICD-10-CM | POA: Diagnosis present

## 2015-06-24 DIAGNOSIS — Z66 Do not resuscitate: Secondary | ICD-10-CM | POA: Diagnosis present

## 2015-06-24 DIAGNOSIS — Z8673 Personal history of transient ischemic attack (TIA), and cerebral infarction without residual deficits: Secondary | ICD-10-CM

## 2015-06-24 DIAGNOSIS — E1310 Other specified diabetes mellitus with ketoacidosis without coma: Secondary | ICD-10-CM | POA: Diagnosis present

## 2015-06-24 DIAGNOSIS — I255 Ischemic cardiomyopathy: Secondary | ICD-10-CM | POA: Diagnosis present

## 2015-06-24 DIAGNOSIS — Z6822 Body mass index (BMI) 22.0-22.9, adult: Secondary | ICD-10-CM

## 2015-06-24 DIAGNOSIS — E1122 Type 2 diabetes mellitus with diabetic chronic kidney disease: Secondary | ICD-10-CM | POA: Diagnosis present

## 2015-06-24 DIAGNOSIS — R6520 Severe sepsis without septic shock: Secondary | ICD-10-CM | POA: Diagnosis present

## 2015-06-24 DIAGNOSIS — E785 Hyperlipidemia, unspecified: Secondary | ICD-10-CM | POA: Diagnosis present

## 2015-06-24 DIAGNOSIS — F0390 Unspecified dementia without behavioral disturbance: Secondary | ICD-10-CM | POA: Diagnosis present

## 2015-06-24 LAB — CBG MONITORING, ED: Glucose-Capillary: 541 mg/dL — ABNORMAL HIGH (ref 65–99)

## 2015-06-24 MED ORDER — SODIUM CHLORIDE 0.9 % IV BOLUS (SEPSIS)
1000.0000 mL | INTRAVENOUS | Status: AC
  Administered 2015-06-25: 1000 mL via INTRAVENOUS

## 2015-06-24 NOTE — ED Notes (Addendum)
Daughter stated "last time she was here she had to have a catheter.  She had equal to 2 soda bottles of urine.  She's had an increase in urination but usually she is able to tell us what she wants even though she doesn't know who we are.  She's not talking.  Normally she's walking and talking.  She was placed in Hospice last September when she was here."  Pt placed on monitor.

## 2015-06-24 NOTE — ED Notes (Signed)
Bed: WA21 Expected date:  Expected time:  Means of arrival:  Comments: EMS 37F hyperglycemia

## 2015-06-24 NOTE — ED Notes (Addendum)
Pt.CBG 541, RN,Elaine made aware.

## 2015-06-24 NOTE — ED Notes (Signed)
Per [facility], Pt has had 31 units of insulin today, is a Hospice pt and they requested she come in, has dementia & sundowner's, renal failure, has been in cardiac arrest multiple times and been brought back.  Is tachycardic, has been responsive to verbal stimuli.

## 2015-06-24 NOTE — ED Provider Notes (Signed)
CSN: [id number]     Arrival date & time 06/24/15  2234 History  By signing my name below, I, Hansel Feinstein, attest that this documentation has been prepared under the direction and in the presence of Merryl Hacker, MD. Electronically Signed: Hansel Feinstein, ED Scribe. 06/24/2015. 11:51 PM.    Chief Complaint  Patient presents with  . Hyperglycemia   The history is provided by a relative. No language interpreter was used.   HPI Comments: [name] is a 82 y.o. female with h/o HLD, HTN, DM, stage IV CKD, RAS, ICM who presents to the Emergency Department complaining of "shaking" chills onset this morning with associated emesis. Per daughter, the pt has not been verbally responsive today, but is talkative and ambulatory at baseline. Daughter also reports a CBG of 568 this morning, which was persistently elevated throughout the day, despite increases in insulin doses, as instructed by hospice nurse. She states that the pt was also seen by her PCP today, who suspected a UTI and prescribed antibiotics. Pt lives with daughter. Daughter denies fever.   Patient is currently in hospice for her renal failure per the family. She is DO NOT RESUSCITATE/DO NOT INTUBATE. However, they do want any reversible conditions addressed including antibiotics.  Level V caveat for altered mental status.  Past Medical History  Diagnosis Date  . Hyperlipidemia   . Hypertension   . Diabetes mellitus   . Chronic kidney disease     stage IV  . RAS (renal artery stenosis) ([place])   . Stroke [facility])     s/p CVA unknown timing  . Ischemic cardiomyopathy     EF 20%  . Dementia   . CAD (coronary artery disease)     s/p PTCA May 2005, stenting of an intermittent blanch of the CMX and the angioplasty of teh diagonal branch to the left descending  . Cardiomyopathy, ischemic    Past Surgical History  Procedure Laterality Date  . Appendectomy    . Total abdominal hysterectomy w/ bilateral salpingoophorectomy    .  Angioplasty      and stent   Family History  Problem Relation Age of Onset  . Kidney disease Brother    Social History  Substance Use Topics  . Smoking status: Never Smoker   . Smokeless tobacco: Never Used  . Alcohol Use: No   OB History    No data available     Review of Systems  Unable to perform ROS: Mental status change  Constitutional: Negative for fever.  Gastrointestinal: Positive for vomiting.  All other systems reviewed and are negative.  Allergies  Review of patient's allergies indicates no known allergies.  Home Medications   Prior to Admission medications   Medication Sig Start Date End Date Taking? Authorizing Provider  amLODipine (NORVASC) 10 MG tablet TAKE 1 TABLET BY MOUTH EVERY DAY 03/07/15  Yes Milagros Loll, MD  BD INSULIN SYRINGE ULTRAFINE 31G X 5/16" 0.3 ML MISC 1 SYRINGE BY DOES NOT APPLY ROUTE 2 (TWO) TIMES DAILY. 04/22/14  Yes Luan Moore, MD  ciprofloxacin (CIPRO) 250 MG tablet Take 250 mg by mouth 2 (two) times daily.   Yes Historical Provider, MD  cloNIDine (CATAPRES - DOSED IN MG/24 HR) 0.2 mg/24hr patch PLACE 1 PATCH (0.2 MG TOTAL) ONTO THE SKIN ONCE A WEEK. 05/26/15  Yes Milagros Loll, MD  clopidogrel (PLAVIX) 75 MG tablet Take 1 tablet (75 mg total) by mouth daily. 08/04/14  Yes Milagros Loll, MD  [facility]  IBUPROFEN 200 MG tablet Take 200 mg by mouth every 4 (four) hours as needed. for pain 05/25/15  Yes Historical Provider, MD  [facility] SENNA PLUS 8.6-50 MG tablet Take 1 tablet by mouth 2 (two) times daily as needed. For constipation 06/13/15  Yes Historical Provider, MD  furosemide (LASIX) 40 MG tablet TAKE 1 AND 1/2 TABLETS (60 MG TOTAL) BY MOUTH DAILY. 12/05/14  Yes Burgess Estelle, MD  glucose blood (ACCU-CHEK AVIVA PLUS) test strip Use to test blood glucose 3 times daily. Dx: E13.9 07/12/14  Yes Luan Moore, MD  hydrALAZINE (APRESOLINE) 25 MG tablet TAKE 1 TABLET (25 MG TOTAL) BY MOUTH 2 (TWO) TIMES DAILY. 05/17/15  Yes Milagros Loll, MD   hydrOXYzine (ATARAX/VISTARIL) 10 MG tablet Take 10 mg by mouth every 6 (six) hours as needed. For itching 06/13/15  Yes Historical Provider, MD  Insulin Isophane & Regular Human (HUMULIN 70/30 KWIKPEN) (70-30) 100 UNIT/ML PEN Inject subcutaneously 15 units in the morning and 6 units in the evening. 04/03/15  Yes Milagros Loll, MD  Insulin Pen Needle (ULTRA-THIN II MINI PEN NEEDLE) 31G X 5 MM MISC Use to inject insulin twice a day. Dx code: E13.9 10/07/14  Yes Milagros Loll, MD  metoprolol succinate (TOPROL-XL) 50 MG 24 hr tablet TAKE 1 TABLET BY MOUTH EVERY DAY WITH OR IMMEDIATELY FOLLOWING A MEAL 11/16/14  Yes Milagros Loll, MD  temazepam (RESTORIL) 15 MG capsule Take 15 mg by mouth at bedtime as needed and may repeat dose one time if needed. For sleep. 05/25/15  Yes Historical Provider, MD   BP 170/103 mmHg  Pulse 118  Temp(Src) 99.3 F (37.4 C) (Rectal)  Resp 34  Ht 5\' 4"  (1.626 m)  Wt 129 lb (58.514 kg)  BMI 22.13 kg/m2  SpO2 91% Physical Exam  Constitutional: No distress.  Ill-appearing, arousable to verbal stimulus  HENT:  Head: Normocephalic and atraumatic.  Mucous membranes dry  Eyes: Pupils are equal, round, and reactive to light.  Neck: Neck supple.  Cardiovascular: Regular rhythm and normal heart sounds.   Tachycardia  Pulmonary/Chest: Effort normal. No respiratory distress. She has no wheezes. She has rales.  Abdominal: Soft. Bowel sounds are normal. There is no tenderness. There is no rebound.  Musculoskeletal: She exhibits no edema.  Neurological: She is alert.  Only responds "what" Appears to move all 4 extremities spontaneously, does not follow commands  Skin: Skin is warm and dry.  Nursing note and vitals reviewed.   ED Course  Procedures (including critical care time)  CRITICAL CARE Performed by: Merryl Hacker   Total critical care time: 60 minutes  Critical care time was exclusive of separately billable procedures and treating other  patients.  Critical care was necessary to treat or prevent imminent or life-threatening deterioration.  Critical care was time spent personally by me on the following activities: development of treatment plan with patient and/or surrogate as well as nursing, discussions with consultants, evaluation of patient's response to treatment, examination of patient, obtaining history from patient or surrogate, ordering and performing treatments and interventions, ordering and review of laboratory studies, ordering and review of radiographic studies, pulse oximetry and re-evaluation of patient's condition.  DIAGNOSTIC STUDIES: Oxygen Saturation is 96% on RA, adequate by my interpretation.    COORDINATION OF CARE: 11:48 PM Discussed treatment plan with daughter at bedside which includes lab work and she agreed to plan.   Labs Review Labs Reviewed  COMPREHENSIVE METABOLIC PANEL - Abnormal; Notable for the following:  Sodium 134 (*)    Chloride 100 (*)    CO2 20 (*)    Glucose, Bld 600 (*)    BUN 45 (*)    Creatinine, Ser 3.64 (*)    Total Protein 8.3 (*)    AST 58 (*)    GFR calc non Af Amer 11 (*)    GFR calc Af Amer 12 (*)    All other components within normal limits  CBC WITH DIFFERENTIAL/PLATELET - Abnormal; Notable for the following:    WBC 16.7 (*)    RBC 3.40 (*)    Hemoglobin 10.7 (*)    HCT 30.8 (*)    Neutro Abs 14.6 (*)    Monocytes Absolute 1.3 (*)    All other components within normal limits  URINALYSIS, ROUTINE W REFLEX MICROSCOPIC (NOT AT [facility]) - Abnormal; Notable for the following:    Glucose, UA >1000 (*)    Hgb urine dipstick SMALL (*)    Protein, ur 100 (*)    All other components within normal limits  URINE MICROSCOPIC-ADD ON - Abnormal; Notable for the following:    Squamous Epithelial / LPF 0-5 (*)    Bacteria, UA RARE (*)    All other components within normal limits  CBG MONITORING, ED - Abnormal; Notable for the following:    Glucose-Capillary 541 (*)    All  other components within normal limits  CBG MONITORING, ED - Abnormal; Notable for the following:    Glucose-Capillary 488 (*)    All other components within normal limits  I-STAT CG4 LACTIC ACID, ED - Abnormal; Notable for the following:    Lactic Acid, Venous 2.61 (*)    All other components within normal limits  CBG MONITORING, ED - Abnormal; Notable for the following:    Glucose-Capillary 457 (*)    All other components within normal limits  I-STAT CG4 LACTIC ACID, ED - Abnormal; Notable for the following:    Lactic Acid, Venous 4.02 (*)    All other components within normal limits  I-STAT TROPOININ, ED - Abnormal; Notable for the following:    Troponin i, poc 9.04 (*)    All other components within normal limits  CBG MONITORING, ED - Abnormal; Notable for the following:    Glucose-Capillary 383 (*)    All other components within normal limits  CULTURE, BLOOD (ROUTINE X 2)  CULTURE, BLOOD (ROUTINE X 2)  URINE CULTURE    Imaging Review Dg Chest 2 View  06/25/2015  CLINICAL DATA:  Acute onset of shaking chills and vomiting. Hyperglycemia. Initial encounter. EXAM: CHEST  2 VIEW COMPARISON:  Chest radiograph performed 11/27/2014 FINDINGS: The lungs are mildly hypoexpanded. Vascular congestion is noted. Bibasilar airspace opacities may reflect mild interstitial edema or possibly pneumonia. No pleural effusion or pneumothorax is seen. The cardiomediastinal silhouette remains normal in size. No acute osseous abnormalities are identified. IMPRESSION: Lungs mildly hypoexpanded. Vascular congestion noted. Bibasilar airspace opacities may reflect mild interstitial edema or possibly pneumonia. Follow-up PA and lateral chest radiograph would be helpful after completion of treatment, to exclude an underlying mass. Electronically Signed   By: Garald Balding M.D.   On: 06/25/2015 02:24   I have personally reviewed and evaluated these images and lab results as part of my medical decision-making.    EKG Interpretation   Date/Time:  Saturday June 24 2015 23:55:14 EDT Ventricular Rate:  123 PR Interval:  48 QRS Duration: 108 QT Interval:  414 QTC Calculation: 592 R Axis:   -42 Text  Interpretation:  Sinus tachycardia Probable left atrial enlargement  LVH with secondary repolarization abnormality Prolonged QT intervals T  wave inversions diffusely Confirmed by Anneli Bing  MD, Ines Rebel ([id number]) on  06/25/2015 2:24:10 AM     Medications  sodium chloride 0.9 % bolus 1,000 mL (1,000 mLs Intravenous Not Given 06/25/15 0354)  dextrose 5 %-0.45 % sodium chloride infusion ( Intravenous Hold 06/25/15 0354)  insulin regular (NOVOLIN R,HUMULIN R) 250 Units in sodium chloride 0.9 % 250 mL (1 Units/mL) infusion (6.5 Units/hr Intravenous Rate/Dose Change 06/25/15 0413)  aspirin chewable tablet 324 mg (not administered)  heparin ADULT infusion 100 units/mL (25000 units/250 mL) (not administered)  heparin bolus via infusion 2,500 Units (not administered)  piperacillin-tazobactam (ZOSYN) IVPB 3.375 g (0 g Intravenous Stopped 06/25/15 0413)  vancomycin (VANCOCIN) IVPB 1000 mg/200 mL premix (0 mg Intravenous Stopped 06/25/15 0344)    MDM   Final diagnoses:  Severe sepsis ([place])  NSTEMI (non-ST elevated myocardial infarction) ([place])  Hyperglycemia    Patient presents with increasing altered mental status and hyperglycemia from home. She is noncontributory to history taking. Vital signs notable for tachycardia to 128. Rectal temperature 99.3.  She is ill-appearing. Sepsis workup initiated. EKG shows tachycardia with diffuse T-wave inversions. Laboratory notable for a white count of 16.  Lactate is 2.61. Patient was given 2 L of fluid. She does have a history of ischemic cardiomyopathy.  Last echo in 2013 with an EF of 50%.  Given vein and Zosyn for unknown source. Urinalysis reassuring. Chest x-ray shows concern for pneumonia. Patient also notably hyperglycemic. She was placed on glucose stabilizer.  Given  diffuse T-wave inversions, which is a change from prior, troponin was obtained. It is 9.04. Patient was given aspirin.  Consult cardiology. Family is no longer at the bedside to discuss further interventions.  Discussed with internal medicine resident. Will admit to the step down unit for the time being and treat for sepsis. Patient would likely benefit from further discussions regarding goals of care.  Discussed with cardiology fellow on call, Dr. Eula Fried.  Medical management at this time. Patient would likely not be a catheterization candidate. Again, goals of care are important regarding this.  I personally performed the services described in this documentation, which was scribed in my presence. The recorded information has been reviewed and is accurate.   Merryl Hacker, MD 06/25/15 [phone]

## 2015-06-24 NOTE — ED Notes (Signed)
EKG given to EDP,Campos,MD., for review. 

## 2015-06-25 ENCOUNTER — Encounter (HOSPITAL_COMMUNITY): Payer: Self-pay | Admitting: Certified Registered Nurse Anesthetist

## 2015-06-25 ENCOUNTER — Emergency Department (HOSPITAL_COMMUNITY)

## 2015-06-25 DIAGNOSIS — I255 Ischemic cardiomyopathy: Secondary | ICD-10-CM | POA: Diagnosis not present

## 2015-06-25 DIAGNOSIS — Z515 Encounter for palliative care: Secondary | ICD-10-CM | POA: Diagnosis present

## 2015-06-25 DIAGNOSIS — J189 Pneumonia, unspecified organism: Secondary | ICD-10-CM | POA: Diagnosis not present

## 2015-06-25 DIAGNOSIS — I10 Essential (primary) hypertension: Secondary | ICD-10-CM | POA: Diagnosis not present

## 2015-06-25 DIAGNOSIS — I214 Non-ST elevation (NSTEMI) myocardial infarction: Secondary | ICD-10-CM

## 2015-06-25 DIAGNOSIS — R0902 Hypoxemia: Secondary | ICD-10-CM | POA: Diagnosis present

## 2015-06-25 DIAGNOSIS — Z794 Long term (current) use of insulin: Secondary | ICD-10-CM | POA: Diagnosis not present

## 2015-06-25 DIAGNOSIS — N184 Chronic kidney disease, stage 4 (severe): Secondary | ICD-10-CM | POA: Diagnosis not present

## 2015-06-25 DIAGNOSIS — E1165 Type 2 diabetes mellitus with hyperglycemia: Secondary | ICD-10-CM

## 2015-06-25 DIAGNOSIS — Z79899 Other long term (current) drug therapy: Secondary | ICD-10-CM | POA: Diagnosis not present

## 2015-06-25 DIAGNOSIS — E1122 Type 2 diabetes mellitus with diabetic chronic kidney disease: Secondary | ICD-10-CM | POA: Diagnosis present

## 2015-06-25 DIAGNOSIS — I701 Atherosclerosis of renal artery: Secondary | ICD-10-CM | POA: Diagnosis present

## 2015-06-25 DIAGNOSIS — F0390 Unspecified dementia without behavioral disturbance: Secondary | ICD-10-CM

## 2015-06-25 DIAGNOSIS — N179 Acute kidney failure, unspecified: Secondary | ICD-10-CM | POA: Diagnosis not present

## 2015-06-25 DIAGNOSIS — Z8673 Personal history of transient ischemic attack (TIA), and cerebral infarction without residual deficits: Secondary | ICD-10-CM | POA: Diagnosis not present

## 2015-06-25 DIAGNOSIS — I25119 Atherosclerotic heart disease of native coronary artery with unspecified angina pectoris: Secondary | ICD-10-CM | POA: Diagnosis not present

## 2015-06-25 DIAGNOSIS — Z90710 Acquired absence of both cervix and uterus: Secondary | ICD-10-CM | POA: Diagnosis not present

## 2015-06-25 DIAGNOSIS — R739 Hyperglycemia, unspecified: Secondary | ICD-10-CM | POA: Diagnosis present

## 2015-06-25 DIAGNOSIS — I2510 Atherosclerotic heart disease of native coronary artery without angina pectoris: Secondary | ICD-10-CM | POA: Diagnosis not present

## 2015-06-25 DIAGNOSIS — E785 Hyperlipidemia, unspecified: Secondary | ICD-10-CM | POA: Diagnosis not present

## 2015-06-25 DIAGNOSIS — I130 Hypertensive heart and chronic kidney disease with heart failure and stage 1 through stage 4 chronic kidney disease, or unspecified chronic kidney disease: Secondary | ICD-10-CM | POA: Diagnosis present

## 2015-06-25 DIAGNOSIS — Z955 Presence of coronary angioplasty implant and graft: Secondary | ICD-10-CM | POA: Diagnosis not present

## 2015-06-25 DIAGNOSIS — A419 Sepsis, unspecified organism: Secondary | ICD-10-CM | POA: Diagnosis not present

## 2015-06-25 DIAGNOSIS — I129 Hypertensive chronic kidney disease with stage 1 through stage 4 chronic kidney disease, or unspecified chronic kidney disease: Secondary | ICD-10-CM | POA: Diagnosis not present

## 2015-06-25 DIAGNOSIS — I5042 Chronic combined systolic (congestive) and diastolic (congestive) heart failure: Secondary | ICD-10-CM | POA: Diagnosis present

## 2015-06-25 DIAGNOSIS — R6520 Severe sepsis without septic shock: Secondary | ICD-10-CM | POA: Diagnosis present

## 2015-06-25 DIAGNOSIS — Z7902 Long term (current) use of antithrombotics/antiplatelets: Secondary | ICD-10-CM | POA: Diagnosis not present

## 2015-06-25 DIAGNOSIS — E1310 Other specified diabetes mellitus with ketoacidosis without coma: Secondary | ICD-10-CM | POA: Diagnosis present

## 2015-06-25 DIAGNOSIS — N186 End stage renal disease: Secondary | ICD-10-CM

## 2015-06-25 DIAGNOSIS — I120 Hypertensive chronic kidney disease with stage 5 chronic kidney disease or end stage renal disease: Secondary | ICD-10-CM

## 2015-06-25 DIAGNOSIS — Z66 Do not resuscitate: Secondary | ICD-10-CM | POA: Diagnosis present

## 2015-06-25 DIAGNOSIS — Z6822 Body mass index (BMI) 22.0-22.9, adult: Secondary | ICD-10-CM | POA: Diagnosis not present

## 2015-06-25 LAB — CBC WITH DIFFERENTIAL/PLATELET
Basophils Absolute: 0 10*3/uL (ref 0.0–0.1)
Basophils Relative: 0 %
EOS PCT: 0 %
Eosinophils Absolute: 0 10*3/uL (ref 0.0–0.7)
HCT: 30.8 % — ABNORMAL LOW (ref 36.0–46.0)
Hemoglobin: 10.7 g/dL — ABNORMAL LOW (ref 12.0–15.0)
LYMPHS ABS: 0.8 10*3/uL (ref 0.7–4.0)
LYMPHS PCT: 5 %
MCH: 31.5 pg (ref 26.0–34.0)
MCHC: 34.7 g/dL (ref 30.0–36.0)
MCV: 90.6 fL (ref 78.0–100.0)
MONO ABS: 1.3 10*3/uL — AB (ref 0.1–1.0)
MONOS PCT: 8 %
Neutro Abs: 14.6 10*3/uL — ABNORMAL HIGH (ref 1.7–7.7)
Neutrophils Relative %: 87 %
PLATELETS: 392 10*3/uL (ref 150–400)
RBC: 3.4 MIL/uL — ABNORMAL LOW (ref 3.87–5.11)
RDW: 11.9 % (ref 11.5–15.5)
WBC: 16.7 10*3/uL — ABNORMAL HIGH (ref 4.0–10.5)

## 2015-06-25 LAB — URINE MICROSCOPIC-ADD ON

## 2015-06-25 LAB — COMPREHENSIVE METABOLIC PANEL
ALT: 18 U/L (ref 14–54)
AST: 58 U/L — AB (ref 15–41)
Albumin: 4 g/dL (ref 3.5–5.0)
Alkaline Phosphatase: 110 U/L (ref 38–126)
Anion gap: 14 (ref 5–15)
BILIRUBIN TOTAL: 0.8 mg/dL (ref 0.3–1.2)
BUN: 45 mg/dL — AB (ref 6–20)
CO2: 20 mmol/L — ABNORMAL LOW (ref 22–32)
Calcium: 9.6 mg/dL (ref 8.9–10.3)
Chloride: 100 mmol/L — ABNORMAL LOW (ref 101–111)
Creatinine, Ser: 3.64 mg/dL — ABNORMAL HIGH (ref 0.44–1.00)
GFR calc Af Amer: 12 mL/min — ABNORMAL LOW (ref >60)
GFR calc non Af Amer: 11 mL/min — ABNORMAL LOW (ref >60)
GLUCOSE: 600 mg/dL — AB (ref 65–99)
POTASSIUM: 4.2 mmol/L (ref 3.5–5.1)
Sodium: 134 mmol/L — ABNORMAL LOW (ref 135–145)
TOTAL PROTEIN: 8.3 g/dL — AB (ref 6.5–8.1)

## 2015-06-25 LAB — URINALYSIS, ROUTINE W REFLEX MICROSCOPIC
Bilirubin Urine: NEGATIVE
KETONES UR: NEGATIVE mg/dL
Leukocytes, UA: NEGATIVE
Nitrite: NEGATIVE
PROTEIN: 100 mg/dL — AB
Specific Gravity, Urine: 1.016 (ref 1.005–1.030)
pH: 7 (ref 5.0–8.0)

## 2015-06-25 LAB — GLUCOSE, CAPILLARY
Glucose-Capillary: 135 mg/dL — ABNORMAL HIGH (ref 65–99)
Glucose-Capillary: 90 mg/dL (ref 65–99)
Glucose-Capillary: 103 mg/dL — ABNORMAL HIGH (ref 65–99)
Glucose-Capillary: 274 mg/dL — ABNORMAL HIGH (ref 65–99)

## 2015-06-25 LAB — CBC
HCT: 31.1 % — ABNORMAL LOW (ref 36.0–46.0)
Hemoglobin: 10.9 g/dL — ABNORMAL LOW (ref 12.0–15.0)
MCH: 31.3 pg (ref 26.0–34.0)
MCHC: 35 g/dL (ref 30.0–36.0)
MCV: 89.4 fL (ref 78.0–100.0)
PLATELETS: 355 10*3/uL (ref 150–400)
RBC: 3.48 MIL/uL — ABNORMAL LOW (ref 3.87–5.11)
RDW: 11.8 % (ref 11.5–15.5)
WBC: 24.6 10*3/uL — AB (ref 4.0–10.5)

## 2015-06-25 LAB — APTT: aPTT: 27 seconds (ref 24–37)

## 2015-06-25 LAB — I-STAT CG4 LACTIC ACID, ED
LACTIC ACID, VENOUS: 2.61 mmol/L — AB (ref 0.5–2.0)
LACTIC ACID, VENOUS: 4.02 mmol/L — AB (ref 0.5–2.0)

## 2015-06-25 LAB — I-STAT TROPONIN, ED: TROPONIN I, POC: 9.04 ng/mL — AB (ref 0.00–0.08)

## 2015-06-25 LAB — PROTIME-INR
INR: 0.99 (ref 0.00–1.49)
Prothrombin Time: 13.3 seconds (ref 11.6–15.2)

## 2015-06-25 LAB — TROPONIN I: Troponin I: 17.02 ng/mL (ref <0.031)

## 2015-06-25 LAB — CBG MONITORING, ED
GLUCOSE-CAPILLARY: 383 mg/dL — AB (ref 65–99)
GLUCOSE-CAPILLARY: 156 mg/dL — AB (ref 65–99)
Glucose-Capillary: 488 mg/dL — ABNORMAL HIGH (ref 65–99)
Glucose-Capillary: 457 mg/dL — ABNORMAL HIGH (ref 65–99)
Glucose-Capillary: 267 mg/dL — ABNORMAL HIGH (ref 65–99)
Glucose-Capillary: 210 mg/dL — ABNORMAL HIGH (ref 65–99)

## 2015-06-25 LAB — LACTIC ACID, PLASMA
LACTIC ACID, VENOUS: 2.4 mmol/L — AB (ref 0.5–2.0)
Lactic Acid, Venous: 1.7 mmol/L (ref 0.5–2.0)

## 2015-06-25 LAB — HEPARIN LEVEL (UNFRACTIONATED)
HEPARIN UNFRACTIONATED: 0.42 [IU]/mL (ref 0.30–0.70)
Heparin Unfractionated: 0.39 IU/mL (ref 0.30–0.70)

## 2015-06-25 LAB — PROCALCITONIN: PROCALCITONIN: 0.75 ng/mL

## 2015-06-25 LAB — MRSA PCR SCREENING: MRSA by PCR: NEGATIVE

## 2015-06-25 MED ORDER — SODIUM CHLORIDE 0.9 % IV SOLN
INTRAVENOUS | Status: DC
  Administered 2015-06-25: 4.3 [IU]/h via INTRAVENOUS
  Filled 2015-06-25: qty 2.5

## 2015-06-25 MED ORDER — INSULIN GLARGINE 100 UNIT/ML ~~LOC~~ SOLN
5.0000 [IU] | Freq: Every day | SUBCUTANEOUS | Status: DC
  Administered 2015-06-25 – 2015-06-28 (×3): 5 [IU] via SUBCUTANEOUS
  Filled 2015-06-25 (×5): qty 0.05

## 2015-06-25 MED ORDER — PIPERACILLIN-TAZOBACTAM 3.375 G IVPB
3.3750 g | Freq: Once | INTRAVENOUS | Status: AC
  Administered 2015-06-25: 3.375 g via INTRAVENOUS
  Filled 2015-06-25: qty 50

## 2015-06-25 MED ORDER — VANCOMYCIN HCL IN DEXTROSE 1-5 GM/200ML-% IV SOLN
1000.0000 mg | Freq: Once | INTRAVENOUS | Status: AC
  Administered 2015-06-25: 1000 mg via INTRAVENOUS
  Filled 2015-06-25: qty 200

## 2015-06-25 MED ORDER — VANCOMYCIN HCL IN DEXTROSE 750-5 MG/150ML-% IV SOLN: 750.0000 mg | INTRAVENOUS | Status: DC

## 2015-06-25 MED ORDER — ATORVASTATIN CALCIUM 80 MG PO TABS
80.0000 mg | ORAL_TABLET | Freq: Every day | ORAL | Status: DC
  Administered 2015-06-25 – 2015-06-27 (×3): 80 mg via ORAL
  Filled 2015-06-25 (×3): qty 1

## 2015-06-25 MED ORDER — DEXTROSE-NACL 5-0.45 % IV SOLN
INTRAVENOUS | Status: DC
  Administered 2015-06-25 (×2): via INTRAVENOUS
  Administered 2015-06-26: 100 mL via INTRAVENOUS

## 2015-06-25 MED ORDER — METOPROLOL TARTRATE 25 MG PO TABS
25.0000 mg | ORAL_TABLET | Freq: Two times a day (BID) | ORAL | Status: DC
  Administered 2015-06-25 – 2015-06-27 (×5): 25 mg via ORAL
  Filled 2015-06-25 (×5): qty 1

## 2015-06-25 MED ORDER — HEPARIN BOLUS VIA INFUSION: 2500.0000 [IU] | Freq: Once | INTRAVENOUS | Status: DC

## 2015-06-25 MED ORDER — ASPIRIN EC 81 MG PO TBEC
81.0000 mg | DELAYED_RELEASE_TABLET | Freq: Every day | ORAL | Status: DC
  Administered 2015-06-26 – 2015-06-28 (×3): 81 mg via ORAL
  Filled 2015-06-25 (×3): qty 1

## 2015-06-25 MED ORDER — HEPARIN BOLUS VIA INFUSION
2500.0000 [IU] | Freq: Once | INTRAVENOUS | Status: AC
  Administered 2015-06-25: 2500 [IU] via INTRAVENOUS
  Filled 2015-06-25: qty 2500

## 2015-06-25 MED ORDER — HEPARIN (PORCINE) IN NACL 100-0.45 UNIT/ML-% IJ SOLN
700.0000 [IU]/h | INTRAMUSCULAR | Status: DC
  Administered 2015-06-25: 700 [IU]/h via INTRAVENOUS
  Filled 2015-06-25: qty 250

## 2015-06-25 MED ORDER — PIPERACILLIN-TAZOBACTAM IN DEX 2-0.25 GM/50ML IV SOLN
2.2500 g | Freq: Three times a day (TID) | INTRAVENOUS | Status: DC
Start: 2015-06-25 — End: 2015-06-26
  Administered 2015-06-25 – 2015-06-26 (×3): 2.25 g via INTRAVENOUS
  Filled 2015-06-25 (×4): qty 50

## 2015-06-25 MED ORDER — HEPARIN (PORCINE) IN NACL 100-0.45 UNIT/ML-% IJ SOLN
800.0000 [IU]/h | INTRAMUSCULAR | Status: AC
  Administered 2015-06-25: 700 [IU]/h via INTRAVENOUS
  Filled 2015-06-25: qty 250

## 2015-06-25 MED ORDER — ASPIRIN 81 MG PO CHEW
324.0000 mg | CHEWABLE_TABLET | Freq: Once | ORAL | Status: AC
  Administered 2015-06-25: 324 mg via ORAL
  Filled 2015-06-25: qty 4

## 2015-06-25 MED ORDER — CETYLPYRIDINIUM CHLORIDE 0.05 % MT LIQD
7.0000 mL | Freq: Two times a day (BID) | OROMUCOSAL | Status: DC
  Administered 2015-06-25 – 2015-06-28 (×7): 7 mL via OROMUCOSAL

## 2015-06-25 MED ORDER — CLOPIDOGREL BISULFATE 75 MG PO TABS
75.0000 mg | ORAL_TABLET | Freq: Every day | ORAL | Status: DC
  Administered 2015-06-25 – 2015-06-28 (×4): 75 mg via ORAL
  Filled 2015-06-25 (×4): qty 1

## 2015-06-25 MED ORDER — INSULIN ASPART 100 UNIT/ML ~~LOC~~ SOLN
0.0000 [IU] | SUBCUTANEOUS | Status: DC
  Administered 2015-06-25: 7 [IU] via SUBCUTANEOUS
  Administered 2015-06-25: 5 [IU] via SUBCUTANEOUS
  Administered 2015-06-26: 2 [IU] via SUBCUTANEOUS
  Administered 2015-06-26: 3 [IU] via SUBCUTANEOUS
  Administered 2015-06-26 (×2): 2 [IU] via SUBCUTANEOUS
  Administered 2015-06-26: 3 [IU] via SUBCUTANEOUS
  Administered 2015-06-26: 2 [IU] via SUBCUTANEOUS
  Administered 2015-06-27: 5 [IU] via SUBCUTANEOUS
  Administered 2015-06-27: 1 [IU] via SUBCUTANEOUS
  Administered 2015-06-27: 5 [IU] via SUBCUTANEOUS
  Administered 2015-06-28: 2 [IU] via SUBCUTANEOUS
  Administered 2015-06-28 (×3): 3 [IU] via SUBCUTANEOUS

## 2015-06-25 NOTE — ED Notes (Signed)
Received call from lab with critical blood glucose of 600.

## 2015-06-25 NOTE — Progress Notes (Signed)
ANTICOAGULATION CONSULT NOTE - Follow Up Consult  Pharmacy Consult for Heparin Indication: chest pain/ACS  No Known Allergies  Patient Measurements: Height: 5\' 4"  (162.6 cm) Weight: 123 lb 14.4 oz (56.2 kg) IBW/kg (Calculated) : 54.7 Heparin Dosing Weight:  54.7 kg  Vital Signs: Temp: 98.9 F (37.2 C) (04/16 1940) Temp Source: Oral (04/16 1940) BP: 148/81 mmHg (04/16 1900) Pulse Rate: 94 (04/16 1900)  Labs:  Recent Labs  06/25/15 0018 06/25/15 0509 06/25/15 1055 06/25/15 1806  HGB 10.7* 10.9*  --   --   HCT 30.8* 31.1*  --   --   PLT 392 355  --   --   APTT  --  27  --   --   LABPROT  --  13.3  --   --   INR  --  0.99  --   --   HEPARINUNFRC  --   --  0.39 0.42  CREATININE 3.64*  --   --   --   TROPONINI  --   --  17.02*  --     Estimated Creatinine Clearance: 10.5 mL/min (by C-G formula based on Cr of 3.64).  Assessment:  82 y/o hospice patient presents with hyperglycemia and  AMS at home. Daughter reports CBGs near 600 despite increasing insulin doses. Abx for suspected UTI 4/14. Sats 88%. Elevated troponin  PMHx of HTN, HLD, DM2, CKD stage IV, CVA, dementia, ischemic cardiomyopathy, CAD s/p PTCA in 07/2003, sundowner's, multiple cardiac arrests.  Anticoagulation: r/o cardiac ischemia. Heparin was apparently running at 2500 units/hr for several hours at [facility]?? instead of the 2500 unit BOLUS?? (first charted 0510) according to the RN that got report. Heparin was stopped by EMS upon transport of patient to [facility]. Arrived around 0900. Check STAT HL=0.39. Resume heparin at previous rate of 700 units/hr and check in 6 hrs.  Goal of Therapy:  Heparin level 0.3-0.7 units/ml Monitor platelets by anticoagulation protocol: Yes   Plan:  Resume heparin at previous rate of 700 units/hr Check heparin level in 6 hrs.   Crystal S. Alford Highland, PharmD, BCPS Clinical Staff Pharmacist Pager [phone]   Addendum -Heparin level therapeutic -Continue current rate -Next level with  AM labs   Harvel Quale  06/25/2015 8:15 PM

## 2015-06-25 NOTE — Progress Notes (Signed)
ANTICOAGULATION CONSULT NOTE - Follow Up Consult  Pharmacy Consult for Heparin Indication: chest pain/ACS  No Known Allergies  Patient Measurements: Height: 5\' 4"  (162.6 cm) Weight: 123 lb 14.4 oz (56.2 kg) IBW/kg (Calculated) : 54.7 Heparin Dosing Weight:  54.7 kg  Vital Signs: Temp: 98.6 F (37 C) (04/16 0900) Temp Source: Oral (04/16 0900) BP: 149/90 mmHg (04/16 1100) Pulse Rate: 105 (04/16 1100)  Labs:  Recent Labs  06/25/15 0018 06/25/15 0509 06/25/15 1055  HGB 10.7* 10.9*  --   HCT 30.8* 31.1*  --   PLT 392 355  --   APTT  --  27  --   LABPROT  --  13.3  --   INR  --  0.99  --   HEPARINUNFRC  --   --  0.39  CREATININE 3.64*  --   --   TROPONINI  --   --  17.02*    Estimated Creatinine Clearance: 10.5 mL/min (by C-G formula based on Cr of 3.64).  Assessment:  82 y/o hospice patient presents with hyperglycemia and  AMS at home. Daughter reports CBGs near 600 despite increasing insulin doses. Abx for suspected UTI 4/14. Sats 88%. Elevated troponin  PMHx of HTN, HLD, DM2, CKD stage IV, CVA, dementia, ischemic cardiomyopathy, CAD s/p PTCA in 07/2003, sundowner's, multiple cardiac arrests.  Anticoagulation: r/o cardiac ischemia. Heparin was apparently running at 2500 units/hr for several hours at [facility]?? instead of the 2500 unit BOLUS?? (first charted 0510) according to the RN that got report. Heparin was stopped by EMS upon transport of patient to [facility]. Arrived around 0900. Check STAT HL=0.39. Resume heparin at previous rate of 700 units/hr and check in 6 hrs.  Goal of Therapy:  Heparin level 0.3-0.7 units/ml Monitor platelets by anticoagulation protocol: Yes   Plan:  Resume heparin at previous rate of 700 units/hr Check heparin level in 6 hrs.   Kayna Suppa S. Alford Highland, PharmD, BCPS Clinical Staff Pharmacist Pager [phone]  Eilene Ghazi Stillinger 06/25/2015,11:57 AM

## 2015-06-25 NOTE — Plan of Care (Signed)
Problem: Education: Goal: Knowledge of [place] [facility] Education information/materials will improve Outcome: Progressing Pt continues to be minimally responsive. Family updated on general education

## 2015-06-25 NOTE — Progress Notes (Signed)
Tan patch removed from pt back.

## 2015-06-25 NOTE — ED Notes (Signed)
Attempted 2nd IV site x 2 without success.  Requested Amie, Agricultural consultant attempt.

## 2015-06-25 NOTE — Progress Notes (Signed)
CRITICAL VALUE ALERT  Critical value received:  Lactic Acid 2.4 trop 17.02  Date of notification:  06/25/2015   Time of notification:  [id number]  Critical value read back:Yes.    Nurse who received alert:  Rosebud Poles RN  MD notified (1st page):  Rathore MD  Time of first page:  [phone]  Responding MD:  Marlowe Sax  Time MD responded:  1153

## 2015-06-25 NOTE — H&P (Signed)
Date: 06/25/2015               Patient Name:  [name] MRN: [id number]  DOB: [birth year] Age / Sex: 82 y.o., female   PCP: Milagros Loll, MD         Medical Service: Internal Medicine Teaching Service         Attending Physician: Dr. Bartholomew Crews, MD    First Contact: Dr. Marlowe Sax Pager: [phone]  Second Contact: Dr. Arcelia Jew Pager: [phone]       After Hours (After 5p/  First Contact Pager: [phone]  weekends / holidays): Second Contact Pager: [phone]   Chief Complaint:   History of Present Illness: Patient is a 82 yo F with a PMHx of HTN, HLD, DM2, CKD stage IV, CVA, dementia, ischemic cardiomyopathy, CAD s/p PTCA in 07/2003 being transferred from [facility] ED to [facility]. As per documentation from [facility], patient was brought into the hospital by [facility] for hyperglycemia and increased AMS at home. She is a hospice patient, has dementia, renal failure, and has been in cardiac arrest and resuscitated multiple times in the past. She was found to be satting 88% on RA and was placed on 4L O2 via [place]. O2 sats improved to 90%. Vitals notable for tachycardia to 128 and rectal temp 99.3.  Labs notable for CBG 541, white count 16.7 and lactate 2.61. She was given 2L of fluid. UA was reassuring. CXR showd concern for PNA. She as started on Vanc and Zosyn. EKG showed tachycardia with diffuse T wave inversions; change from prior. Troponin 9.04. She was given Aspirin. ED provider spoke to cardiology fellow Dr. Eula Fried who recommended medical management at this time as patient would likely not be a catherization candidate.   When we went to see the patient, daughter was in the room. States patient lives with her and hospice nurse visits. She noticed patient's sugar was in the 400s on Friday (2 days ago) and she was vomiting, had "puffy eyes," "big lips," and diarrhea. The day prior, she thought the patient had common cold symptoms as her voice was changing. States she called Hospice and was  instructed either to give more insulin or bring the patient into the hospital. As per daughter, patient is conversive and ambulatory at baseline. She is normally oriented to person only due to history of dementia. Now daughter believes patient is not verbally responsive but is able to follow commands. Denies any fevers and states she is not sure about chills because patient feels cold all the time. States her appetite was good until all these symptoms started. Denies patient complaining of any CP, SOB, cough, wheezing, or dysuria. No recent travel or calf pain/ swelling. Denies any hematemesis, hematochezia, or melena.   Patient's hospice nurse Clifton James later called me and provided me with some additional information. She was concerned that patient was not receiving her BP medications at home including clonidine. States family was checking patient's blood sugars with a glucometer that had an error message on it. States family reported patient having foul/ pungent smelling urine, as such, patient's PCP prescribed antibiotics yesterday to treat possible UTI.     Meds: Current Facility-Administered Medications  Medication Dose Route Frequency Provider Last Rate Last Dose  . dextrose 5 %-0.45 % sodium chloride infusion   Intravenous Continuous Merryl Hacker, MD 100 mL/hr at 06/25/15 0629    . heparin ADULT infusion 100 units/mL (25000 units/250 mL)  700 Units/hr Intravenous Continuous Bartholomew Crews,  MD   Stopped at 06/25/15 0509  . insulin regular (NOVOLIN R,HUMULIN R) 250 Units in sodium chloride 0.9 % 250 mL (1 Units/mL) infusion   Intravenous Continuous Merryl Hacker, MD 0.9 mL/hr at 06/25/15 0749 0.9 Units/hr at 06/25/15 [id number]   Current Outpatient Prescriptions  Medication Sig Dispense Refill  . amLODipine (NORVASC) 10 MG tablet TAKE 1 TABLET BY MOUTH EVERY DAY 90 tablet 3  . BD INSULIN SYRINGE ULTRAFINE 31G X 5/16" 0.3 ML MISC 1 SYRINGE BY DOES NOT APPLY ROUTE 2 (TWO) TIMES DAILY. 60 each  3  . ciprofloxacin (CIPRO) 250 MG tablet Take 250 mg by mouth 2 (two) times daily.    . cloNIDine (CATAPRES - DOSED IN MG/24 HR) 0.2 mg/24hr patch PLACE 1 PATCH (0.2 MG TOTAL) ONTO THE SKIN ONCE A WEEK. 4 patch 3  . clopidogrel (PLAVIX) 75 MG tablet Take 1 tablet (75 mg total) by mouth daily. 90 tablet 4  . CVS IBUPROFEN 200 MG tablet Take 200 mg by mouth every 4 (four) hours as needed. for pain  2  . CVS SENNA PLUS 8.6-50 MG tablet Take 1 tablet by mouth 2 (two) times daily as needed. For constipation  2  . furosemide (LASIX) 40 MG tablet TAKE 1 AND 1/2 TABLETS (60 MG TOTAL) BY MOUTH DAILY. 90 tablet 3  . glucose blood (ACCU-CHEK AVIVA PLUS) test strip Use to test blood glucose 3 times daily. Dx: E13.9 100 each 12  . hydrALAZINE (APRESOLINE) 25 MG tablet TAKE 1 TABLET (25 MG TOTAL) BY MOUTH 2 (TWO) TIMES DAILY. 60 tablet 3  . hydrOXYzine (ATARAX/VISTARIL) 10 MG tablet Take 10 mg by mouth every 6 (six) hours as needed. For itching  1  . Insulin Isophane & Regular Human (HUMULIN 70/30 KWIKPEN) (70-30) 100 UNIT/ML PEN Inject subcutaneously 15 units in the morning and 6 units in the evening. 15 mL 2  . Insulin Pen Needle (ULTRA-THIN II MINI PEN NEEDLE) 31G X 5 MM MISC Use to inject insulin twice a day. Dx code: E13.9 100 each 2  . metoprolol succinate (TOPROL-XL) 50 MG 24 hr tablet TAKE 1 TABLET BY MOUTH EVERY DAY WITH OR IMMEDIATELY FOLLOWING A MEAL 90 tablet 3  . temazepam (RESTORIL) 15 MG capsule Take 15 mg by mouth at bedtime as needed and may repeat dose one time if needed. For sleep.  1    Allergies: Allergies as of 06/24/2015  . (No Known Allergies)   Past Medical History  Diagnosis Date  . Hyperlipidemia   . Hypertension   . Diabetes mellitus   . Chronic kidney disease     stage IV  . RAS (renal artery stenosis) ([place])   . Stroke [facility])     s/p CVA unknown timing  . Ischemic cardiomyopathy     EF 20%  . Dementia   . CAD (coronary artery disease)     s/p PTCA May 2005, stenting of  an intermittent blanch of the CMX and the angioplasty of teh diagonal branch to the left descending  . Cardiomyopathy, ischemic    Past Surgical History  Procedure Laterality Date  . Appendectomy    . Total abdominal hysterectomy w/ bilateral salpingoophorectomy    . Angioplasty      and stent   Family History  Problem Relation Age of Onset  . Kidney disease Brother    Social History   Social History  . Marital Status: Married    Spouse Name: N/A  . Number of Children: N/A  .  Years of Education: N/A   Occupational History  . Not on file.   Social History Main Topics  . Smoking status: Never Smoker   . Smokeless tobacco: Never Used  . Alcohol Use: No  . Drug Use: No  . Sexual Activity: Not on file   Other Topics Concern  . Not on file   Social History Narrative   Patient gets regular exercise- walking   Lives with husband and son; son manages medications      No FH of colon cancer    Review of Systems: Review of Systems  Unable to perform ROS: patient nonverbal   Physical Exam: Blood pressure 153/102, pulse 108, temperature 99.2 F (37.3 C), temperature source Rectal, resp. rate 25, height 5\' 4"  (1.626 m), weight 58.514 kg (129 lb), SpO2 100 %. Physical Exam  Constitutional:  Elderly African American female lying comfortably in hospital bed.   HENT:  Head: Normocephalic and atraumatic.  Neck: Neck supple. No tracheal deviation present.  Cardiovascular: Normal rate, regular rhythm and intact distal pulses.   Pulmonary/Chest:  Diffuse rhonchi appreciated on ausculation of the anterior lung fields. Posterior lung fields could not be auscultated as patient was not able to cooperate.   Abdominal: Soft. She exhibits no distension. There is no tenderness. There is no rebound and no guarding.  Hyperactive BS  Musculoskeletal: She exhibits no edema.  Neurological:  Patient is not opening eyes, however, does wiggle her toes on command.   Skin: Skin is warm and dry.  No rash noted. She is not diaphoretic. No erythema.   Lab results: Basic Metabolic Panel:  Recent Labs  06/25/15 0018  NA 134*  K 4.2  CL 100*  CO2 20*  GLUCOSE 600*  BUN 45*  CREATININE 3.64*  CALCIUM 9.6   Liver Function Tests:  Recent Labs  06/25/15 0018  AST 58*  ALT 18  ALKPHOS 110  BILITOT 0.8  PROT 8.3*  ALBUMIN 4.0   CBC:  Recent Labs  06/25/15 0018 06/25/15 0509  WBC 16.7* 24.6*  NEUTROABS 14.6*  --   HGB 10.7* 10.9*  HCT 30.8* 31.1*  MCV 90.6 89.4  PLT 392 355   CBG:  Recent Labs  06/25/15 0137 06/25/15 0314 06/25/15 0410 06/25/15 0513 06/25/15 0617 06/25/15 0741  GLUCAP 488* 457* 383* 267* 210* 156*   Coagulation:  Recent Labs  06/25/15 0509  LABPROT 13.3  INR 0.99   Urinalysis:  Recent Labs  06/25/15 0115  COLORURINE YELLOW  LABSPEC 1.016  PHURINE 7.0  GLUCOSEU >1000*  HGBUR SMALL*  BILIRUBINUR NEGATIVE  KETONESUR NEGATIVE  PROTEINUR 100*  NITRITE NEGATIVE  LEUKOCYTESUR NEGATIVE   Imaging results:  Dg Chest 2 View  06/25/2015  CLINICAL DATA:  Acute onset of shaking chills and vomiting. Hyperglycemia. Initial encounter. EXAM: CHEST  2 VIEW COMPARISON:  Chest radiograph performed 11/27/2014 FINDINGS: The lungs are mildly hypoexpanded. Vascular congestion is noted. Bibasilar airspace opacities may reflect mild interstitial edema or possibly pneumonia. No pleural effusion or pneumothorax is seen. The cardiomediastinal silhouette remains normal in size. No acute osseous abnormalities are identified. IMPRESSION: Lungs mildly hypoexpanded. Vascular congestion noted. Bibasilar airspace opacities may reflect mild interstitial edema or possibly pneumonia. Follow-up PA and lateral chest radiograph would be helpful after completion of treatment, to exclude an underlying mass. Electronically Signed   By: Garald Balding M.D.   On: 06/25/2015 02:24    Other results: EKG: Sinus tachycardia, diffuse T wave inversions, QTc prolongation.    Assessment & Plan  by Problem: Active Problems:   Sepsis ([place])   Sepsis likely 2/2 CAP Patient presented with tachycardia and worsening AMS. She was found to be hypoxic at room and O2 sats improved with supplemental O2. Labs showing leukocytosis (WBC 16.7) and lactate 2.61. Procalcitonin 0.75. CXR suggestive of possible PNA. Sepsis not likely from urinary source as UA is not suggestive of an infectious source (nitrites negative, leukocyte esterase negative). No likely PE as no recent long-distance travel, calf pain/ swelling. Wells score +1.5.  -Admit to stepdown -Supplemental O2 to keep O2 sats >92% -Vancomycin per pharmacy -Zosyn per pharmacy -Blood cx pending -Sputum cx pending -Urine cx pending -Labs in AM: CBC  NSTEMI EKG showing tachycardia with diffuse T wave inversions; change from prior. Troponin 9.04. She was given Aspirin in the ED. ED provider spoke to cardiology fellow Dr. Eula Fried show recommended medical management at this time as patient would likely not be a catherization candidate. As such, we will not be trending Troponins any longer.  -Aspirin 81 mg daily -Heparin gtt per pharmacy  -Metoprolol 25 mg BID  Hx of CAD and ischemic cardiomyopathy Her last echo was done with 2013 and her EF was 50-55%, grade 2 DD. Her last cath was in 2004 showing severe three vessel disease, mostly in small vessels and more distal vessels. Also showing high grade proximal stenosis in a large mid diagonal.  -Metoprolol 25 mg BID  ESRD SCr 3.64, BUN 45, GFR 12. GFR was 16 in 11/2014.  -Monitor electrolytes and replete prn  -BMP in am   HTN BP stable at present with systolic in 0000000 and diastolic in 0000000.  -HOLD home meds Amlodipine, Clonidine, and Hydralazine in the setting of sepsis.  -Metoprolol 50 mg daily   Hx of CVA -Plavix 75 mg daily  Diet: heart healthy  Code: DNR/DNI (confirmed with daughter [name] who is HCPOA)  Dispo: Disposition is deferred at this time, awaiting  improvement of current medical problems. Anticipated discharge in approximately 2-3 day(s).   The patient does have a current PCP Milagros Loll, MD) and does need an [facility] hospital follow-up appointment after discharge.  The patient does not have transportation limitations that hinder transportation to clinic appointments.  Signed: Shela Leff, MD 06/25/2015, 8:35 AM

## 2015-06-25 NOTE — ED Notes (Signed)
Pt placed on oxygen @ 4 L/[place] d/t sats @ 88%.  Pt's oxygen increased to 90% on 4 L/[place]

## 2015-06-25 NOTE — Progress Notes (Signed)
ANTIBIOTIC CONSULT NOTE - INITIAL  Pharmacy Consult for Vanco/Zosyn Indication: sepsis, PNA  No Known Allergies  Patient Measurements: Height: 5\' 4"  (162.6 cm) Weight: 123 lb 14.4 oz (56.2 kg) IBW/kg (Calculated) : 54.7 Adjusted Body Weight:   Vital Signs: Temp: 98.6 F (37 C) (04/16 0900) Temp Source: Oral (04/16 0900) BP: 149/90 mmHg (04/16 1100) Pulse Rate: 105 (04/16 1100) Intake/Output from previous day: 04/15 0701 - 04/16 0700 In: 1000 [P.O.:1000] Out: -  Intake/Output from this shift: Total I/O In: 484.9 [I.V.:484.9] Out: -   Labs:  Recent Labs  06/25/15 0018 06/25/15 0509  WBC 16.7* 24.6*  HGB 10.7* 10.9*  PLT 392 355  CREATININE 3.64*  --    Estimated Creatinine Clearance: 10.5 mL/min (by C-G formula based on Cr of 3.64). No results for input(s): VANCOTROUGH, VANCOPEAK, VANCORANDOM, GENTTROUGH, GENTPEAK, GENTRANDOM, TOBRATROUGH, TOBRAPEAK, TOBRARND, AMIKACINPEAK, AMIKACINTROU, AMIKACIN in the last 72 hours.   Microbiology: No results found for this or any previous visit (from the past 720 hour(s)).  Medical History: Past Medical History  Diagnosis Date  . Hyperlipidemia   . Hypertension   . Diabetes mellitus   . Chronic kidney disease     stage IV  . RAS (renal artery stenosis) ([place])   . Stroke [facility])     s/p CVA unknown timing  . Ischemic cardiomyopathy     EF 20%  . Dementia   . CAD (coronary artery disease)     s/p PTCA May 2005, stenting of an intermittent blanch of the CMX and the angioplasty of teh diagonal branch to the left descending  . Cardiomyopathy, ischemic     Medications:  Prescriptions prior to admission  Medication Sig Dispense Refill Last Dose  . amLODipine (NORVASC) 10 MG tablet TAKE 1 TABLET BY MOUTH EVERY DAY 90 tablet 3 06/24/2015 at Unknown time  . BD INSULIN SYRINGE ULTRAFINE 31G X 5/16" 0.3 ML MISC 1 SYRINGE BY DOES NOT APPLY ROUTE 2 (TWO) TIMES DAILY. 60 each 3 06/24/2015 at Unknown time  . ciprofloxacin (CIPRO) 250  MG tablet Take 250 mg by mouth 2 (two) times daily.   06/24/2015 at Unknown time  . cloNIDine (CATAPRES - DOSED IN MG/24 HR) 0.2 mg/24hr patch PLACE 1 PATCH (0.2 MG TOTAL) ONTO THE SKIN ONCE A WEEK. 4 patch 3 06/24/2015  . clopidogrel (PLAVIX) 75 MG tablet Take 1 tablet (75 mg total) by mouth daily. 90 tablet 4 06/24/2015 at Unknown time  . CVS IBUPROFEN 200 MG tablet Take 200 mg by mouth every 4 (four) hours as needed. for pain  2 06/24/2015 at Unknown time  . CVS SENNA PLUS 8.6-50 MG tablet Take 1 tablet by mouth 2 (two) times daily as needed. For constipation  2 06/24/2015 at Unknown time  . furosemide (LASIX) 40 MG tablet TAKE 1 AND 1/2 TABLETS (60 MG TOTAL) BY MOUTH DAILY. 90 tablet 3 06/24/2015 at Unknown time  . glucose blood (ACCU-CHEK AVIVA PLUS) test strip Use to test blood glucose 3 times daily. Dx: E13.9 100 each 12 06/24/2015 at Unknown time  . hydrALAZINE (APRESOLINE) 25 MG tablet TAKE 1 TABLET (25 MG TOTAL) BY MOUTH 2 (TWO) TIMES DAILY. 60 tablet 3 06/24/2015 at Unknown time  . hydrOXYzine (ATARAX/VISTARIL) 10 MG tablet Take 10 mg by mouth every 6 (six) hours as needed. For itching  1 06/24/2015 at Unknown time  . Insulin Isophane & Regular Human (HUMULIN 70/30 KWIKPEN) (70-30) 100 UNIT/ML PEN Inject subcutaneously 15 units in the morning and 6 units in  the evening. 15 mL 2 06/24/2015 at both doses  . Insulin Pen Needle (ULTRA-THIN II MINI PEN NEEDLE) 31G X 5 MM MISC Use to inject insulin twice a day. Dx code: E13.9 100 each 2 06/24/2015 at Unknown time  . metoprolol succinate (TOPROL-XL) 50 MG 24 hr tablet TAKE 1 TABLET BY MOUTH EVERY DAY WITH OR IMMEDIATELY FOLLOWING A MEAL 90 tablet 3 06/24/2015 at 1630  . temazepam (RESTORIL) 15 MG capsule Take 15 mg by mouth at bedtime as needed and may repeat dose one time if needed. For sleep.  1 unknown   Assessment: 82 y/o hospice patient presents with hyperglycemia and  AMS at home. Daughter reports CBGs near 600 despite increasing insulin doses. Abx for  suspected UTI 4/14. Sats 88%. Elevated troponin  PMHx of HTN, HLD, DM2, CKD stage IV, CVA, dementia, ischemic cardiomyopathy, CAD s/p PTCA in 07/2003, [facility], multiple cardiac arrests.  Infectious Disease: r/o UTI/sepsis/PNA. Vanco/Zosyn LA 2.61>4.02, PC 0.75. WBC 16.7>24.6 overnight. F/u cultures. 4/16 Zosyn>> 4/16 Vanco>>  Goal of Therapy:  Vancomycin trough level 15-20 mcg/ml  Plan:  Stat heparin level Vancomycin 1g IV at [facility] ED, then 750mg  IV q48h Zosyn 2.25g IV q8hr    Dreydon Cardenas S. Alford Highland, PharmD, BCPS Clinical Staff Pharmacist Pager [phone]  [place], [place] 06/25/2015,11:12 AM

## 2015-06-25 NOTE — ED Notes (Signed)
MD at bedside. 

## 2015-06-25 NOTE — Progress Notes (Signed)
ANTICOAGULATION CONSULT NOTE - Initial Consult  Pharmacy Consult for Heparin Indication: chest pain/ACS  No Known Allergies  Patient Measurements: Height: 5\' 4"  (162.6 cm) Weight: 129 lb (58.514 kg) IBW/kg (Calculated) : 54.7 Heparin Dosing Weight:   Vital Signs: Temp: 99.3 F (37.4 C) (04/16 0109) Temp Source: Rectal (04/16 0109) BP: 170/103 mmHg (04/16 0342) Pulse Rate: 118 (04/16 0342)  Labs:  Recent Labs  06/25/15 0018  HGB 10.7*  HCT 30.8*  PLT 392  CREATININE 3.64*    Estimated Creatinine Clearance: 10.5 mL/min (by C-G formula based on Cr of 3.64).   Medical History: Past Medical History  Diagnosis Date  . Hyperlipidemia   . Hypertension   . Diabetes mellitus   . Chronic kidney disease     stage IV  . RAS (renal artery stenosis) ([place])   . Stroke [facility])     s/p CVA unknown timing  . Ischemic cardiomyopathy     EF 20%  . Dementia   . CAD (coronary artery disease)     s/p PTCA May 2005, stenting of an intermittent blanch of the CMX and the angioplasty of teh diagonal branch to the left descending  . Cardiomyopathy, ischemic     Medications:  Infusions:  . dextrose 5 % and 0.45% NaCl Stopped (06/25/15 0354)  . heparin    . insulin (NOVOLIN-R) infusion 6.5 Units/hr (06/25/15 0413)    Assessment: Patient in ED with hyperglycemia.  Patient found to be septic, and + troponin.  No oral anticoagulants noted on med rec.  Baseline labs ordered.    Goal of Therapy:  Heparin level 0.3-0.7 units/ml Monitor platelets by anticoagulation protocol: Yes   Plan:  Heparin bolus 2500 units iv x1 Heparin drip at 700  units/hr Daily CBC Next heparin level at [address], [place] 06/25/2015,4:43 AM

## 2015-06-25 NOTE — ED Notes (Signed)
[facility] arrived  

## 2015-06-25 NOTE — ED Notes (Signed)
Per lab, blue top in lab. Labels sent.

## 2015-06-25 NOTE — Consult Note (Signed)
CARDIOLOGY CONSULT NOTE  Patient Name: GAVRIELLE NEVES Date of Encounter: 06/25/2015  Principal Problem:   Sepsis ([place]) Active Problems:   Diabetes 1.5, managed as type 2 ([place])   Hyperlipidemia   Essential hypertension   Coronary atherosclerosis   Congestive heart failure ([place])   KIDNEY DISEASE, CHRONIC, STAGE IV   Severe dementia   NSTEMI (non-ST elevated myocardial infarction) ([place])   CAP (community acquired pneumonia)   Hyperglycemia   Primary Cardiologist: Harrington Challenger Patient Profile:  Ms. Kemmerlin is a 82 year old female with a past medical history of DM, HLD, HTN, CKD stage IV, CVA, advanced dementia, ischemic cardiomyopathy. She presented to the ED via EMS from home for altered mental status and decreased oxygen saturation. She was found to have PNA and started on IV antibiotics. She is a Hospice patient and lives with her daughter.  Her troponin is elevated at 9.04.   Pt lives with her daughter. Her daughter states that her blood sugar was in the 400s on Friday and she was vomiting, had "puffy eyes," "big lips," and diarrhea. The day prior, she thought the patient had common cold symptoms as her voice was changing. States she called Hospice and was instructed either to give more insulin or bring the patient into the hospital. As per daughter, patient is conversive and ambulatory at baseline. She is normally oriented to person only due to history of dementia. Now daughter believes patient is not verbally responsive but is able to follow commands. Denies any fevers and states she is not sure about chills because patient feels cold all the time. States her appetite was good until all these symptoms started. Denies patient complaining of any CP, SOB, cough, wheezing, or dysuria. No recent travel or calf pain/ swelling. Denies any hematemesis, hematochezia, or melena.   Her last echo was done with 2013 and her EF was 50-55%, grade 2 DD.  Her last cath was in 2004 (report below).  She had diffuse  disease in her LAD and PDA.   SUBJECTIVE: She is non verbal.  Does not respond to commands.   OBJECTIVE Filed Vitals:   06/25/15 1100 06/25/15 1200 06/25/15 1300 06/25/15 1400  BP: 149/90 141/80 118/65 130/69  Pulse: 105 106 89 85  Temp:  98.3 F (36.8 C)    TempSrc:  Oral    Resp: 25 23 14 19   Height:      Weight:      SpO2: 94% 92% 96% 98%    Intake/Output Summary (Last 24 hours) at 06/25/15 1506 Last data filed at 06/25/15 1400  Gross per 24 hour  Intake 1797.34 ml  Output      0 ml  Net 1797.34 ml   Filed Weights   06/25/15 0118 06/25/15 0900  Weight: 129 lb (58.514 kg) 123 lb 14.4 oz (56.2 kg)    PHYSICAL EXAM General: Well developed, well nourished, female in no acute distress. Head: Normocephalic, atraumatic.  Neck: Supple without bruits, No JVD. Lungs:  Resp regular and unlabored, CTA. Heart: RRR, S1, S2, no S3, S4, or murmur; no rub. Abdomen: Soft, non-tender, non-distended, BS + x 4.  Extremities: No clubbing, cyanosis, No edema.  Neuro: Non verbal.  Does not follow commands.  Psych: Normal affect.  LABS: CBC: Recent Labs  06/25/15 0018 06/25/15 0509  WBC 16.7* 24.6*  NEUTROABS 14.6*  --   HGB 10.7* 10.9*  HCT 30.8* 31.1*  MCV 90.6 89.4  PLT 392 355   INR: Recent Labs  06/25/15  0509  INR [id number]   Basic Metabolic Panel: Recent Labs  06/25/15 0018  NA 134*  K 4.2  CL 100*  CO2 20*  GLUCOSE 600*  BUN 45*  CREATININE 3.64*  CALCIUM 9.6   Liver Function Tests: Recent Labs  06/25/15 0018  AST 58*  ALT 18  ALKPHOS 110  BILITOT 0.8  PROT 8.3*  ALBUMIN 4.0   Cardiac Enzymes: Recent Labs  06/25/15 1055  TROPONINI 17.02*    Recent Labs  06/25/15 0409  TROPIPOC 9.04*     Current facility-administered medications:  .  antiseptic oral rinse (CPC / CETYLPYRIDINIUM CHLORIDE 0.05%) solution 7 mL, 7 mL, Mouth Rinse, BID, Bartholomew Crews, MD, 7 mL at 06/25/15 1032 .  [START ON 06/26/2015] aspirin EC tablet 81 mg, 81 mg,  Oral, Daily, Carly J Rivet, MD .  clopidogrel (PLAVIX) tablet 75 mg, 75 mg, Oral, Daily, Carly J Rivet, MD .  dextrose 5 %-0.45 % sodium chloride infusion, , Intravenous, Continuous, Merryl Hacker, MD, Last Rate: 100 mL/hr at 06/25/15 1344 .  heparin ADULT infusion 100 units/mL (25000 units/250 mL), 700 Units/hr, Intravenous, Continuous, Crystal Trellis Moment, RPH, Last Rate: 7 mL/hr at 06/25/15 1214, 700 Units/hr at 06/25/15 1214 .  insulin aspart (novoLOG) injection 0-9 Units, 0-9 Units, Subcutaneous, 6 times per day, Juliet Rude, MD, 0 Units at 06/25/15 1200 .  metoprolol tartrate (LOPRESSOR) tablet 25 mg, 25 mg, Oral, BID, Carly J Rivet, MD .  piperacillin-tazobactam (ZOSYN) IVPB 2.25 g, 2.25 g, Intravenous, 3 times per day, Karren Cobble, RPH, 2.25 g at 06/25/15 1314 .  [START ON 06/27/2015] vancomycin (VANCOCIN) IVPB 750 mg/150 ml premix, 750 mg, Intravenous, Q48H, Karren Cobble, RPH . dextrose 5 % and 0.45% NaCl 100 mL/hr at 06/25/15 1344  . heparin 700 Units/hr (06/25/15 1214)   TELE:  NSR      ECG: Stach with diffuse ST depression.   Left heart cath 2004:  CORONARY ARTERIOGRAPHY: 1. The left main coronary artery had luminal irregularities.  1. The left anterior descending had proximal diffuse 25% stenosis followed  by a mid 40% stenosis of the large first diagonal and a long 30% after  the first diagonal. There was atypical moderate diffuse disease. The  first diagonal was large with an ostial 99% stenosis followed by proximal  90% stenosis.  1. The circumflex and the AV groove was a somewhat small vessel. There was  subtotal stenosis in the distal vessel feeding a posterior lateral  branch. There was a very large ramus intermediate with long proximal and  mid 30-40% stenosis. The mid stented area had diffuse in-stent 30%  renarrowing. The right coronary artery was a dominant vessel. It was  diffusely diseased with 30%  stenosis. There was a mid 50% focal lesion.  The PDA was long and diffusely diseased. There was proximal 80% stenosis  followed by a mid 99% stenosis and severe diffuse distal disease. There  was a small posterior lateral which was also subtotally stenosed.  1. Left ventricle: The left ventricle was not injected secondary to renal  insufficiency. An aortogram was obtained secondary to her difficulty to  control hypertension and renal insufficiency. This demonstrated moderate  luminal irregularities in the distal AO proximal to the bifurcation.  There was 99% long stenosis in the left renal artery. The right renal  artery was free of significant disease.  CONCLUSION: Severe three vessel coronary disease with the bulk of the disease being in small vessels, more distal  vessels. However, she does have high grade proximal stenosis in a large mid diagonal. This has previously been treated with a cutting balloon. I will review these films with my interventional colleagues to discuss percutaneous revascularization of this. This would need to be done after she has had adequate hydration and observation of her renal function. I think she should have consideration of percutaneous revascularization of the left renal artery.     Radiology/Studies: Dg Chest 2 View  06/25/2015  CLINICAL DATA:  Acute onset of shaking chills and vomiting. Hyperglycemia. Initial encounter. EXAM: CHEST  2 VIEW COMPARISON:  Chest radiograph performed 11/27/2014 FINDINGS: The lungs are mildly hypoexpanded. Vascular congestion is noted. Bibasilar airspace opacities may reflect mild interstitial edema or possibly pneumonia. No pleural effusion or pneumothorax is seen. The cardiomediastinal silhouette remains normal in size. No acute osseous abnormalities are identified. IMPRESSION: Lungs mildly hypoexpanded. Vascular congestion noted. Bibasilar airspace opacities may reflect mild  interstitial edema or possibly pneumonia. Follow-up PA and lateral chest radiograph would be helpful after completion of treatment, to exclude an underlying mass. Electronically Signed   By: Garald Balding M.D.   On: 06/25/2015 02:24     Current Medications:  . antiseptic oral rinse  7 mL Mouth Rinse BID  . [START ON 06/26/2015] aspirin EC  81 mg Oral Daily  . clopidogrel  75 mg Oral Daily  . insulin aspart  0-9 Units Subcutaneous 6 times per day  . metoprolol tartrate  25 mg Oral BID  . piperacillin-tazobactam (ZOSYN)  IV  2.25 g Intravenous 3 times per day  . [START ON 06/27/2015] vancomycin  750 mg Intravenous Q48H   . dextrose 5 % and 0.45% NaCl 100 mL/hr at 06/25/15 1344  . heparin 700 Units/hr (06/25/15 1214)    ASSESSMENT AND PLAN: Principal Problem:   Sepsis ([place]) Active Problems:   Diabetes 1.5, managed as type 2 ([place])   Hyperlipidemia   Essential hypertension   Coronary atherosclerosis   Congestive heart failure ([place])   KIDNEY DISEASE, CHRONIC, STAGE IV   Severe dementia   NSTEMI (non-ST elevated myocardial infarction) ([place])   CAP (community acquired pneumonia)   Hyperglycemia  1. NSTEMI: Troponin elevated at 9.04.  She is unable to verbalize whether she is having any pain.  She is not a candidate for cardiac cath currently. Will continue heparin gtt for 48 hours. Continue aspirin, statin and beta blocker.    2. Chronic systolic and diastolic CHF: Not volume overloaded on exam.   \  3. Advanced dementia  4. CKD, stage IV  5. DKA   Signed, Arbutus Leas , NP 3:06 PM 06/25/2015 Pager [phone]  Patient seen and examined with Jettie Booze NP-C. We discussed all aspects of the encounter. I agree with the assessment and plan as stated above.   82 y/o female with known small vessel CAD, advanced dementia, CKD IV currently on hospice. Admitted with DKA and found to have associated NSTEMI. Currently non-verbal. ECG with TWI. No STEMI. She is not candidate for  invasive work-up would treat conservatively with heparin x 48 hours, ASA, b-blocker and statin as tolerated. We will sign off. Please call with questions.   Azusena Erlandson,MD 3:48 PM

## 2015-06-25 NOTE — Progress Notes (Signed)
Pt swallow assessed. Drank water with no difficulty. No coughing noted. Pt able to protect airway. Applesauce eaten with no difficulty.

## 2015-06-25 NOTE — ED Notes (Signed)
Per Dr. Dina Rich, awaiting results of urine before ordering abx.

## 2015-06-25 NOTE — ED Notes (Signed)
Awaiting acceptance at [facility] ED for further treatment.

## 2015-06-26 DIAGNOSIS — Z8679 Personal history of other diseases of the circulatory system: Secondary | ICD-10-CM

## 2015-06-26 DIAGNOSIS — I110 Hypertensive heart disease with heart failure: Secondary | ICD-10-CM

## 2015-06-26 DIAGNOSIS — I129 Hypertensive chronic kidney disease with stage 1 through stage 4 chronic kidney disease, or unspecified chronic kidney disease: Secondary | ICD-10-CM

## 2015-06-26 DIAGNOSIS — N179 Acute kidney failure, unspecified: Secondary | ICD-10-CM

## 2015-06-26 DIAGNOSIS — E785 Hyperlipidemia, unspecified: Secondary | ICD-10-CM

## 2015-06-26 DIAGNOSIS — I5042 Chronic combined systolic (congestive) and diastolic (congestive) heart failure: Secondary | ICD-10-CM

## 2015-06-26 DIAGNOSIS — J189 Pneumonia, unspecified organism: Secondary | ICD-10-CM

## 2015-06-26 DIAGNOSIS — Z8673 Personal history of transient ischemic attack (TIA), and cerebral infarction without residual deficits: Secondary | ICD-10-CM

## 2015-06-26 LAB — HEPARIN LEVEL (UNFRACTIONATED)
HEPARIN UNFRACTIONATED: 0.29 [IU]/mL — AB (ref 0.30–0.70)
Heparin Unfractionated: 0.27 IU/mL — ABNORMAL LOW (ref 0.30–0.70)

## 2015-06-26 LAB — BASIC METABOLIC PANEL
Anion gap: 11 (ref 5–15)
BUN: 42 mg/dL — AB (ref 6–20)
CALCIUM: 8.3 mg/dL — AB (ref 8.9–10.3)
CO2: 20 mmol/L — ABNORMAL LOW (ref 22–32)
CREATININE: 4.09 mg/dL — AB (ref 0.44–1.00)
Chloride: 106 mmol/L (ref 101–111)
GFR calc Af Amer: 11 mL/min — ABNORMAL LOW (ref >60)
GFR, EST NON AFRICAN AMERICAN: 9 mL/min — AB (ref >60)
Glucose, Bld: 230 mg/dL — ABNORMAL HIGH (ref 65–99)
Potassium: 3.6 mmol/L (ref 3.5–5.1)
SODIUM: 137 mmol/L (ref 135–145)

## 2015-06-26 LAB — GLUCOSE, CAPILLARY
GLUCOSE-CAPILLARY: 236 mg/dL — AB (ref 65–99)
Glucose-Capillary: 182 mg/dL — ABNORMAL HIGH (ref 65–99)
Glucose-Capillary: 183 mg/dL — ABNORMAL HIGH (ref 65–99)
Glucose-Capillary: 186 mg/dL — ABNORMAL HIGH (ref 65–99)
Glucose-Capillary: 220 mg/dL — ABNORMAL HIGH (ref 65–99)
Glucose-Capillary: 308 mg/dL — ABNORMAL HIGH (ref 65–99)
Glucose-Capillary: 96 mg/dL (ref 65–99)

## 2015-06-26 LAB — CBC
HCT: 24.6 % — ABNORMAL LOW (ref 36.0–46.0)
Hemoglobin: 8.3 g/dL — ABNORMAL LOW (ref 12.0–15.0)
MCH: 30.9 pg (ref 26.0–34.0)
MCHC: 33.7 g/dL (ref 30.0–36.0)
MCV: 91.4 fL (ref 78.0–100.0)
PLATELETS: 257 10*3/uL (ref 150–400)
RBC: 2.69 MIL/uL — ABNORMAL LOW (ref 3.87–5.11)
RDW: 11.9 % (ref 11.5–15.5)
WBC: 18 10*3/uL — ABNORMAL HIGH (ref 4.0–10.5)

## 2015-06-26 LAB — URINE CULTURE: CULTURE: NO GROWTH

## 2015-06-26 LAB — LIPID PANEL
CHOLESTEROL: 181 mg/dL (ref 0–200)
HDL: 41 mg/dL (ref >40)
LDL CALC: 121 mg/dL — AB (ref 0–99)
Total CHOL/HDL Ratio: 4.4 RATIO
Triglycerides: 96 mg/dL (ref <150)
VLDL: 19 mg/dL (ref 0–40)

## 2015-06-26 LAB — PHOSPHORUS: PHOSPHORUS: 3.4 mg/dL (ref 2.5–4.6)

## 2015-06-26 LAB — MAGNESIUM: Magnesium: 1.8 mg/dL (ref 1.7–2.4)

## 2015-06-26 MED ORDER — DEXTROSE 5 % IV SOLN
1.0000 g | INTRAVENOUS | Status: DC
  Administered 2015-06-26 – 2015-06-27 (×2): 1 g via INTRAVENOUS
  Filled 2015-06-26 (×2): qty 10

## 2015-06-26 MED ORDER — DEXTROSE 5 % IV SOLN
500.0000 mg | INTRAVENOUS | Status: DC
  Administered 2015-06-26 – 2015-06-27 (×2): 500 mg via INTRAVENOUS
  Filled 2015-06-26 (×2): qty 500

## 2015-06-26 MED ORDER — AMLODIPINE BESYLATE 5 MG PO TABS
5.0000 mg | ORAL_TABLET | Freq: Every day | ORAL | Status: DC
  Administered 2015-06-26: 5 mg via ORAL
  Filled 2015-06-26: qty 1

## 2015-06-26 NOTE — Progress Notes (Signed)
PATIENT ID: [name] is an 82 year old female with hypertension, hyperlipidemia, diabetes, CKD 4, advanced dementia, prior stroke, CAD,and chronic diastolic heart failure who presents with sepsis and non-ST elevation myocardial infarction.  SUBJECTIVE:  Denies chest pain or shortness of breath.   PHYSICAL EXAM Filed Vitals:   06/26/15 0115 06/26/15 0204 06/26/15 0317 06/26/15 0742  BP: 137/75 127/81 135/7 149/79  Pulse:      Temp:   98.8 F (37.1 C) 98.7 F (37.1 C)  TempSrc:   Oral Oral  Resp: 25 24 33 24  Height:      Weight:      SpO2: 93% 96% 98% 98%   General:  Sleepy. No acute distress. Neck: no JVD Lungs:  Clear to auscultation on anterior exam. Heart:  Regular rate and rhythm. No murmurs, rubs, or gallops. Abdomen:  Soft. Nontender, nondistended. Active bowel sounds. Extremities:  No edema.  LABS: Lab Results  Component Value Date   TROPONINI 17.02* 06/25/2015   Results for orders placed or performed during the hospital encounter of 06/24/15 (from the past 24 hour(s))  Lactic acid, plasma     Status: Abnormal   Collection Time: 06/25/15 10:55 AM  Result Value Ref Range   Lactic Acid, Venous 2.4 (HH) 0.5 - 2.0 mmol/L  Troponin I (q 6hr x 3)     Status: Abnormal   Collection Time: 06/25/15 10:55 AM  Result Value Ref Range   Troponin I 17.02 (HH) <0.031 ng/mL  Heparin level (unfractionated)     Status: None   Collection Time: 06/25/15 10:55 AM  Result Value Ref Range   Heparin Unfractionated 0.39 0.30 - 0.70 IU/mL  Glucose, capillary     Status: Abnormal   Collection Time: 06/25/15 11:51 AM  Result Value Ref Range   Glucose-Capillary 103 (H) 65 - 99 mg/dL  Lactic acid, plasma     Status: None   Collection Time: 06/25/15  1:21 PM  Result Value Ref Range   Lactic Acid, Venous 1.7 0.5 - 2.0 mmol/L  Glucose, capillary     Status: Abnormal   Collection Time: 06/25/15  4:47 PM  Result Value Ref Range   Glucose-Capillary 274 (H) 65 - 99 mg/dL   Comment 1  Capillary Specimen   Heparin level (unfractionated)     Status: None   Collection Time: 06/25/15  6:06 PM  Result Value Ref Range   Heparin Unfractionated 0.42 0.30 - 0.70 IU/mL  Glucose, capillary     Status: Abnormal   Collection Time: 06/25/15  7:43 PM  Result Value Ref Range   Glucose-Capillary 308 (H) 65 - 99 mg/dL   Comment 1 Capillary Specimen   Glucose, capillary     Status: Abnormal   Collection Time: 06/26/15 12:49 AM  Result Value Ref Range   Glucose-Capillary 183 (H) 65 - 99 mg/dL   Comment 1 Capillary Specimen   CBC     Status: Abnormal   Collection Time: 06/26/15  3:01 AM  Result Value Ref Range   WBC 18.0 (H) 4.0 - 10.5 K/uL   RBC 2.69 (L) 3.87 - 5.11 MIL/uL   Hemoglobin 8.3 (L) 12.0 - 15.0 g/dL   HCT 24.6 (L) 36.0 - 46.0 %   MCV 91.4 78.0 - 100.0 fL   MCH 30.9 26.0 - 34.0 pg   MCHC 33.7 30.0 - 36.0 g/dL   RDW 11.9 11.5 - 15.5 %   Platelets 257 150 - 400 K/uL  Basic metabolic panel     Status: Abnormal  Collection Time: 06/26/15  3:01 AM  Result Value Ref Range   Sodium 137 135 - 145 mmol/L   Potassium 3.6 3.5 - 5.1 mmol/L   Chloride 106 101 - 111 mmol/L   CO2 20 (L) 22 - 32 mmol/L   Glucose, Bld 230 (H) 65 - 99 mg/dL   BUN 42 (H) 6 - 20 mg/dL   Creatinine, Ser 4.09 (H) 0.44 - 1.00 mg/dL   Calcium 8.3 (L) 8.9 - 10.3 mg/dL   GFR calc non Af Amer 9 (L) >60 mL/min   GFR calc Af Amer 11 (L) >60 mL/min   Anion gap 11 5 - 15  Phosphorus     Status: None   Collection Time: 06/26/15  3:01 AM  Result Value Ref Range   Phosphorus 3.4 2.5 - 4.6 mg/dL  Magnesium     Status: None   Collection Time: 06/26/15  3:01 AM  Result Value Ref Range   Magnesium 1.8 1.7 - 2.4 mg/dL  Heparin level (unfractionated)     Status: Abnormal   Collection Time: 06/26/15  3:05 AM  Result Value Ref Range   Heparin Unfractionated 0.27 (L) 0.30 - 0.70 IU/mL  Glucose, capillary     Status: Abnormal   Collection Time: 06/26/15  3:24 AM  Result Value Ref Range   Glucose-Capillary 220  (H) 65 - 99 mg/dL   Comment 1 Capillary Specimen   Glucose, capillary     Status: Abnormal   Collection Time: 06/26/15  7:42 AM  Result Value Ref Range   Glucose-Capillary 186 (H) 65 - 99 mg/dL   Comment 1 Capillary Specimen     Intake/Output Summary (Last 24 hours) at 06/26/15 1048 Last data filed at 06/26/15 0600  Gross per 24 hour  Intake 2378.97 ml  Output      0 ml  Net 2378.97 ml    Telemetry: sinus rhythm. Occasional PVCs.  ASSESSMENT AND PLAN:  Principal Problem:   Sepsis ([place]) Active Problems:   Diabetes 1.5, managed as type 2 ([place])   Hyperlipidemia   Essential hypertension   Coronary atherosclerosis   Congestive heart failure ([place])   KIDNEY DISEASE, CHRONIC, STAGE IV   Severe dementia   NSTEMI (non-ST elevated myocardial infarction) ([place])   CAP (community acquired pneumonia)   Hyperglycemia   # NSTEMI: [name] remains chest pain-free. She does have ischemic changes on EKG and troponin was elevated to 17.  She has not candidate for cardiac catheterization due to her advanced dementia and CKD4. Continue medical management with aspirin, Plavix, atorvastatin, and heparin for 48 hours (noon on 4/18).  We will obtain an echo for prognostic information.  # Hypertension:  Blood pressure is now slightly elevated. Her home meds were initially held.  We will start amlodipine 5 mg daily.  She was on 10 mg at home as well as hydralazine, clonidine and metoprolol. Continue metoprolol as ordered.  # Chronic systolic and diastolic heart failure:  Echo as above. Continue metoprolol. She is euvolemic and we will not restart her furosemide at this time.   [name], MD, [facility] 06/26/2015 10:48 AM

## 2015-06-26 NOTE — Progress Notes (Signed)
ANTICOAGULATION CONSULT NOTE - Follow Up Consult  Pharmacy Consult for Heparin Indication: chest pain/ACS  No Known Allergies  Patient Measurements: Height: 5\' 4"  (162.6 cm) Weight: 123 lb 14.4 oz (56.2 kg) IBW/kg (Calculated) : 54.7 Heparin Dosing Weight:  54.7 kg  Vital Signs: Temp: 98.8 F (37.1 C) (04/17 0317) Temp Source: Oral (04/17 0317) BP: 135/7 mmHg (04/17 0317) Pulse Rate: 90 (04/17 0000)  Labs:  Recent Labs  06/25/15 0018 06/25/15 0509 06/25/15 1055 06/25/15 1806 06/26/15 0301 06/26/15 0305  HGB 10.7* 10.9*  --   --  8.3*  --   HCT 30.8* 31.1*  --   --  24.6*  --   PLT 392 355  --   --  257  --   APTT  --  27  --   --   --   --   LABPROT  --  13.3  --   --   --   --   INR  --  0.99  --   --   --   --   HEPARINUNFRC  --   --  0.39 0.42  --  0.27*  CREATININE 3.64*  --   --   --  4.09*  --   TROPONINI  --   --  17.02*  --   --   --     Estimated Creatinine Clearance: 9.3 mL/min (by C-G formula based on Cr of 4.09).  Assessment:  82 y/o hospice patient presents with hyperglycemia and  AMS at home. Daughter reports CBGs near 600 despite increasing insulin doses. Abx for suspected UTI 4/14. Sats 88%. Elevated troponin  PMHx of HTN, HLD, DM2, CKD stage IV, CVA, dementia, ischemic cardiomyopathy, CAD s/p PTCA in 07/2003, sundowner's, multiple cardiac arrests.  Anticoagulation: r/o cardiac ischemia. Heparin was apparently running at 2500 units/hr for several hours at [facility]?? instead of the 2500 unit BOLUS?? (first charted 0510) according to the RN that got report. Heparin was stopped by EMS upon transport of patient to [facility]. Arrived around 0900. Check STAT HL=0.39. Resume heparin at previous rate of 700 units/hr and check in 6 hrs.  HL 0.27 on heparin 700 units/hr, Hgb 8.3 < 10.9, no sxs of bleeding per RN. Per cardiology plan to continue heparin for 48 hours then stop.   Goal of Therapy:  Heparin level 0.3-0.7 units/ml Monitor platelets by anticoagulation  protocol: Yes   Plan:  1. Increase heparin drip slightly to 750 units/hr 2. HL in 8 hours 3. Hgb trending down will follow closely  4. Noted plans to stop heparin in 48 hours per cardiology  Vincenza Hews, PharmD, BCPS 06/26/2015, 4:20 AM Pager: [phone]

## 2015-06-26 NOTE — Progress Notes (Signed)
  Date: 06/26/2015  Patient name: SELMA LANIUS  Medical record number: [id number]  Date of birth: 08-20-1931   I have seen and evaluated Lysbeth Galas and discussed their care with the Residency Team. Ms Morici was transferred to [facility] ER for hyperglycemia and AMS. As she is an [facility] pt, she was transferred to our service. She has a NSTEMI, CAP, and acute on chronic renal failure. Ms Vredeveld is a hospice pt and the family was treatable and reversible illnesses treated per ED notes (family not present). Per notes, at baseline she is talkative but oriented to person only.   This AM, Ms Brei is alert and oriented to person. She denies pain or dyspnea. She complains when we touch or roll her.  PMHx, Fam Hx, and/or Soc Hx : Hospice, family not present to give other details  Filed Vitals:   06/26/15 0317 06/26/15 0742  BP: 135/7 149/79  Pulse:    Temp: 98.8 F (37.1 C) 98.7 F (37.1 C)  Resp: 33 24  HR 90  Gen elderly, NAD HRRR cannot appreciate murmur L crackles on R mid but hard to get exam 2/2 pt poor compliance Ext no edema Neuro alert, oriented to person only  Cr 4.09 Trop 17 WBC 18 HgB 8 MRSA negative  I indep reviewed the CXR images and confirmed my reading with the official CXR reading. Pul edema, bibasilar infiltrates   I indep reviewed the EKG and confirmed my reading with the official EKG reading. Sinus, LAD, inverted T laterally  Assessment and Plan: I have seen and evaluated the patient as outlined above. I agree with the formulated Assessment and Plan as detailed in the residents' note, with the following changes:   1. NSTEMI - this is confirmed by Trop I and EKG and is likely lateral AMI. She is not an intervention candidate and is being tx medically with heparin for 48 hrs, BB, statin, asa, and plavix. Hemodynamically stable.   2. CAP - her T max is 99.8. She does have a leukocytosis but could be 2/2 NSTEMI. Her CXR could be infiltrate vs edema. Her procalcitonin is  0.75. Taken together, likely represents a bacterial infx but will narrow abx since CAP and not HCAP.   3. Acute on chronic renal failure - this is likely 2/2 CAP and NSTEMI. We are treating both. She did get IVF but at present euvolemic if not slightly up and will cont to get some IVF with meds. She is able to take PO and will encourage that. She is not a HD candidate.  4. H/O cardiomyopathy - her most recent ECHO was 2013 and had grade 2 D dysfxn. Her EF was nl. ECHO being repeated.  Keep in Step down another 24 hrs and re-eval in AM.  Bartholomew Crews, MD 4/17/201711:05 AM

## 2015-06-26 NOTE — Progress Notes (Signed)
ANTICOAGULATION CONSULT NOTE - Follow Up Consult  Pharmacy Consult for Heparin Indication: chest pain/ACS  No Known Allergies  Patient Measurements: Height: 5\' 4"  (162.6 cm) Weight: 123 lb 14.4 oz (56.2 kg) IBW/kg (Calculated) : 54.7 Heparin Dosing Weight:  54.7 kg  Vital Signs: Temp: 98.2 F (36.8 C) (04/17 1136) Temp Source: Axillary (04/17 1136) BP: 159/76 mmHg (04/17 1136)  Labs:  Recent Labs  06/25/15 0018 06/25/15 0509  06/25/15 1055 06/25/15 1806 06/26/15 0301 06/26/15 0305 06/26/15 1114  HGB 10.7* 10.9*  --   --   --  8.3*  --   --   HCT 30.8* 31.1*  --   --   --  24.6*  --   --   PLT 392 355  --   --   --  257  --   --   APTT  --  27  --   --   --   --   --   --   LABPROT  --  13.3  --   --   --   --   --   --   INR  --  0.99  --   --   --   --   --   --   HEPARINUNFRC  --   --   < > 0.39 0.42  --  0.27* 0.29*  CREATININE 3.64*  --   --   --   --  4.09*  --   --   TROPONINI  --   --   --  17.02*  --   --   --   --   < > = values in this interval not displayed.  Estimated Creatinine Clearance: 9.3 mL/min (by C-G formula based on Cr of 4.09).  Assessment: 82 yo female with NSTEMI on heparin. Heparin level is 0.29 after increase to 750 units/hr. Plans noted for heparin duration of 48hrs. -Hg= 8.3 (down from 10.9; noted fluid positive2.7L the last 24 hours), plt= 257  Goal of Therapy:  Heparin level 0.3-0.7 units/ml Monitor platelets by anticoagulation protocol: Yes   Plan:  Increase heparin to 800 units/r Recheck heparin level and CBC in am  Hildred Laser, Pharm D 06/26/2015 2:29 PM

## 2015-06-26 NOTE — Care Management Note (Signed)
Case Management Note  Patient Details  Name: [name] MRN: [id number] Date of Birth: [birth year]  Subjective/Objective:           Adm w sepsis         Action/Plan: lives w da, act w hospice and paliative care of [place]   Expected Discharge Date:                  Expected Discharge Plan:  Home w Hospice Care  In-House Referral:     Discharge planning Services  CM Consult  Post Acute Care Choice:    Choice offered to:     DME Arranged:    DME Agency:     HH Arranged:    [place] Agency:     Status of Service:     Medicare Important Message Given:    Date Medicare IM Given:    Medicare IM give by:    Date Additional Medicare IM Given:    Additional Medicare Important Message give by:     If discussed at [place] of Stay Meetings, dates discussed:    Additional Comments:ur review done  Lacretia Leigh, RN 06/26/2015, 11:28 AM

## 2015-06-26 NOTE — Progress Notes (Signed)
Subjective: No acute overnight events. Patient states she is doing okay and her breathing is okay. She is oriented to person only (baseline as per daughter). She did not answer any further questions.   Objective: Vital signs in last 24 hours: Filed Vitals:   06/26/15 0204 06/26/15 0317 06/26/15 0742 06/26/15 1136  BP: 127/81 135/7 149/79 159/76  Pulse:      Temp:  98.8 F (37.1 C) 98.7 F (37.1 C) 98.2 F (36.8 C)  TempSrc:  Oral Oral Axillary  Resp: 24 33 24 27  Height:      Weight:      SpO2: 96% 98% 98% 100%   Weight change: -2.314 kg (-5 lb 1.6 oz)  Intake/Output Summary (Last 24 hours) at 06/26/15 1228 Last data filed at 06/26/15 0600  Gross per 24 hour  Intake 2178.55 ml  Output      0 ml  Net 2178.55 ml   Physical Exam: Constitutional:  Elderly African American female lying comfortably in hospital bed.  Cardiovascular: Normal rate, regular rhythm and intact distal pulses.  Pulmonary/Chest:  Satting 98-100% on 3L O2 via [place] Mild crackles in the RLL area. No wheezes or rhonchi.   Abdominal: Soft. +BS. She exhibits no distension. There is no tenderness. There is no rebound and no guarding.  Musculoskeletal: She exhibits no edema.  Neurological:  Awake, alert, oriented to person only (baseline) Replying to some questions Following commands intermittently   Skin: Skin is warm and dry.   Lab Results: Basic Metabolic Panel:  Recent Labs Lab 06/25/15 0018 06/26/15 0301  NA 134* 137  K 4.2 3.6  CL 100* 106  CO2 20* 20*  GLUCOSE 600* 230*  BUN 45* 42*  CREATININE 3.64* 4.09*  CALCIUM 9.6 8.3*  MG  --  1.8  PHOS  --  3.4   Liver Function Tests:  Recent Labs Lab 06/25/15 0018  AST 58*  ALT 18  ALKPHOS 110  BILITOT 0.8  PROT 8.3*  ALBUMIN 4.0   CBC:  Recent Labs Lab 06/25/15 0018 06/25/15 0509 06/26/15 0301  WBC 16.7* 24.6* 18.0*  NEUTROABS 14.6*  --   --   HGB 10.7* 10.9* 8.3*  HCT 30.8* 31.1* 24.6*  MCV 90.6 89.4 91.4  PLT 392 355  257   Cardiac Enzymes:  Recent Labs Lab 06/25/15 1055  TROPONINI 17.02*   CBG:  Recent Labs Lab 06/25/15 1647 06/25/15 1943 06/26/15 0049 06/26/15 0324 06/26/15 0742 06/26/15 1139  GLUCAP 274* 308* 183* 220* 186* 182*   Coagulation:  Recent Labs Lab 06/25/15 0509  LABPROT 13.3  INR 0.99   Urinalysis:  Recent Labs Lab 06/25/15 0115  COLORURINE YELLOW  LABSPEC 1.016  PHURINE 7.0  GLUCOSEU >1000*  HGBUR SMALL*  BILIRUBINUR NEGATIVE  KETONESUR NEGATIVE  PROTEINUR 100*  NITRITE NEGATIVE  LEUKOCYTESUR NEGATIVE   Micro Results: Recent Results (from the past 240 hour(s))  Urine culture     Status: None   Collection Time: 06/25/15  1:15 AM  Result Value Ref Range Status   Specimen Description URINE, CATHETERIZED  Final   Special Requests NONE  Final   Culture   Final    NO GROWTH 1 DAY Performed at [facility]    Report Status 06/26/2015 FINAL  Final  MRSA PCR Screening     Status: None   Collection Time: 06/25/15  9:20 AM  Result Value Ref Range Status   MRSA by PCR NEGATIVE NEGATIVE Final    Comment:  The GeneXpert MRSA Assay (FDA approved for NASAL specimens only), is one component of a comprehensive MRSA colonization surveillance program. It is not intended to diagnose MRSA infection nor to guide or monitor treatment for MRSA infections.    Studies/Results: Dg Chest 2 View  06/25/2015  CLINICAL DATA:  Acute onset of shaking chills and vomiting. Hyperglycemia. Initial encounter. EXAM: CHEST  2 VIEW COMPARISON:  Chest radiograph performed 11/27/2014 FINDINGS: The lungs are mildly hypoexpanded. Vascular congestion is noted. Bibasilar airspace opacities may reflect mild interstitial edema or possibly pneumonia. No pleural effusion or pneumothorax is seen. The cardiomediastinal silhouette remains normal in size. No acute osseous abnormalities are identified. IMPRESSION: Lungs mildly hypoexpanded. Vascular congestion noted. Bibasilar  airspace opacities may reflect mild interstitial edema or possibly pneumonia. Follow-up PA and lateral chest radiograph would be helpful after completion of treatment, to exclude an underlying mass. Electronically Signed   By: Garald Balding M.D.   On: 06/25/2015 02:24   Medications: I have reviewed the patient's current medications. Scheduled Meds: . amLODipine  5 mg Oral Daily  . antiseptic oral rinse  7 mL Mouth Rinse BID  . aspirin EC  81 mg Oral Daily  . atorvastatin  80 mg Oral q1800  . azithromycin  500 mg Intravenous Q24H  . cefTRIAXone (ROCEPHIN)  IV  1 g Intravenous Q24H  . clopidogrel  75 mg Oral Daily  . insulin aspart  0-9 Units Subcutaneous 6 times per day  . insulin glargine  5 Units Subcutaneous QHS  . metoprolol tartrate  25 mg Oral BID   Continuous Infusions: . heparin 750 Units/hr (06/26/15 0517)   PRN Meds:. Assessment/Plan: Principal Problem:   Sepsis ([place]) Active Problems:   Diabetes 1.5, managed as type 2 ([place])   Hyperlipidemia   Essential hypertension   Coronary atherosclerosis   Congestive heart failure ([place])   KIDNEY DISEASE, CHRONIC, STAGE IV   Severe dementia   NSTEMI (non-ST elevated myocardial infarction) ([place])   CAP (community acquired pneumonia)   Hyperglycemia  CAP: Currently satting 98-100% on 3L O2 via [place]. White count trending down and lactic acid down to normal. Source not likely urinary as urine Cx is negative.  -Supplemental O2 to keep O2 sats >92% -Discontinued Vancomycin and Zosyn -Started Azithromycin and Ceftriaxone  -Blood cx pending -Sputum cx pending -CBC in am   NSTEMI: continue medical management as patient is not a candidate for catherization due to her advanced dementia and CKD stage 4.   -Heparin gtt for 48 hrs (stop at 12 pm 4/18)  -Aspirin 81 mg daily -Metoprolol 25 mg BID -Lipitor 80 mg daily   Hx of CAD and ischemic cardiomyopathy Echo from 2013 noted EF 50-55%, grade 2 DD. Cath done in 2004 showing severe three  vessel disease, mostly in small vessels and more distal vessels. Also showing high grade proximal stenosis in a large mid diagonal.  -Aspirin 81 mg daily -Metoprolol 25 mg BID -Echo pending   HTN BP high today 149/79 - 159/76.  -HOLD home meds Clonidine and Hydralazine -Metoprolol 25 mg BID -Started home medication Amlodipine at a lower dose (5 mg daily)  CKD stage 4/5 SCr worsened from 3.64 on admission to 4.09 today. Cr was 2.9 in 11/2014. GFR was 16 in 11/2014.  -Monitor electrolytes and replete prn  -BMP in am   Hx of CVA -Plavix 75 mg daily  Diet: heart healthy  Dispo: Disposition is deferred at this time, awaiting improvement of current medical problems.  Anticipated discharge in  approximately 1-2 day(s).   The patient does have a current PCP Milagros Loll, MD) and does need an [facility] hospital follow-up appointment after discharge.  The patient does not have transportation limitations that hinder transportation to clinic appointments.  .Services Needed at time of discharge: Y = Yes, Blank = No PT:   OT:   RN:   Equipment:   Other:     LOS: 1 day   Shela Leff, MD 06/26/2015, 12:28 PM

## 2015-06-26 NOTE — Plan of Care (Signed)
Problem: Safety: Goal: Ability to remain free from injury will improve Outcome: Progressing Patient's family sat with patient and ensured safety. [place] applied to prevent patient from pulling out another IV or other tubes.

## 2015-06-26 NOTE — Progress Notes (Signed)
[facility] - 2H-20C  Hospice and Palliative Care RN Visit.  This is a GIP, related and covered admission of 06/25/15 Per Dr. Tomasa Hosteller with [facility] diagnosis of  Chronic Kidney Disease.   Code Status is DNR.   Patient presented to ED on 06/24/15 with hyperglycemia and altered mental status .  Patient dx with sepsis and new NStemi - Troponin of 9.04.     Visited patient at bedside. She is resting and does not arouse during the visit.  The patient's daughter Sydell Axon is present as well as [facility] RN case Proofreader and [place].  Staff RN also came in and reports that the patient was awake earlier and was able to eat a small amount and brush her teeth.    The patient is currently receiving continuous infusion of Heparin 750 Units  - Increased from 700 units on 4/16.  She is also receiving IV Antibiotics of Zithromax 500 mg q24 h and Rocephin 1Gm q24 h .   She is on 02 at 3L per n/c.n  She is currently receiving Lantus Insulin 5mg  qhs and Novolog 0-9u 6 x daily per SS.  She has required coverage 5 x  In the last 24 hours for total of 19 u.    [facility] will continue to follow patient and anticipate any discharge needs.   Please call with questions.  Per [facility] SW, daughters are wanting to have patient return home when medically stable.   Thank you.   Mickie Kay, Shelly Bombard,  [place]  [phone]

## 2015-06-26 NOTE — Progress Notes (Signed)
Home Care SW and RN went to visit Patient.  Her Daughter, Sydell Axon, arrived at same time and stated she has only seen her mother when she was asleep and other family have been present when Patient was awake.  Patient was asleep, and [place] arrived during visit.  SW had talked to Daughter, [name], who stated she wants her mother to come home at time of discharge.  Patient appeared comfortable during visit.  SW will cnotinue to visit and provide ongoing support and assist with discharge planning as needed.  Cecilio Asper, [place] [facility] [place]

## 2015-06-26 NOTE — Progress Notes (Signed)
Inpatient Diabetes Program Recommendations  AACE/ADA: New Consensus Statement on Inpatient Glycemic Control (2015)  Target Ranges:  Prepandial:   less than 140 mg/dL      Peak postprandial:   less than 180 mg/dL (1-2 hours)      Critically ill patients:  140 - 180 mg/dL   Review of Glycemic Control Results for [name], [name] (MRN [id number]) as of 06/26/2015 10:13  Ref. Range 06/25/2015 16:47 06/25/2015 19:43 06/26/2015 00:49 06/26/2015 03:24 06/26/2015 07:42  Glucose-Capillary Latest Ref Range: 65-99 mg/dL 274 (H) 308 (H) 183 (H) 220 (H) 186 (H)   Diabetes history: DM Type 2 (1 1/2 treated as 2) Outpatient Diabetes medications: Insulin 70/30 15 units ac breakfast + 6 units ac dinner Current orders for Inpatient glycemic control: Lantus 5 units @ hs +Novolog correction scale sensitive 0-9 q 4 hrs.  Inpatient Diabetes Program Recommendations:  Please consider increase of Lantus insulin to 8 units (approx. 1/2 of home basal insulin dose).  Thank you, [name]. [name] Riesen, RN, MSN, CDE Inpatient Glycemic Control Team Team Pager [phone] (8am-5pm) 06/26/2015 10:16 AM

## 2015-06-26 NOTE — Progress Notes (Signed)
Report given to Bethena Roys, RN at 0000. Pt transferred from room 2H12 to 2H20 at this time. Johnsie Cancel, RN

## 2015-06-27 ENCOUNTER — Inpatient Hospital Stay (HOSPITAL_COMMUNITY)

## 2015-06-27 DIAGNOSIS — I2510 Atherosclerotic heart disease of native coronary artery without angina pectoris: Secondary | ICD-10-CM

## 2015-06-27 DIAGNOSIS — I255 Ischemic cardiomyopathy: Secondary | ICD-10-CM

## 2015-06-27 DIAGNOSIS — I10 Essential (primary) hypertension: Secondary | ICD-10-CM

## 2015-06-27 LAB — CBC
HCT: 25.8 % — ABNORMAL LOW (ref 36.0–46.0)
Hemoglobin: 8.4 g/dL — ABNORMAL LOW (ref 12.0–15.0)
MCH: 30 pg (ref 26.0–34.0)
MCHC: 32.6 g/dL (ref 30.0–36.0)
MCV: 92.1 fL (ref 78.0–100.0)
PLATELETS: 249 10*3/uL (ref 150–400)
RBC: 2.8 MIL/uL — ABNORMAL LOW (ref 3.87–5.11)
RDW: 11.7 % (ref 11.5–15.5)
WBC: 12.7 10*3/uL — ABNORMAL HIGH (ref 4.0–10.5)

## 2015-06-27 LAB — HEPARIN LEVEL (UNFRACTIONATED)

## 2015-06-27 LAB — BASIC METABOLIC PANEL
Anion gap: 14 (ref 5–15)
BUN: 39 mg/dL — ABNORMAL HIGH (ref 6–20)
CO2: 17 mmol/L — ABNORMAL LOW (ref 22–32)
Calcium: 8.1 mg/dL — ABNORMAL LOW (ref 8.9–10.3)
Chloride: 107 mmol/L (ref 101–111)
Creatinine, Ser: 4.04 mg/dL — ABNORMAL HIGH (ref 0.44–1.00)
GFR calc Af Amer: 11 mL/min — ABNORMAL LOW (ref >60)
GFR, EST NON AFRICAN AMERICAN: 10 mL/min — AB (ref >60)
GLUCOSE: 129 mg/dL — AB (ref 65–99)
Potassium: 3.4 mmol/L — ABNORMAL LOW (ref 3.5–5.1)
Sodium: 138 mmol/L (ref 135–145)

## 2015-06-27 LAB — ECHOCARDIOGRAM COMPLETE
HEIGHTINCHES: 64 in
WEIGHTICAEL: 1982.38 [oz_av]

## 2015-06-27 LAB — GLUCOSE, CAPILLARY
GLUCOSE-CAPILLARY: 222 mg/dL — AB (ref 65–99)
GLUCOSE-CAPILLARY: 259 mg/dL — AB (ref 65–99)
Glucose-Capillary: 122 mg/dL — ABNORMAL HIGH (ref 65–99)
Glucose-Capillary: 86 mg/dL (ref 65–99)
Glucose-Capillary: 265 mg/dL — ABNORMAL HIGH (ref 65–99)

## 2015-06-27 MED ORDER — HYDRALAZINE HCL 25 MG PO TABS: 25.0000 mg | ORAL_TABLET | Freq: Once | ORAL | Status: DC

## 2015-06-27 MED ORDER — POTASSIUM CHLORIDE CRYS ER 20 MEQ PO TBCR
40.0000 meq | EXTENDED_RELEASE_TABLET | Freq: Once | ORAL | Status: AC
  Administered 2015-06-27: 40 meq via ORAL
  Filled 2015-06-27: qty 2

## 2015-06-27 MED ORDER — HYDRALAZINE HCL 25 MG PO TABS
25.0000 mg | ORAL_TABLET | Freq: Two times a day (BID) | ORAL | Status: DC
  Administered 2015-06-27 – 2015-06-28 (×3): 25 mg via ORAL
  Filled 2015-06-27 (×3): qty 1

## 2015-06-27 MED ORDER — METOPROLOL TARTRATE 50 MG PO TABS
50.0000 mg | ORAL_TABLET | Freq: Two times a day (BID) | ORAL | Status: DC
  Administered 2015-06-27: 50 mg via ORAL
  Filled 2015-06-27: qty 1

## 2015-06-27 MED ORDER — ATORVASTATIN CALCIUM 80 MG PO TABS: 80.0000 mg | ORAL_TABLET | Freq: Every day | ORAL | Status: DC

## 2015-06-27 MED ORDER — LEVOFLOXACIN 250 MG PO TABS: ORAL_TABLET | ORAL | Status: DC

## 2015-06-27 MED ORDER — AMLODIPINE BESYLATE 10 MG PO TABS
10.0000 mg | ORAL_TABLET | Freq: Every day | ORAL | Status: DC
  Administered 2015-06-27 – 2015-06-28 (×2): 10 mg via ORAL
  Filled 2015-06-27 (×2): qty 1

## 2015-06-27 MED ORDER — ASPIRIN 81 MG PO TBEC: 81.0000 mg | DELAYED_RELEASE_TABLET | Freq: Every day | ORAL | Status: DC

## 2015-06-27 MED ORDER — LEVOFLOXACIN 500 MG PO TABS: ORAL_TABLET | ORAL | Status: DC

## 2015-06-27 NOTE — Progress Notes (Signed)
PATIENT ID: [name] is an 82 year old female with hypertension, hyperlipidemia, diabetes, CKD 4, advanced dementia, prior stroke, CAD,and chronic diastolic heart failure who presents with sepsis and non-ST elevation myocardial infarction.  SUBJECTIVE:  Denies chest pain or shortness of breath.  Confused    PHYSICAL EXAM Filed Vitals:   06/26/15 2333 06/27/15 0000 06/27/15 0400 06/27/15 0742  BP: 122/111 97/84 153/95 172/84  Pulse:      Temp: 99.6 F (37.6 C)  98.9 F (37.2 C) 98.7 F (37.1 C)  TempSrc: Axillary  Axillary Oral  Resp: 27 27 17 28   Height:      Weight:      SpO2: 93% 98% 91% 93%   General:  Sleepy. No acute distress. Neck: no JVD Lungs:  Clear to auscultation on anterior exam. Heart:  Regular rate and rhythm. No murmurs, rubs, or gallops. Abdomen:  Soft. Nontender, nondistended. Active bowel sounds. Extremities:  No edema.  [place] on hands   LABS: Lab Results  Component Value Date   TROPONINI 17.02* 06/25/2015   Results for orders placed or performed during the hospital encounter of 06/24/15 (from the past 24 hour(s))  Heparin level (unfractionated)     Status: Abnormal   Collection Time: 06/26/15 11:14 AM  Result Value Ref Range   Heparin Unfractionated 0.29 (L) 0.30 - 0.70 IU/mL  Lipid panel     Status: Abnormal   Collection Time: 06/26/15 11:14 AM  Result Value Ref Range   Cholesterol 181 0 - 200 mg/dL   Triglycerides 96 <150 mg/dL   HDL 41 >40 mg/dL   Total CHOL/HDL Ratio 4.4 RATIO   VLDL 19 0 - 40 mg/dL   LDL Cholesterol 121 (H) 0 - 99 mg/dL  Glucose, capillary     Status: Abnormal   Collection Time: 06/26/15 11:39 AM  Result Value Ref Range   Glucose-Capillary 182 (H) 65 - 99 mg/dL   Comment 1 Capillary Specimen   Glucose, capillary     Status: Abnormal   Collection Time: 06/26/15  4:15 PM  Result Value Ref Range   Glucose-Capillary 236 (H) 65 - 99 mg/dL   Comment 1 Capillary Specimen   Glucose, capillary     Status: None   Collection Time: 06/26/15 11:30 PM  Result Value Ref Range   Glucose-Capillary 96 65 - 99 mg/dL   Comment 1 Capillary Specimen   CBC     Status: Abnormal   Collection Time: 06/27/15  2:45 AM  Result Value Ref Range   WBC 12.7 (H) 4.0 - 10.5 K/uL   RBC 2.80 (L) 3.87 - 5.11 MIL/uL   Hemoglobin 8.4 (L) 12.0 - 15.0 g/dL   HCT 25.8 (L) 36.0 - 46.0 %   MCV 92.1 78.0 - 100.0 fL   MCH 30.0 26.0 - 34.0 pg   MCHC 32.6 30.0 - 36.0 g/dL   RDW 11.7 11.5 - 15.5 %   Platelets 249 150 - 400 K/uL  Heparin level (unfractionated)     Status: Abnormal   Collection Time: 06/27/15  2:45 AM  Result Value Ref Range   Heparin Unfractionated <0.10 (L) 0.30 - 0.70 IU/mL  Basic metabolic panel     Status: Abnormal   Collection Time: 06/27/15  2:45 AM  Result Value Ref Range   Sodium 138 135 - 145 mmol/L   Potassium 3.4 (L) 3.5 - 5.1 mmol/L   Chloride 107 101 - 111 mmol/L   CO2 17 (L) 22 - 32 mmol/L   Glucose, Bld 129 (H)  65 - 99 mg/dL   BUN 39 (H) 6 - 20 mg/dL   Creatinine, Ser 4.04 (H) 0.44 - 1.00 mg/dL   Calcium 8.1 (L) 8.9 - 10.3 mg/dL   GFR calc non Af Amer 10 (L) >60 mL/min   GFR calc Af Amer 11 (L) >60 mL/min   Anion gap 14 5 - 15  Glucose, capillary     Status: Abnormal   Collection Time: 06/27/15  3:40 AM  Result Value Ref Range   Glucose-Capillary 122 (H) 65 - 99 mg/dL   Comment 1 Capillary Specimen   Glucose, capillary     Status: None   Collection Time: 06/27/15  7:44 AM  Result Value Ref Range   Glucose-Capillary 86 65 - 99 mg/dL   Comment 1 Capillary Specimen     Intake/Output Summary (Last 24 hours) at 06/27/15 0826 Last data filed at 06/27/15 0600  Gross per 24 hour  Intake 420.53 ml  Output      0 ml  Net 420.53 ml    Telemetry: sinus rhythm. Occasional PVCs.  ASSESSMENT AND PLAN:  Principal Problem:   Sepsis ([place]) Active Problems:   Diabetes 1.5, managed as type 2 ([place])   Hyperlipidemia   Essential hypertension   Coronary atherosclerosis   Congestive heart  failure ([place])   KIDNEY DISEASE, CHRONIC, STAGE IV   Severe dementia   NSTEMI (non-ST elevated myocardial infarction) ([place])   CAP (community acquired pneumonia)   Hyperglycemia   # NSTEMI: [name] remains chest pain-free. She does have ischemic changes on EKG and troponin was elevated to 17.  She is not candidate for cardiac catheterization due to her advanced dementia and CKD4. Continue medical management with aspirin, Plavix, atorvastatin, and heparin for 48 hours (noon on 4/18).  Echo is pending.  # Hypertension:  Blood pressure is increasingly elevated. Yesterday amlodipine 5 mg was added. We will increase this to 10 mg. Her home meds were initially held.  We will start amlodipine 5 mg daily.  We will also restart her home hydralazine 25 mg twice a day. She was on clonidine at home as well. We will not start this at this time. Continue metoprolol as ordered.  # Chronic systolic and diastolic heart failure:  Echo as above. Continue metoprolol. She is euvolemic and we will not restart her furosemide at this time.   Molly Savarino C. Oval Linsey, MD, [facility] 06/27/2015 8:26 AM

## 2015-06-27 NOTE — Progress Notes (Signed)
Patient with O2 saturations in the low 90's while awake. When patient sleeps rates are in the [place]. Pt placed back on 2L [place] during her nap.  Sats currently in the low 90's while asleep. MD notified. Will continue to monitor. [name] Natallia Stellmach,RN

## 2015-06-27 NOTE — Progress Notes (Signed)
Echocardiogram 2D Echocardiogram has been performed.  06/27/2015 3:44 PM Maudry Mayhew, RVT, RDCS, RDMS

## 2015-06-27 NOTE — Progress Notes (Addendum)
  Date: 06/27/2015  Patient name: [name]  Medical record number: [id number]  Date of birth: 1931-05-23   This patient has been seen and the plan of care was discussed with the house staff. Please see their note for complete details. I concur with their findings with the following additions/corrections: Ms Papson was even more alert and talkative this AM. The nurse states she can fed herself when she remembers and doesn't like to be fed. Htachy. No murmur. L clear anteriorly. Mental status at baseline. Ready for D/C home with hospice to complete ABX and CAD med mgmt. ECHO done but results pending.  Bartholomew Crews, MD 06/27/2015, 12:39 PM

## 2015-06-27 NOTE — Progress Notes (Addendum)
ANTICOAGULATION CONSULT NOTE   Pharmacy Consult for Heparin Indication: chest pain/ACS  No Known Allergies  Patient Measurements: Height: 5\' 4"  (162.6 cm) Weight: 123 lb 14.4 oz (56.2 kg) IBW/kg (Calculated) : 54.7 Heparin Dosing Weight:  54.7 kg  Vital Signs: Temp: 99.6 F (37.6 C) (04/17 2333) Temp Source: Axillary (04/17 2333) BP: 97/84 mmHg (04/18 0000) Pulse Rate: 106 (04/17 2140)  Labs:  Recent Labs  06/25/15 0018 06/25/15 0509 06/25/15 1055  06/26/15 0301 06/26/15 0305 06/26/15 1114 06/27/15 0245  HGB 10.7* 10.9*  --   --  8.3*  --   --  8.4*  HCT 30.8* 31.1*  --   --  24.6*  --   --  25.8*  PLT 392 355  --   --  257  --   --  249  APTT  --  27  --   --   --   --   --   --   LABPROT  --  13.3  --   --   --   --   --   --   INR  --  0.99  --   --   --   --   --   --   HEPARINUNFRC  --   --  0.39  < >  --  0.27* 0.29* <0.10*  CREATININE 3.64*  --   --   --  4.09*  --   --  4.04*  TROPONINI  --   --  17.02*  --   --   --   --   --   < > = values in this interval not displayed.  Estimated Creatinine Clearance: 9.4 mL/min (by C-G formula based on Cr of 4.04).  Assessment:  82 yo female with NSTEMI on heparin. Heparin level is < 0.10 after most recent dose increase. Per d/w RN heparin became inadvertently disconnected. CBC stable.  Goal of Therapy:  Heparin level 0.3-0.7 units/ml Monitor platelets by anticoagulation protocol: Yes   Plan:  1. Restart heparin at 800 units/hr with planned stop time of 4/18 at 12 pm 2. No further heparin labs or levels based on stop date   Vincenza Hews, PharmD, BCPS 06/27/2015, 3:44 AM Pager: [phone]

## 2015-06-27 NOTE — Progress Notes (Signed)
Subjective: No acute overnight events. Patient states she is doing okay and her breathing is okay. She has no other complaints. She is oriented to person only (baseline as per daughter).  Objective: Vital signs in last 24 hours: Filed Vitals:   06/26/15 2333 06/27/15 0000 06/27/15 0400 06/27/15 0742  BP: 122/111 97/84 153/95 172/84  Pulse:      Temp: 99.6 F (37.6 C)  98.9 F (37.2 C) 98.7 F (37.1 C)  TempSrc: Axillary  Axillary Oral  Resp: 27 27 17 28   Height:      Weight:      SpO2: 93% 98% 91% 93%   Weight change:   Intake/Output Summary (Last 24 hours) at 06/27/15 1200 Last data filed at 06/27/15 0850  Gross per 24 hour  Intake 920.53 ml  Output      0 ml  Net 920.53 ml   Physical Exam: Constitutional:  Elderly African American female lying comfortably in hospital bed.  Cardiovascular: Normal rate, regular rhythm and intact distal pulses.  Pulmonary/Chest:  Satting 92% on RA Mild crackles in the RLL area. No wheezes or rhonchi.   Abdominal: Soft. +BS. She exhibits no distension. There is no tenderness. There is no rebound and no guarding.  Musculoskeletal: She exhibits no edema.  Neurological:  Pleasantly demented  Awake, alert, oriented to person only (baseline) Following commands Skin: Skin is warm and dry.   Lab Results: Basic Metabolic Panel:  Recent Labs Lab 06/26/15 0301 06/27/15 0245  NA 137 138  K 3.6 3.4*  CL 106 107  CO2 20* 17*  GLUCOSE 230* 129*  BUN 42* 39*  CREATININE 4.09* 4.04*  CALCIUM 8.3* 8.1*  MG 1.8  --   PHOS 3.4  --    Liver Function Tests:  Recent Labs Lab 06/25/15 0018  AST 58*  ALT 18  ALKPHOS 110  BILITOT 0.8  PROT 8.3*  ALBUMIN 4.0   CBC:  Recent Labs Lab 06/25/15 0018  06/26/15 0301 06/27/15 0245  WBC 16.7*  < > 18.0* 12.7*  NEUTROABS 14.6*  --   --   --   HGB 10.7*  < > 8.3* 8.4*  HCT 30.8*  < > 24.6* 25.8*  MCV 90.6  < > 91.4 92.1  PLT 392  < > 257 249  < > = values in this interval not  displayed. Cardiac Enzymes:  Recent Labs Lab 06/25/15 1055  TROPONINI 17.02*   CBG:  Recent Labs Lab 06/26/15 0742 06/26/15 1139 06/26/15 1615 06/26/15 2330 06/27/15 0340 06/27/15 0744  GLUCAP 186* 182* 236* 96 122* 86   Coagulation:  Recent Labs Lab 06/25/15 0509  LABPROT 13.3  INR 0.99   Urinalysis:  Recent Labs Lab 06/25/15 0115  COLORURINE YELLOW  LABSPEC 1.016  PHURINE 7.0  GLUCOSEU >1000*  HGBUR SMALL*  BILIRUBINUR NEGATIVE  KETONESUR NEGATIVE  PROTEINUR 100*  NITRITE NEGATIVE  LEUKOCYTESUR NEGATIVE   Micro Results: Recent Results (from the past 240 hour(s))  Blood Culture (routine x 2)     Status: None (Preliminary result)   Collection Time: 06/25/15 12:18 AM  Result Value Ref Range Status   Specimen Description BLOOD LEFT ANTECUBITAL  Final   Special Requests BOTTLES DRAWN AEROBIC AND ANAEROBIC 5CC  Final   Culture   Final    NO GROWTH 1 DAY Performed at [facility]    Report Status PENDING  Incomplete  Blood Culture (routine x 2)     Status: None (Preliminary result)   Collection Time:  06/25/15 12:52 AM  Result Value Ref Range Status   Specimen Description BLOOD RIGHT ANTECUBITAL  Final   Special Requests BOTTLES DRAWN AEROBIC AND ANAEROBIC 5CC  Final   Culture   Final    NO GROWTH 1 DAY Performed at [facility]    Report Status PENDING  Incomplete  Urine culture     Status: None   Collection Time: 06/25/15  1:15 AM  Result Value Ref Range Status   Specimen Description URINE, CATHETERIZED  Final   Special Requests NONE  Final   Culture   Final    NO GROWTH 1 DAY Performed at [facility]    Report Status 06/26/2015 FINAL  Final  MRSA PCR Screening     Status: None   Collection Time: 06/25/15  9:20 AM  Result Value Ref Range Status   MRSA by PCR NEGATIVE NEGATIVE Final    Comment:        The GeneXpert MRSA Assay (FDA approved for NASAL specimens only), is one component of a comprehensive MRSA  colonization surveillance program. It is not intended to diagnose MRSA infection nor to guide or monitor treatment for MRSA infections.    Studies/Results: No results found. Medications: I have reviewed the patient's current medications. Scheduled Meds: . amLODipine  10 mg Oral Daily  . antiseptic oral rinse  7 mL Mouth Rinse BID  . aspirin EC  81 mg Oral Daily  . atorvastatin  80 mg Oral q1800  . clopidogrel  75 mg Oral Daily  . hydrALAZINE  25 mg Oral BID  . insulin aspart  0-9 Units Subcutaneous 6 times per day  . insulin glargine  5 Units Subcutaneous QHS  . metoprolol tartrate  25 mg Oral BID   Continuous Infusions:   PRN Meds:. Assessment/Plan: Principal Problem:   Sepsis ([place]) Active Problems:   Diabetes 1.5, managed as type 2 ([place])   Hyperlipidemia   Essential hypertension   Coronary atherosclerosis   Congestive heart failure ([place])   KIDNEY DISEASE, CHRONIC, STAGE IV   Severe dementia   NSTEMI (non-ST elevated myocardial infarction) ([place])   CAP (community acquired pneumonia)   Hyperglycemia  CAP: Satting 92% on RA this morning. White count trending down. Blood cx showing NGTD.  -Supplemental O2 to keep O2 sats >92% -Discontinued Vancomycin and Zosyn -Discontinue Azithromycin and Ceftriaxone  -Final blood cx pending -Sputum cx still pending   NSTEMI: continue medical management as patient is not a candidate for catherization due to her advanced dementia and CKD stage 4.   -Heparin gtt for 48 hrs (stop at 12 pm 4/18)  -Aspirin 81 mg daily -Metoprolol 25 mg BID -Lipitor 80 mg daily   Hx of CAD and ischemic cardiomyopathy Echo from 2013 noted EF 50-55%, grade 2 DD. Cath done in 2004 showing severe three vessel disease, mostly in small vessels and more distal vessels. Also showing high grade proximal stenosis in a large mid diagonal.  -Aspirin 81 mg daily -Metoprolol 25 mg BID -Echo pending   HTN BP 146/71 this am. -HOLD home meds Clonidine  -Start  home med Hydralazine25 mg BID -Metoprolol 25 mg BID -Amlodipine 10 mg daily   CKD stage 4/5 SCr 4.0 today.  -Monitor electrolytes and replete prn  -BMP in am   Hx of CVA -Plavix 75 mg daily  Diet: heart healthy  Dispo: Disposition is deferred at this time, awaiting improvement of current medical problems.  Anticipated discharge in approximately 0-1 day(s).   The patient does  have a current PCP Milagros Loll, MD) and does need an [facility] hospital follow-up appointment after discharge.  The patient does not have transportation limitations that hinder transportation to clinic appointments.  .Services Needed at time of discharge: Y = Yes, Blank = No PT:   OT:   RN:   Equipment:   Other:     LOS: 2 days   Shela Leff, MD 06/27/2015, 12:00 PM

## 2015-06-27 NOTE — Progress Notes (Addendum)
[facility] - [facility] RN Visit. This is a GIP, related and covered admission of 06/25/15 Per Dr. Tomasa Hosteller with [facility] diagnosis of Chronic Kidney Disease.  Code Status is DNR Visited patient. She is awake and alert and able to interact.   No family members present.  She denies pain.  She is on 02 per n/c. , Respirations even and regular.  She has her breakfast tray and has been eating a small amount.  IVF infusing at 133ml/hr.   IV heparin to be discontinued today.   CBG's checked q4h . She is currently receiving IV Zithromax 500 mg q24h and IV Rocephin.  Echocardiogram done, results pending.  Spoke with Dr. Marlowe Sax.  Patient may discharge home today pending results of echocardiogram.  Also will attempt to wean off 02 and see how she does.   Will d/c on po antibiotics.   [facility] will continue to follow patient and anticipate any discharge needs.   Please call with questions.   Thank you!  Mickie Kay, RN [place]  [phone]    12:00 Cecille Po with daughter Marden Noble.  She is requesting that oxygen be ordered for the home when patient is discharged.   Order placed with [facility].  RNCN also made aware.

## 2015-06-27 NOTE — Progress Notes (Signed)
Spoke w sue thomas at [facility] of [place]. They have ordered home o2 and spoke w da avis and dora. o2 has been delivered to home. [name] prefers home by ambulance. Medical necesity for filled out and left on shadow chart for nse to call ambulance when dc paperwork complete. [name] nse [phone]. nse will call da avis [phone] when paperwork complete and she is ready to call ambulance.

## 2015-06-28 LAB — CBC
HEMATOCRIT: 29.5 % — AB (ref 36.0–46.0)
Hemoglobin: 9.8 g/dL — ABNORMAL LOW (ref 12.0–15.0)
MCH: 30.7 pg (ref 26.0–34.0)
MCHC: 33.2 g/dL (ref 30.0–36.0)
MCV: 92.5 fL (ref 78.0–100.0)
Platelets: 314 10*3/uL (ref 150–400)
RBC: 3.19 MIL/uL — AB (ref 3.87–5.11)
RDW: 11.7 % (ref 11.5–15.5)
WBC: 15 10*3/uL — AB (ref 4.0–10.5)

## 2015-06-28 LAB — GLUCOSE, CAPILLARY
GLUCOSE-CAPILLARY: 226 mg/dL — AB (ref 65–99)
Glucose-Capillary: 177 mg/dL — ABNORMAL HIGH (ref 65–99)
Glucose-Capillary: 205 mg/dL — ABNORMAL HIGH (ref 65–99)
Glucose-Capillary: 242 mg/dL — ABNORMAL HIGH (ref 65–99)

## 2015-06-28 MED ORDER — ACCU-CHEK AVIVA PLUS W/DEVICE KIT: PACK | Status: AC

## 2015-06-28 MED ORDER — HYDRALAZINE HCL 20 MG/ML IJ SOLN: 10.0000 mg | Freq: Four times a day (QID) | INTRAMUSCULAR | Status: DC | PRN

## 2015-06-28 MED ORDER — METOPROLOL TARTRATE 1 MG/ML IV SOLN
5.0000 mg | Freq: Once | INTRAVENOUS | Status: AC
  Administered 2015-06-28: 5 mg via INTRAVENOUS
  Filled 2015-06-28: qty 5

## 2015-06-28 MED ORDER — CARVEDILOL 25 MG PO TABS
25.0000 mg | ORAL_TABLET | Freq: Two times a day (BID) | ORAL | Status: DC
  Administered 2015-06-28: 25 mg via ORAL
  Filled 2015-06-28: qty 1

## 2015-06-28 MED ORDER — ALBUTEROL SULFATE (2.5 MG/3ML) 0.083% IN NEBU: 2.5000 mg | INHALATION_SOLUTION | Freq: Four times a day (QID) | RESPIRATORY_TRACT | Status: DC | PRN

## 2015-06-28 MED ORDER — ISOSORBIDE MONONITRATE ER 30 MG PO TB24
30.0000 mg | ORAL_TABLET | Freq: Every day | ORAL | Status: DC
  Administered 2015-06-28: 30 mg via ORAL
  Filled 2015-06-28: qty 1

## 2015-06-28 MED ORDER — ALBUTEROL SULFATE HFA 108 (90 BASE) MCG/ACT IN AERS: 2.0000 | INHALATION_SPRAY | Freq: Four times a day (QID) | RESPIRATORY_TRACT | Status: AC | PRN

## 2015-06-28 MED ORDER — CLONIDINE HCL 0.2 MG/24HR TD PTWK: 0.2000 mg | MEDICATED_PATCH | TRANSDERMAL | Status: DC

## 2015-06-28 MED ORDER — CARVEDILOL 25 MG PO TABS: 25.0000 mg | ORAL_TABLET | Freq: Two times a day (BID) | ORAL | Status: DC

## 2015-06-28 MED ORDER — LEVOFLOXACIN 500 MG PO TABS
500.0000 mg | ORAL_TABLET | Freq: Once | ORAL | Status: AC
  Administered 2015-06-28: 500 mg via ORAL
  Filled 2015-06-28: qty 1

## 2015-06-28 MED ORDER — ISOSORBIDE MONONITRATE ER 30 MG PO TB24: 30.0000 mg | ORAL_TABLET | Freq: Every day | ORAL | Status: DC

## 2015-06-28 NOTE — Progress Notes (Signed)
  Date: 06/28/2015  Patient name: [name]  Medical record number: [id number]  Date of birth: 1931-08-13   This patient has been seen and the plan of care was discussed with the house staff. Please see their note for complete details. I concur with their findings with the following additions/corrections: Daughter was om room and I was able to speak to her. She has good understanding of hospital events. I explained new kidney fxn numbers and low EF. Daughter asked about diet and I explained that mostly focused on comfort but daughter plans to institute a heart healthy diet. Daughter states pt at baseline and ready to go home with hospice.   Bartholomew Crews, MD 06/28/2015, 1:26 PM

## 2015-06-28 NOTE — Progress Notes (Signed)
Subjective: No acute overnight events. Patient states she is doing okay and her breathing is okay. She has no other complaints. She is oriented to person only (baseline as per daughter).  Objective: Vital signs in last 24 hours: Filed Vitals:   06/28/15 0600 06/28/15 0615 06/28/15 0630 06/28/15 0820  BP: 186/111 185/108 187/104 202/114  Pulse:      Temp:    99.6 F (37.6 C)  TempSrc:    Axillary  Resp: 24 26 27 29   Height:      Weight:      SpO2:    94%   Weight change:  No intake or output data in the 24 hours ending 06/28/15 1106 Physical Exam: Constitutional:  Elderly African American female sitting up comfortably in hospital bed eating breakfast.  Cardiovascular: Normal rate, regular rhythm and intact distal pulses.  Pulmonary/Chest:  Mild crackles in the RLL area. Very mild end expiratory wheezing. Abdominal: Soft. +BS. She exhibits no distension. There is no tenderness. There is no rebound and no guarding.  Musculoskeletal: She exhibits no edema.  Neurological:  Pleasantly demented  Awake, alert, oriented to person only (baseline) Following commands Skin: Skin is warm and dry.   Lab Results: Basic Metabolic Panel:  Recent Labs Lab 06/26/15 0301 06/27/15 0245  NA 137 138  K 3.6 3.4*  CL 106 107  CO2 20* 17*  GLUCOSE 230* 129*  BUN 42* 39*  CREATININE 4.09* 4.04*  CALCIUM 8.3* 8.1*  MG 1.8  --   PHOS 3.4  --    Liver Function Tests:  Recent Labs Lab 06/25/15 0018  AST 58*  ALT 18  ALKPHOS 110  BILITOT 0.8  PROT 8.3*  ALBUMIN 4.0   CBC:  Recent Labs Lab 06/25/15 0018  06/27/15 0245 06/28/15 0300  WBC 16.7*  < > 12.7* 15.0*  NEUTROABS 14.6*  --   --   --   HGB 10.7*  < > 8.4* 9.8*  HCT 30.8*  < > 25.8* 29.5*  MCV 90.6  < > 92.1 92.5  PLT 392  < > 249 314  < > = values in this interval not displayed. Cardiac Enzymes:  Recent Labs Lab 06/25/15 1055  TROPONINI 17.02*   CBG:  Recent Labs Lab 06/27/15 1203 06/27/15 1614  06/27/15 2032 06/28/15 0018 06/28/15 0436 06/28/15 0826  GLUCAP 222* 265* 259* 177* 226* 205*   Coagulation:  Recent Labs Lab 06/25/15 0509  LABPROT 13.3  INR 0.99   Urinalysis:  Recent Labs Lab 06/25/15 0115  COLORURINE YELLOW  LABSPEC 1.016  PHURINE 7.0  GLUCOSEU >1000*  HGBUR SMALL*  BILIRUBINUR NEGATIVE  KETONESUR NEGATIVE  PROTEINUR 100*  NITRITE NEGATIVE  LEUKOCYTESUR NEGATIVE   Micro Results: Recent Results (from the past 240 hour(s))  Blood Culture (routine x 2)     Status: None (Preliminary result)   Collection Time: 06/25/15 12:18 AM  Result Value Ref Range Status   Specimen Description BLOOD LEFT ANTECUBITAL  Final   Special Requests BOTTLES DRAWN AEROBIC AND ANAEROBIC 5CC  Final   Culture   Final    NO GROWTH 2 DAYS Performed at [facility]    Report Status PENDING  Incomplete  Blood Culture (routine x 2)     Status: None (Preliminary result)   Collection Time: 06/25/15 12:52 AM  Result Value Ref Range Status   Specimen Description BLOOD RIGHT ANTECUBITAL  Final   Special Requests BOTTLES DRAWN AEROBIC AND ANAEROBIC 5CC  Final   Culture  Final    NO GROWTH 2 DAYS Performed at [facility]    Report Status PENDING  Incomplete  Urine culture     Status: None   Collection Time: 06/25/15  1:15 AM  Result Value Ref Range Status   Specimen Description URINE, CATHETERIZED  Final   Special Requests NONE  Final   Culture   Final    NO GROWTH 1 DAY Performed at [facility]    Report Status 06/26/2015 FINAL  Final  MRSA PCR Screening     Status: None   Collection Time: 06/25/15  9:20 AM  Result Value Ref Range Status   MRSA by PCR NEGATIVE NEGATIVE Final    Comment:        The GeneXpert MRSA Assay (FDA approved for NASAL specimens only), is one component of a comprehensive MRSA colonization surveillance program. It is not intended to diagnose MRSA infection nor to guide or monitor treatment for MRSA infections.     Studies/Results: No results found. Medications: I have reviewed the patient's current medications. Scheduled Meds: . amLODipine  10 mg Oral Daily  . antiseptic oral rinse  7 mL Mouth Rinse BID  . aspirin EC  81 mg Oral Daily  . atorvastatin  80 mg Oral q1800  . carvedilol  25 mg Oral BID WC  . [START ON 07/01/2015] cloNIDine  0.2 mg Transdermal Weekly  . clopidogrel  75 mg Oral Daily  . hydrALAZINE  25 mg Oral BID  . insulin aspart  0-9 Units Subcutaneous 6 times per day  . insulin glargine  5 Units Subcutaneous QHS  . isosorbide mononitrate  30 mg Oral Daily  . levofloxacin  500 mg Oral Once   Continuous Infusions:   PRN Meds:. Assessment/Plan: Principal Problem:   Sepsis ([place]) Active Problems:   Diabetes 1.5, managed as type 2 ([place])   Hyperlipidemia   Essential hypertension   Coronary atherosclerosis   Congestive heart failure ([place])   KIDNEY DISEASE, CHRONIC, STAGE IV   Severe dementia   NSTEMI (non-ST elevated myocardial infarction) ([place])   CAP (community acquired pneumonia)   Hyperglycemia  CAP: Satting well on 2L O2 via [place]. Afebrile. Blood cx showing NGTD.  -Supplemental O2 to keep O2 sats >92% -Levaquin 500 mg today. Then starting 06/30/15, Levaquin 250 mg q48 hrs for another 3 doses. -Albuterol nebs prn mild wheezing   -Final blood cx pending   NSTEMI: continue medical management as patient is not a candidate for catherization due to her advanced dementia and CKD stage 4. She was on Heparin gtt for 48 hrs (stop at 12 pm 4/18).  -Aspirin 81 mg daily -Metoprolol 25 mg BID -Lipitor 80 mg daily   Hx of CAD and ischemic cardiomyopathy Cath done in 2004 showing severe three vessel disease, mostly in small vessels and more distal vessels. Also showing high grade proximal stenosis in a large mid diagonal. Echo done 06/27/15 showing LVEF [id number], grade 2 diastolic dysfunction, moderately calcified aortic valve leaflets, moderate mitral regurgitation, mild LA dilation,  moderate tricuspid regurgitation, PA peak pressure elevated at 34 mmHg.  -Aspirin 81 mg daily -Metoprolol 25 mg BID  HTN BP stable now with systolic in Q000111Q and diastolic Q000111Q.  -HOLD home meds Clonidine  -Discontinue Metoprolol  -Started Carvedilol 25 mg BID -Continue Hydralazine25 mg BID -Continue Amlodipine 10 mg daily -She is not on an ACE-I or ARB 2/2 CKD IV   CKD stage 4/5 SCr 4.0; was 3.6 in 11/2014.   -Monitor electrolytes  and replete prn -Encourage PO intake    Hx of CVA -Plavix 75 mg daily  Diet: heart healthy  Dispo: Disposition is deferred at this time, awaiting improvement of current medical problems.  Anticipated discharge in approximately 0-1 day(s).   The patient does have a current PCP Milagros Loll, MD) and does need an [facility] hospital follow-up appointment after discharge.  The patient does not have transportation limitations that hinder transportation to clinic appointments.  .Services Needed at time of discharge: Y = Yes, Blank = No PT:   OT:   RN:   Equipment:   Other:     LOS: 3 days   Shela Leff, MD 06/28/2015, 11:06 AM

## 2015-06-28 NOTE — Progress Notes (Signed)
PATIENT ID: [name] is an 82 year old female with hypertension, hyperlipidemia, diabetes, CKD 4, advanced dementia, prior stroke, CAD,and chronic diastolic heart failure who presents with sepsis and non-ST elevation myocardial infarction.  SUBJECTIVE:  Denies chest pain or shortness of breath. Refuses to take medications by mouth.   PHYSICAL EXAM Filed Vitals:   06/28/15 0545 06/28/15 0600 06/28/15 0615 06/28/15 0630  BP: 186/116 186/111 185/108 187/104  Pulse:      Temp:      TempSrc:      Resp: 25 24 26 27   Height:      Weight:      SpO2:       General:   No acute distress.  Alert and oriented to self only. Reports that she is in her living room. Neck: no JVD Lungs:  Clear to auscultation on anterior exam. Heart:  Regular rate and rhythm. No murmurs, rubs, or gallops. Abdomen:  Soft. Nontender, nondistended. Active bowel sounds. Extremities:  No edema.    LABS: Lab Results  Component Value Date   TROPONINI 17.02* 06/25/2015   Results for orders placed or performed during the hospital encounter of 06/24/15 (from the past 24 hour(s))  Glucose, capillary     Status: Abnormal   Collection Time: 06/27/15 12:03 PM  Result Value Ref Range   Glucose-Capillary 222 (H) 65 - 99 mg/dL   Comment 1 Capillary Specimen   Glucose, capillary     Status: Abnormal   Collection Time: 06/27/15  4:14 PM  Result Value Ref Range   Glucose-Capillary 265 (H) 65 - 99 mg/dL   Comment 1 Capillary Specimen   Glucose, capillary     Status: Abnormal   Collection Time: 06/27/15  8:32 PM  Result Value Ref Range   Glucose-Capillary 259 (H) 65 - 99 mg/dL   Comment 1 Capillary Specimen   Glucose, capillary     Status: Abnormal   Collection Time: 06/28/15 12:18 AM  Result Value Ref Range   Glucose-Capillary 177 (H) 65 - 99 mg/dL   Comment 1 Capillary Specimen   CBC     Status: Abnormal   Collection Time: 06/28/15  3:00 AM  Result Value Ref Range   WBC 15.0 (H) 4.0 - 10.5 K/uL   RBC 3.19 (L)  3.87 - 5.11 MIL/uL   Hemoglobin 9.8 (L) 12.0 - 15.0 g/dL   HCT 29.5 (L) 36.0 - 46.0 %   MCV 92.5 78.0 - 100.0 fL   MCH 30.7 26.0 - 34.0 pg   MCHC 33.2 30.0 - 36.0 g/dL   RDW 11.7 11.5 - 15.5 %   Platelets 314 150 - 400 K/uL  Glucose, capillary     Status: Abnormal   Collection Time: 06/28/15  4:36 AM  Result Value Ref Range   Glucose-Capillary 226 (H) 65 - 99 mg/dL   Comment 1 Capillary Specimen     Intake/Output Summary (Last 24 hours) at 06/28/15 0823 Last data filed at 06/27/15 0850  Gross per 24 hour  Intake    600 ml  Output      0 ml  Net    600 ml    Telemetry: sinus rhythm, sinus tachycardia. Occasional PVCs.  Echo: Study Conclusions  - Left ventricle: The cavity size was normal. Systolic function was  moderately to severely reduced. The estimated ejection fraction  was in the range of 30% to 35%. There is akinesis of the  mid-apicalanteroseptal, anterolateral, and apical myocardium.  Features are consistent with a pseudonormal left ventricular  filling pattern, with concomitant abnormal relaxation and  increased filling pressure (grade 2 diastolic dysfunction). - Aortic valve: Trileaflet; moderately thickened, moderately  calcified leaflets. - Mitral valve: Calcified annulus. Mildly thickened, mildly  calcified leaflets . There was moderate regurgitation. - Left atrium: The atrium was mildly dilated. - Tricuspid valve: There was moderate regurgitation. - Pulmonary arteries: Systolic pressure was mildly increased. PA  peak pressure: 34 mm Hg (S).   ASSESSMENT AND PLAN:  Principal Problem:   Sepsis ([place]) Active Problems:   Diabetes 1.5, managed as type 2 ([place])   Hyperlipidemia   Essential hypertension   Coronary atherosclerosis   Congestive heart failure ([place])   KIDNEY DISEASE, CHRONIC, STAGE IV   Severe dementia   NSTEMI (non-ST elevated myocardial infarction) ([place])   CAP (community acquired pneumonia)   Hyperglycemia   # NSTEMI: [name] remains chest pain-free. She does have ischemic changes on EKG and troponin was elevated to 17.   Echo shows LVEF 30-35% and new wall motion abnormalities compared with 04/02/11.  She is not candidate for cardiac catheterization due to her advanced dementia and CKD4.  Continue medical management with aspirin, Plavix, and atorvastatin.  Heparin was discontinued yesterday.  # Hypertension:  Blood pressure is increasingly elevated despite adding back her home blood pressure medication.  Stop metoprolol and add carvedilol 25mg  bid.  Continue amlodipine and hydralazine.  She is not on an ACE-I or ARB 2/2 CKD IV.  We may have to add back clonidine if her BP is not controlled with carvedilol.  He is not taking medications by mouth at this time.We will order hydralazine 10 mg IV if her family and the nurse or unable to get her to take her oral medications.  # Chronic systolic and diastolic heart failure:  Worsened systolic function compared with 2013.  We will start carvedilol as above.  Continue hydralazine and start Imdur 30 mg daily.   [name] C. Oval Linsey, MD, [facility] 06/28/2015 8:23 AM

## 2015-06-28 NOTE — Progress Notes (Signed)
Home Care SW made visit with Patient, 2 Daughter's were in the room.  Patient was more alert today than she had been and her Daughter was feeding her lunch.  They know she is being discharged today and SW and RN will follow up with them at home tomorrow.  Support given during visit.  Cecilio Asper, [place]

## 2015-06-28 NOTE — Discharge Summary (Signed)
Name: [name] MRN: [id number] DOB: 11/13/1931 82 y.o. PCP: Milagros Loll, MD  Date of Admission: 06/24/2015 10:34 PM Date of Discharge: 06/29/2015 Attending Physician: No att. providers found  Discharge Diagnosis:  Principal Problem:   Sepsis ([place]) Active Problems:   Diabetes 1.5, managed as type 2 ([place])   Hyperlipidemia   Essential hypertension   Coronary atherosclerosis   Congestive heart failure ([place])   KIDNEY DISEASE, CHRONIC, STAGE IV   Severe dementia   NSTEMI (non-ST elevated myocardial infarction) ([place])   CAP (community acquired pneumonia)   Hyperglycemia  Discharge Medications:   Medication List    STOP taking these medications        ciprofloxacin 250 MG tablet  Commonly known as:  CIPRO     cloNIDine 0.2 mg/24hr patch  Commonly known as:  CATAPRES - Dosed in mg/24 hr     furosemide 40 MG tablet  Commonly known as:  LASIX     metoprolol succinate 50 MG 24 hr tablet  Commonly known as:  TOPROL-XL      TAKE these medications        ACCU-CHEK AVIVA PLUS w/Device Kit  Use to check blood glucose as instructed.     albuterol 108 (90 Base) MCG/ACT inhaler  Commonly known as:  PROVENTIL HFA;VENTOLIN HFA  Inhale 2 puffs into the lungs every 6 (six) hours as needed for wheezing or shortness of breath.     amLODipine 10 MG tablet  Commonly known as:  NORVASC  TAKE 1 TABLET BY MOUTH EVERY DAY     aspirin 81 MG EC tablet  Take 1 tablet (81 mg total) by mouth daily.     atorvastatin 80 MG tablet  Commonly known as:  LIPITOR  Take 1 tablet (80 mg total) by mouth daily at 6 PM.     BD INSULIN SYRINGE ULTRAFINE 31G X 5/16" 0.3 ML Misc  Generic drug:  Insulin Syringe-Needle U-100  1 SYRINGE BY DOES NOT APPLY ROUTE 2 (TWO) TIMES DAILY.     carvedilol 25 MG tablet  Commonly known as:  COREG  Take 1 tablet (25 mg total) by mouth 2 (two) times daily with a meal.     clopidogrel 75 MG tablet  Commonly known as:  PLAVIX  Take 1 tablet (75 mg  total) by mouth daily.     [facility] IBUPROFEN 200 MG tablet  Generic drug:  ibuprofen  Take 200 mg by mouth every 4 (four) hours as needed. for pain     [facility]S SENNA PLUS 8.6-50 MG tablet  Generic drug:  senna-docusate  Take 1 tablet by mouth 2 (two) times daily as needed. For constipation     glucose blood test strip  Commonly known as:  ACCU-CHEK AVIVA PLUS  Use to test blood glucose 3 times daily. Dx: E13.9     hydrALAZINE 25 MG tablet  Commonly known as:  APRESOLINE  TAKE 1 TABLET (25 MG TOTAL) BY MOUTH 2 (TWO) TIMES DAILY.     hydrOXYzine 10 MG tablet  Commonly known as:  ATARAX/VISTARIL  Take 10 mg by mouth every 6 (six) hours as needed. For itching     Insulin Isophane & Regular Human (70-30) 100 UNIT/ML PEN  Commonly known as:  HUMULIN 70/30 KWIKPEN  Inject subcutaneously 15 units in the morning and 6 units in the evening.     Insulin Pen Needle 31G X 5 MM Misc  Commonly known as:  ULTRA-THIN II MINI PEN NEEDLE  Use to  inject insulin twice a day. Dx code: E13.9     isosorbide mononitrate 30 MG 24 hr tablet  Commonly known as:  IMDUR  Take 1 tablet (30 mg total) by mouth daily.     levofloxacin 250 MG tablet  Commonly known as:  LEVAQUIN  Take 1 tablet by mouth every 48 hours. First dose on 06/30/15.     temazepam 15 MG capsule  Commonly known as:  RESTORIL  Take 15 mg by mouth at bedtime as needed and may repeat dose one time if needed. For sleep.        Disposition and follow-up:   [name] was discharged from [facility] in Good condition.  At the hospital follow up visit please address:  Labs / imaging needed at time of follow-up: BMP (serum creatinine)  Pending labs/ test needing follow-up:   Follow-up Appointments:     Follow-up Information    Follow up with [name] Earthly, MD. Go on 07/04/2015.   Specialty:  Internal Medicine   Why:  Follow up appointment at 10:15 am.    Contact information:   [name]  [address] [phone]      Consultations:    Cardiology   Procedures Performed:  Dg Chest 2 View  06/25/2015  CLINICAL DATA:  Acute onset of shaking chills and vomiting. Hyperglycemia. Initial encounter. EXAM: CHEST  2 VIEW COMPARISON:  Chest radiograph performed 11/27/2014 FINDINGS: The lungs are mildly hypoexpanded. Vascular congestion is noted. Bibasilar airspace opacities may reflect mild interstitial edema or possibly pneumonia. No pleural effusion or pneumothorax is seen. The cardiomediastinal silhouette remains normal in size. No acute osseous abnormalities are identified. IMPRESSION: Lungs mildly hypoexpanded. Vascular congestion noted. Bibasilar airspace opacities may reflect mild interstitial edema or possibly pneumonia. Follow-up PA and lateral chest radiograph would be helpful after completion of treatment, to exclude an underlying mass. Electronically Signed   By: Garald Balding M.D.   On: 06/25/2015 02:24   Dg Chest Portable 1 View  06/29/2015  CLINICAL DATA:  Short of breath, altered mental status EXAM: PORTABLE CHEST 1 VIEW COMPARISON:  06/25/15 FINDINGS: Cardiac enlargement. Increased interstitial prominence bilaterally. Increased bilateral perihilar opacities right worse than left. Increased bilateral lower lobe opacities right greater than left suggesting pleural effusion. IMPRESSION: Findings consistent with congestive heart failure and moderate to severe pulmonary edema Electronically Signed   By: Skipper Cliche M.D.   On: 06/29/2015 12:08    2D Echo (06/27/15): Study Conclusions  - Left ventricle: The cavity size was normal. Systolic function was  moderately to severely reduced. The estimated ejection fraction  was in the range of 30% to 35%. There is akinesis of the  mid-apicalanteroseptal, anterolateral, and apical myocardium.  Features are consistent with a pseudonormal left ventricular  filling pattern, with concomitant abnormal relaxation and  increased filling  pressure (grade 2 diastolic dysfunction). - Aortic valve: Trileaflet; moderately thickened, moderately  calcified leaflets. - Mitral valve: Calcified annulus. Mildly thickened, mildly  calcified leaflets . There was moderate regurgitation. - Left atrium: The atrium was mildly dilated. - Tricuspid valve: There was moderate regurgitation. - Pulmonary arteries: Systolic pressure was mildly increased. PA  peak pressure: 34 mm Hg (S).  Admission HPI: Patient is a 82 yo F with a PMHx of HTN, HLD, DM2, CKD stage IV, CVA, dementia, ischemic cardiomyopathy, CAD s/p PTCA in 07/2003 being transferred from [facility] to [facility]. As per documentation from [facility], patient was brought into the hospital  by [facility] for hyperglycemia and increased AMS at home. She is a hospice patient, has dementia, renal failure, and has been in cardiac arrest and resuscitated multiple times in the past. She was found to be satting 88% on RA and was placed on 4L O2 via [place]. O2 sats improved to 90%. Vitals notable for tachycardia to 128 and rectal temp 99.3. Labs notable for CBG 541, white count 16.7 and lactate 2.61. She was given 2L of fluid. UA was reassuring. CXR showd concern for PNA. She as started on Vanc and Zosyn. EKG showed tachycardia with diffuse T wave inversions; change from prior. Troponin 9.04. She was given Aspirin. ED provider spoke to cardiology fellow Dr. Eula Fried who recommended medical management at this time as patient would likely not be a catherization candidate.   When we went to see the patient, daughter was in the room. States patient lives with her and hospice nurse visits. She noticed patient's sugar was in the 400s on Friday (2 days ago) and she was vomiting, had "puffy eyes," "big lips," and diarrhea. The day prior, she thought the patient had common cold symptoms as her voice was changing. States she called Hospice and was instructed either to give more insulin or bring the patient into the hospital.  As per daughter, patient is conversive and ambulatory at baseline. She is normally oriented to person only due to history of dementia. Now daughter believes patient is not verbally responsive but is able to follow commands. Denies any fevers and states she is not sure about chills because patient feels cold all the time. States her appetite was good until all these symptoms started. Denies patient complaining of any CP, SOB, cough, wheezing, or dysuria. No recent travel or calf pain/ swelling. Denies any hematemesis, hematochezia, or melena.   Patient's hospice nurse Clifton James later called me and provided me with some additional information. She was concerned that patient was not receiving her BP medications at home including clonidine. States family was checking patient's blood sugars with a glucometer that had an error message on it. States family reported patient having foul/ pungent smelling urine, as such, patient's PCP prescribed antibiotics yesterday to treat possible UTI.   Hospital Course by problem list: Principal Problem:   Sepsis ([place]) Active Problems:   Diabetes 1.5, managed as type 2 ([place])   Hyperlipidemia   Essential hypertension   Coronary atherosclerosis   Congestive heart failure ([place])   KIDNEY DISEASE, CHRONIC, STAGE IV   Severe dementia   NSTEMI (non-ST elevated myocardial infarction) ([place])   CAP (community acquired pneumonia)   Hyperglycemia   Sepsis likely 2/2 CAP Patient presented with tachycardia and altered mental status. She was found to be hypoxic at room and O2 sats improved with supplemental O2. Labs showed leukocytosis (WBC 16.7) and lactate 2.61. Procalcitonin 0.75. CXR was suggestive of pneumonia. She was treated with IV antibiotics. Her leukocytosis and lactic acidosis resolved. Patient was eventually deescalated to oral Levaquin. She was discharged with home oxygen. Patient has been scheduled to follow-up at our internal medicine clinic on 07/04/15.   NSTEMI EKG  on admission showed tachycardia with diffuse T wave inversions; change from prior. Troponin 9.04. Cardiology recommended medical management as patient was not a candidate for catherization due to her advanced dementia and CKD stage 4. She was treated with unfractionated Heparin for 48 hours and started on a high intensity statin. Also continued on Aspirin and beta blocker.   Hx of CAD and ischemic cardiomyopathy Cath done in  2004 showing severe three vessel disease, mostly in small vessels and more distal vessels. Also showing high grade proximal stenosis in a large mid diagonal. Echo done 06/27/15 showing LVEF 37-95%, grade 2 diastolic dysfunction, moderately calcified aortic valve leaflets, moderate mitral regurgitation, mild LA dilation, moderate tricuspid regurgitation, PA peak pressure elevated at 34 mmHg. Patient was continued on Aspirin and beta blocker.   CKD stage 4 Serum Cr 4.0 on discharge but is expected to trend down with improved oral intake.    HTN BP was high during her hospital stay. We held her home medication Clonidine. Metoprolol was stopped and patient was started on Carvedilol as per cardiology recommendation. She was continued on Hydralazine and Amlodipine.   Discharge Vitals:   BP 137/67 mmHg  Pulse 106  Temp(Src) 97.8 F (36.6 C) (Oral)  Resp 29  Ht 5' 4" (1.626 m)  Wt 56.2 kg (123 lb 14.4 oz)  BMI 21.26 kg/m2  SpO2 94%  Discharge Labs:  No results found for this or any previous visit (from the past 24 hour(s)).  Signed: Shela Leff, MD 06/29/2015, 2:06 PM    Services Ordered on Discharge:  Equipment Ordered on Discharge: home oxygen

## 2015-06-29 ENCOUNTER — Emergency Department (HOSPITAL_COMMUNITY)

## 2015-06-29 ENCOUNTER — Inpatient Hospital Stay (HOSPITAL_COMMUNITY)
Admission: EM | Admit: 2015-06-29 | Discharge: 2015-07-02 | DRG: 280 | Disposition: A | Discharge status: Home / Self Care | Attending: Internal Medicine | Admitting: Internal Medicine

## 2015-06-29 ENCOUNTER — Encounter (HOSPITAL_COMMUNITY): Payer: Self-pay | Admitting: Emergency Medicine

## 2015-06-29 DIAGNOSIS — I255 Ischemic cardiomyopathy: Secondary | ICD-10-CM | POA: Diagnosis present

## 2015-06-29 DIAGNOSIS — Z7982 Long term (current) use of aspirin: Secondary | ICD-10-CM

## 2015-06-29 DIAGNOSIS — Z79899 Other long term (current) drug therapy: Secondary | ICD-10-CM | POA: Diagnosis not present

## 2015-06-29 DIAGNOSIS — E1122 Type 2 diabetes mellitus with diabetic chronic kidney disease: Secondary | ICD-10-CM | POA: Diagnosis present

## 2015-06-29 DIAGNOSIS — N19 Unspecified kidney failure: Secondary | ICD-10-CM

## 2015-06-29 DIAGNOSIS — N184 Chronic kidney disease, stage 4 (severe): Secondary | ICD-10-CM | POA: Diagnosis present

## 2015-06-29 DIAGNOSIS — Z9981 Dependence on supplemental oxygen: Secondary | ICD-10-CM

## 2015-06-29 DIAGNOSIS — I214 Non-ST elevation (NSTEMI) myocardial infarction: Secondary | ICD-10-CM | POA: Diagnosis present

## 2015-06-29 DIAGNOSIS — Z8673 Personal history of transient ischemic attack (TIA), and cerebral infarction without residual deficits: Secondary | ICD-10-CM | POA: Diagnosis not present

## 2015-06-29 DIAGNOSIS — I5033 Acute on chronic diastolic (congestive) heart failure: Secondary | ICD-10-CM | POA: Diagnosis not present

## 2015-06-29 DIAGNOSIS — R0602 Shortness of breath: Secondary | ICD-10-CM | POA: Diagnosis present

## 2015-06-29 DIAGNOSIS — I5021 Acute systolic (congestive) heart failure: Secondary | ICD-10-CM

## 2015-06-29 DIAGNOSIS — I2510 Atherosclerotic heart disease of native coronary artery without angina pectoris: Secondary | ICD-10-CM | POA: Diagnosis present

## 2015-06-29 DIAGNOSIS — Z7902 Long term (current) use of antithrombotics/antiplatelets: Secondary | ICD-10-CM

## 2015-06-29 DIAGNOSIS — Z66 Do not resuscitate: Secondary | ICD-10-CM | POA: Diagnosis present

## 2015-06-29 DIAGNOSIS — R0902 Hypoxemia: Secondary | ICD-10-CM | POA: Diagnosis present

## 2015-06-29 DIAGNOSIS — F0390 Unspecified dementia without behavioral disturbance: Secondary | ICD-10-CM | POA: Diagnosis present

## 2015-06-29 DIAGNOSIS — E11649 Type 2 diabetes mellitus with hypoglycemia without coma: Secondary | ICD-10-CM | POA: Diagnosis present

## 2015-06-29 DIAGNOSIS — J189 Pneumonia, unspecified organism: Secondary | ICD-10-CM | POA: Diagnosis present

## 2015-06-29 DIAGNOSIS — I5023 Acute on chronic systolic (congestive) heart failure: Secondary | ICD-10-CM | POA: Diagnosis present

## 2015-06-29 DIAGNOSIS — Z794 Long term (current) use of insulin: Secondary | ICD-10-CM | POA: Diagnosis not present

## 2015-06-29 DIAGNOSIS — I120 Hypertensive chronic kidney disease with stage 5 chronic kidney disease or end stage renal disease: Secondary | ICD-10-CM

## 2015-06-29 DIAGNOSIS — E785 Hyperlipidemia, unspecified: Secondary | ICD-10-CM | POA: Diagnosis present

## 2015-06-29 DIAGNOSIS — I081 Rheumatic disorders of both mitral and tricuspid valves: Secondary | ICD-10-CM | POA: Diagnosis present

## 2015-06-29 DIAGNOSIS — I509 Heart failure, unspecified: Secondary | ICD-10-CM

## 2015-06-29 DIAGNOSIS — I10 Essential (primary) hypertension: Secondary | ICD-10-CM | POA: Diagnosis present

## 2015-06-29 DIAGNOSIS — Z955 Presence of coronary angioplasty implant and graft: Secondary | ICD-10-CM | POA: Diagnosis not present

## 2015-06-29 DIAGNOSIS — E1165 Type 2 diabetes mellitus with hyperglycemia: Secondary | ICD-10-CM | POA: Diagnosis present

## 2015-06-29 DIAGNOSIS — I130 Hypertensive heart and chronic kidney disease with heart failure and stage 1 through stage 4 chronic kidney disease, or unspecified chronic kidney disease: Secondary | ICD-10-CM | POA: Diagnosis present

## 2015-06-29 DIAGNOSIS — E139 Other specified diabetes mellitus without complications: Secondary | ICD-10-CM | POA: Diagnosis present

## 2015-06-29 DIAGNOSIS — F03C0 Unspecified dementia, severe, without behavioral disturbance, psychotic disturbance, mood disturbance, and anxiety: Secondary | ICD-10-CM | POA: Diagnosis present

## 2015-06-29 LAB — GLUCOSE, CAPILLARY
GLUCOSE-CAPILLARY: 275 mg/dL — AB (ref 65–99)
Glucose-Capillary: 195 mg/dL — ABNORMAL HIGH (ref 65–99)
Glucose-Capillary: 372 mg/dL — ABNORMAL HIGH (ref 65–99)

## 2015-06-29 LAB — CBC
HCT: 31.9 % — ABNORMAL LOW (ref 36.0–46.0)
HEMOGLOBIN: 10.4 g/dL — AB (ref 12.0–15.0)
MCH: 29.6 pg (ref 26.0–34.0)
MCHC: 32.6 g/dL (ref 30.0–36.0)
MCV: 90.9 fL (ref 78.0–100.0)
Platelets: 262 10*3/uL (ref 150–400)
RBC: 3.51 MIL/uL — AB (ref 3.87–5.11)
RDW: 11.8 % (ref 11.5–15.5)
WBC: 12.1 10*3/uL — AB (ref 4.0–10.5)

## 2015-06-29 LAB — I-STAT CHEM 8, ED
BUN: 42 mg/dL — AB (ref 6–20)
CALCIUM ION: 1.05 mmol/L — AB (ref 1.13–1.30)
CHLORIDE: 107 mmol/L (ref 101–111)
Creatinine, Ser: 4 mg/dL — ABNORMAL HIGH (ref 0.44–1.00)
GLUCOSE: 385 mg/dL — AB (ref 65–99)
HCT: 31 % — ABNORMAL LOW (ref 36.0–46.0)
Hemoglobin: 10.5 g/dL — ABNORMAL LOW (ref 12.0–15.0)
POTASSIUM: 4 mmol/L (ref 3.5–5.1)
Sodium: 136 mmol/L (ref 135–145)
TCO2: 16 mmol/L (ref 0–100)

## 2015-06-29 LAB — BRAIN NATRIURETIC PEPTIDE: B Natriuretic Peptide: 3053.1 pg/mL — ABNORMAL HIGH (ref 0.0–100.0)

## 2015-06-29 MED ORDER — NITROGLYCERIN 2 % TD OINT
1.0000 [in_us] | TOPICAL_OINTMENT | Freq: Once | TRANSDERMAL | Status: AC
  Administered 2015-06-29: 1 [in_us] via TOPICAL
  Filled 2015-06-29: qty 1

## 2015-06-29 MED ORDER — FUROSEMIDE 10 MG/ML IJ SOLN
40.0000 mg | Freq: Once | INTRAMUSCULAR | Status: AC
  Administered 2015-06-29: 40 mg via INTRAVENOUS
  Filled 2015-06-29: qty 4

## 2015-06-29 MED ORDER — HEPARIN SODIUM (PORCINE) 5000 UNIT/ML IJ SOLN
5000.0000 [IU] | Freq: Three times a day (TID) | INTRAMUSCULAR | Status: DC
  Administered 2015-06-29 – 2015-07-01 (×7): 5000 [IU] via SUBCUTANEOUS
  Filled 2015-06-29 (×8): qty 1

## 2015-06-29 MED ORDER — ALBUTEROL SULFATE (2.5 MG/3ML) 0.083% IN NEBU
5.0000 mg | INHALATION_SOLUTION | Freq: Once | RESPIRATORY_TRACT | Status: AC
  Administered 2015-06-29: 5 mg via RESPIRATORY_TRACT
  Filled 2015-06-29: qty 6

## 2015-06-29 MED ORDER — ALBUTEROL SULFATE (2.5 MG/3ML) 0.083% IN NEBU
5.0000 mg | INHALATION_SOLUTION | Freq: Once | RESPIRATORY_TRACT | Status: AC
Start: 2015-06-29 — End: 2015-06-29
  Administered 2015-06-29: 5 mg via RESPIRATORY_TRACT
  Filled 2015-06-29: qty 6

## 2015-06-29 MED ORDER — AMLODIPINE BESYLATE 10 MG PO TABS
10.0000 mg | ORAL_TABLET | Freq: Every day | ORAL | Status: DC
  Administered 2015-06-30 – 2015-07-02 (×3): 10 mg via ORAL
  Filled 2015-06-29 (×3): qty 1

## 2015-06-29 MED ORDER — ASPIRIN EC 81 MG PO TBEC
81.0000 mg | DELAYED_RELEASE_TABLET | Freq: Every day | ORAL | Status: DC
  Administered 2015-06-30 – 2015-07-02 (×3): 81 mg via ORAL
  Filled 2015-06-29 (×3): qty 1

## 2015-06-29 MED ORDER — LEVOFLOXACIN 500 MG PO TABS
250.0000 mg | ORAL_TABLET | ORAL | Status: AC
  Administered 2015-06-30: 250 mg via ORAL
  Administered 2015-07-02: 10:00:00 via ORAL
  Filled 2015-06-29 (×2): qty 1

## 2015-06-29 MED ORDER — CLOPIDOGREL BISULFATE 75 MG PO TABS
75.0000 mg | ORAL_TABLET | Freq: Every day | ORAL | Status: DC
  Administered 2015-06-30 – 2015-07-02 (×3): 75 mg via ORAL
  Filled 2015-06-29 (×3): qty 1

## 2015-06-29 MED ORDER — ATORVASTATIN CALCIUM 80 MG PO TABS
80.0000 mg | ORAL_TABLET | Freq: Every day | ORAL | Status: DC
  Administered 2015-06-30 – 2015-07-01 (×2): 80 mg via ORAL
  Filled 2015-06-29 (×2): qty 1

## 2015-06-29 MED ORDER — INSULIN GLARGINE 100 UNIT/ML ~~LOC~~ SOLN
5.0000 [IU] | Freq: Every day | SUBCUTANEOUS | Status: DC
  Administered 2015-06-29: 5 [IU] via SUBCUTANEOUS
  Filled 2015-06-29 (×2): qty 0.05

## 2015-06-29 MED ORDER — HYDRALAZINE HCL 25 MG PO TABS
25.0000 mg | ORAL_TABLET | Freq: Two times a day (BID) | ORAL | Status: DC
Start: 2015-06-29 — End: 2015-07-02
  Administered 2015-06-29 – 2015-07-02 (×7): 25 mg via ORAL
  Filled 2015-06-29 (×7): qty 1

## 2015-06-29 MED ORDER — SENNOSIDES-DOCUSATE SODIUM 8.6-50 MG PO TABS: 1.0000 | ORAL_TABLET | Freq: Two times a day (BID) | ORAL | Status: DC | PRN

## 2015-06-29 MED ORDER — ENSURE ENLIVE PO LIQD
237.0000 mL | Freq: Two times a day (BID) | ORAL | Status: DC
Start: 2015-06-30 — End: 2015-06-30
  Administered 2015-06-30: 237 mL via ORAL

## 2015-06-29 MED ORDER — SODIUM CHLORIDE 0.9% FLUSH
3.0000 mL | Freq: Two times a day (BID) | INTRAVENOUS | Status: DC
Start: 2015-06-29 — End: 2015-07-02
  Administered 2015-06-29 – 2015-07-01 (×5): 3 mL via INTRAVENOUS

## 2015-06-29 MED ORDER — INSULIN ASPART 100 UNIT/ML ~~LOC~~ SOLN
0.0000 [IU] | SUBCUTANEOUS | Status: DC
  Administered 2015-06-29: 5 [IU] via SUBCUTANEOUS
  Administered 2015-06-29: 7 [IU] via SUBCUTANEOUS
  Administered 2015-06-30: 2 [IU] via SUBCUTANEOUS
  Administered 2015-06-30: 3 [IU] via SUBCUTANEOUS
  Administered 2015-06-30: 1 [IU] via SUBCUTANEOUS
  Administered 2015-06-30: 5 [IU] via SUBCUTANEOUS

## 2015-06-29 MED ORDER — ISOSORBIDE MONONITRATE ER 30 MG PO TB24
30.0000 mg | ORAL_TABLET | Freq: Every day | ORAL | Status: DC
  Administered 2015-06-30 – 2015-07-02 (×3): 30 mg via ORAL
  Filled 2015-06-29 (×3): qty 1

## 2015-06-29 NOTE — ED Provider Notes (Signed)
CSN: [id number]     Arrival date & time 06/29/15  1115 History   First MD Initiated Contact with Patient 06/29/15 1118     Chief Complaint  Patient presents with  . Shortness of Breath   LEVEL 5 CAVEAT DUE TO ALTERED MENTAL STATUS  Patient is a 82 y.o. female presenting with shortness of breath. The history is provided by the patient.  Shortness of Breath Severity:  Severe Onset quality:  Sudden Timing:  Constant Progression:  Worsening Chronicity:  New Relieved by:  Nothing Worsened by:  Nothing tried Associated symptoms: cough   Patient with h/o HLP/HTN with recent Non-stemi who presents with cough/sob that started earlier today Pt was just recently discharged from hospital She is on hospice and is DNR  No other details are known on my evaluation  Past Medical History  Diagnosis Date  . Hyperlipidemia   . Hypertension   . Diabetes mellitus   . Chronic kidney disease     stage IV  . RAS (renal artery stenosis) ([place])   . Stroke [facility])     s/p CVA unknown timing  . Ischemic cardiomyopathy     EF 20%  . Dementia   . CAD (coronary artery disease)     s/p PTCA May 2005, stenting of an intermittent blanch of the CMX and the angioplasty of teh diagonal branch to the left descending  . Cardiomyopathy, ischemic    Past Surgical History  Procedure Laterality Date  . Appendectomy    . Total abdominal hysterectomy w/ bilateral salpingoophorectomy    . Angioplasty      and stent   Family History  Problem Relation Age of Onset  . Kidney disease Brother    Social History  Substance Use Topics  . Smoking status: Never Smoker   . Smokeless tobacco: Never Used  . Alcohol Use: No   OB History    No data available     Review of Systems  Unable to perform ROS: Mental status change  Respiratory: Positive for cough and shortness of breath.       Allergies  Review of patient's allergies indicates no known allergies.  Home Medications   Prior to Admission medications    Medication Sig Start Date End Date Taking? Authorizing Provider  albuterol (PROVENTIL HFA;VENTOLIN HFA) 108 (90 Base) MCG/ACT inhaler Inhale 2 puffs into the lungs every 6 (six) hours as needed for wheezing or shortness of breath. 06/28/15   Shela Leff, MD  amLODipine (NORVASC) 10 MG tablet TAKE 1 TABLET BY MOUTH EVERY DAY 03/07/15   Milagros Loll, MD  aspirin EC 81 MG EC tablet Take 1 tablet (81 mg total) by mouth daily. 06/27/15   Shela Leff, MD  atorvastatin (LIPITOR) 80 MG tablet Take 1 tablet (80 mg total) by mouth daily at 6 PM. 06/27/15   Shela Leff, MD  BD INSULIN SYRINGE ULTRAFINE 31G X 5/16" 0.3 ML MISC 1 SYRINGE BY DOES NOT APPLY ROUTE 2 (TWO) TIMES DAILY. 04/22/14   Luan Moore, MD  Blood Glucose Monitoring Suppl (ACCU-CHEK AVIVA PLUS) w/Device KIT Use to check blood glucose as instructed. 06/28/15   Shela Leff, MD  carvedilol (COREG) 25 MG tablet Take 1 tablet (25 mg total) by mouth 2 (two) times daily with a meal. 06/28/15   Shela Leff, MD  clopidogrel (PLAVIX) 75 MG tablet Take 1 tablet (75 mg total) by mouth daily. 08/04/14   Milagros Loll, MD  [facility]S IBUPROFEN 200 MG tablet Take 200 mg by  mouth every 4 (four) hours as needed. for pain 05/25/15   Historical Provider, MD  [facility]S SENNA PLUS 8.6-50 MG tablet Take 1 tablet by mouth 2 (two) times daily as needed. For constipation 06/13/15   Historical Provider, MD  glucose blood (ACCU-CHEK AVIVA PLUS) test strip Use to test blood glucose 3 times daily. Dx: E13.9 07/12/14   Luan Moore, MD  hydrALAZINE (APRESOLINE) 25 MG tablet TAKE 1 TABLET (25 MG TOTAL) BY MOUTH 2 (TWO) TIMES DAILY. 05/17/15   Milagros Loll, MD  hydrOXYzine (ATARAX/VISTARIL) 10 MG tablet Take 10 mg by mouth every 6 (six) hours as needed. For itching 06/13/15   Historical Provider, MD  Insulin Isophane & Regular Human (HUMULIN 70/30 KWIKPEN) (70-30) 100 UNIT/ML PEN Inject subcutaneously 15 units in the morning and 6 units in the evening. 04/03/15    Milagros Loll, MD  Insulin Pen Needle (ULTRA-THIN II MINI PEN NEEDLE) 31G X 5 MM MISC Use to inject insulin twice a day. Dx code: E13.9 10/07/14   Milagros Loll, MD  isosorbide mononitrate (IMDUR) 30 MG 24 hr tablet Take 1 tablet (30 mg total) by mouth daily. 06/28/15   Shela Leff, MD  levofloxacin (LEVAQUIN) 250 MG tablet Take 1 tablet by mouth every 48 hours. First dose on 06/30/15. 06/27/15   Shela Leff, MD  temazepam (RESTORIL) 15 MG capsule Take 15 mg by mouth at bedtime as needed and may repeat dose one time if needed. For sleep. 05/25/15   Historical Provider, MD   BP 167/91 mmHg  Pulse 95  Temp(Src) 97.5 F (36.4 C) (Oral)  Resp 31  SpO2 89% Physical Exam CONSTITUTIONAL: Elderly and frail HEAD: Normocephalic/atraumatic EYES: EOMI ENMT: Mucous membranes moist NECK: supple no meningeal signs CV: tachycardic no loud/harsh murmurs LUNGS: tachypneic, coarse breath sounds noted bilaterally ABDOMEN: soft, nontender NEURO: Pt is awake/alert, she is confused  EXTREMITIES: pulses normal/equal, full ROM SKIN: warm, color normal PSYCH: unable to assess   ED Course  Procedures  11:56 AM Pt with recent hospital admission, sent to the ED for cough/sob She has an abnormal CXR She is hypoxic However she is on hospice and DNR, will await family to discuss plan of care 12:44 PM D/w family Plan to treat BP and also diuresis for her CHF Will admit to medical service She is a DNR/DNI 1:19 PM D/w internal medicine Will admit to tele for CHF  Labs Review Labs Reviewed  I-STAT CHEM 8, ED - Abnormal; Notable for the following:    BUN 42 (*)    Creatinine, Ser 4.00 (*)    Glucose, Bld 385 (*)    Calcium, Ion 1.05 (*)    Hemoglobin 10.5 (*)    HCT 31.0 (*)    All other components within normal limits    Imaging Review Dg Chest Portable 1 View  06/29/2015  CLINICAL DATA:  Short of breath, altered mental status EXAM: PORTABLE CHEST 1 VIEW COMPARISON:  06/25/15  FINDINGS: Cardiac enlargement. Increased interstitial prominence bilaterally. Increased bilateral perihilar opacities right worse than left. Increased bilateral lower lobe opacities right greater than left suggesting pleural effusion. IMPRESSION: Findings consistent with congestive heart failure and moderate to severe pulmonary edema Electronically Signed   By: Skipper Cliche M.D.   On: 06/29/2015 12:08   I have personally reviewed and evaluated these images and lab results as part of my medical decision-making.   EKG Interpretation   Date/Time:  Thursday June 29 2015 11:17:33 EDT Ventricular Rate:  98 PR Interval:  136 QRS Duration: 108 QT Interval:  419 QTC Calculation: 535 R Axis:   -43 Text Interpretation:  Sinus rhythm Probable left atrial enlargement LVH  with secondary repolarization abnormality Anterior Q waves, possibly due  to LVH ST depression, consider ischemia, diffuse lds Prolonged QT interval  artifact noted No significant change since last tracing Confirmed by  Christy Gentles  MD, Kacin Dancy ([id number]) on 06/29/2015 11:30:21 AM     Medications  albuterol (PROVENTIL) (2.5 MG/3ML) 0.083% nebulizer solution 5 mg (5 mg Nebulization Given 06/29/15 1202)  furosemide (LASIX) injection 40 mg (40 mg Intravenous Given 06/29/15 1311)  nitroGLYCERIN (NITROGLYN) 2 % ointment 1 inch (1 inch Topical Given 06/29/15 1258)    MDM   Final diagnoses:  Acute systolic congestive heart failure ([place])  Hypoxia  Renal failure    Nursing notes including past medical history and social history reviewed and considered in documentation xrays/imaging reviewed by myself and considered during evaluation Labs/vital reviewed myself and considered during evaluation     Ripley Fraise, MD 06/29/15 1320

## 2015-06-29 NOTE — ED Notes (Signed)
Hold on blood draws and IVs until family arrival per MD Wickline.

## 2015-06-29 NOTE — H&P (Addendum)
Date: 06/29/2015               Patient Name:  [name] MRN: [id number]  DOB: [birth year] Age / Sex: 82 y.o., female   PCP: [name] Loll, MD         Medical Service: Internal Medicine Teaching Service         Attending Physician: Dr. Bartholomew Crews, MD    First Contact: Dr. Marlowe Cook Pager: [phone]  Second Contact: Dr. Arcelia Cook Pager: [phone]       After Hours (After 5p/  First Contact Pager: [phone]  weekends / holidays): Second Contact Pager: [phone]   Chief Complaint: SOB  History of Present Illness: Patient is a 82 yo F with a PMHx of HTN, HLD, DM2, CKD stage IV, CVA, dementia, ischemic cardiomyopathy, CAD s/p PTCA in 07/2003 presenting to the hospital for SOB. History was mostly provided by family as patient was somnolent. Patient denied having any CP, SOB, or abdominal pain. Reported feeling hot. No other complaints. Patient was recently admitted to [facility] and treated for CAP and NSTEMI. She was discharged with home oxygen yesterday (06/28/15). Daughter states patient was feeling well after her discharge and was not dyspneic on 3LO2 via [place]. States she had no problem eating dinner last night and slept all night. Daughter states patient became acutely dyspneic and somnolent this morning despite being on 3L O2 via [place], however, states patient is still following commands and responding to questions. States when she tried giving the patient medications this morning, she vomited. States patient's blood glucose was 534 this morning despite her receiving 6u of Novolin 70/30 last night. Patient's granddaughter was on the phone and stated she gave a the patient "a lot of medications last night" and could not tell us how many or which medications were administered.   On admission, patient was found to be hypoxic with SpO2 89% on RA and tachypnic with RR 31. CXR suggestive of CHF and moderate-severe pulmonary edema. She was given albuterol neb, Lasix 40 mg IV once, and nitroglycerin in the ED.  When we went to see the patient, it was noted that she was satting 94% on 2L O2 via [place] and BP was high (179/87).   Meds: No current facility-administered medications for this encounter.   Current Outpatient Prescriptions  Medication Sig Dispense Refill  . albuterol (PROVENTIL HFA;VENTOLIN HFA) 108 (90 Base) MCG/ACT inhaler Inhale 2 puffs into the lungs every 6 (six) hours as needed for wheezing or shortness of breath. 1 Inhaler 2  . amLODipine (NORVASC) 10 MG tablet TAKE 1 TABLET BY MOUTH EVERY DAY 90 tablet 3  . aspirin EC 81 MG EC tablet Take 1 tablet (81 mg total) by mouth daily. 30 tablet 3  . atorvastatin (LIPITOR) 80 MG tablet Take 1 tablet (80 mg total) by mouth daily at 6 PM. 30 tablet 3  . BD INSULIN SYRINGE ULTRAFINE 31G X 5/16" 0.3 ML MISC 1 SYRINGE BY DOES NOT APPLY ROUTE 2 (TWO) TIMES DAILY. 60 each 3  . Blood Glucose Monitoring Suppl (ACCU-CHEK AVIVA PLUS) w/Device KIT Use to check blood glucose as instructed. 1 kit 0  . carvedilol (COREG) 25 MG tablet Take 1 tablet (25 mg total) by mouth 2 (two) times daily with a meal. 60 tablet 1  . clopidogrel (PLAVIX) 75 MG tablet Take 1 tablet (75 mg total) by mouth daily. 90 tablet 4  . [facility]S IBUPROFEN 200 MG tablet Take 200 mg by mouth every 4 (four) hours  as needed. for pain  2  . CVS SENNA PLUS 8.6-50 MG tablet Take 1 tablet by mouth 2 (two) times daily as needed. For constipation  2  . glucose blood (ACCU-CHEK AVIVA PLUS) test strip Use to test blood glucose 3 times daily. Dx: E13.9 100 each 12  . hydrALAZINE (APRESOLINE) 25 MG tablet TAKE 1 TABLET (25 MG TOTAL) BY MOUTH 2 (TWO) TIMES DAILY. 60 tablet 3  . hydrOXYzine (ATARAX/VISTARIL) 10 MG tablet Take 10 mg by mouth every 6 (six) hours as needed. For itching  1  . Insulin Isophane & Regular Human (HUMULIN 70/30 KWIKPEN) (70-30) 100 UNIT/ML PEN Inject subcutaneously 15 units in the morning and 6 units in the evening. 15 mL 2  . Insulin Pen Needle (ULTRA-THIN II MINI PEN NEEDLE) 31G X 5 MM  MISC Use to inject insulin twice a day. Dx code: E13.9 100 each 2  . isosorbide mononitrate (IMDUR) 30 MG 24 hr tablet Take 1 tablet (30 mg total) by mouth daily. 30 tablet 1  . levofloxacin (LEVAQUIN) 250 MG tablet Take 1 tablet by mouth every 48 hours. First dose on 06/30/15. 3 tablet 0  . temazepam (RESTORIL) 15 MG capsule Take 15 mg by mouth at bedtime as needed and may repeat dose one time if needed. For sleep.  1    Allergies: Allergies as of 06/29/2015  . (No Known Allergies)   Past Medical History  Diagnosis Date  . Hyperlipidemia   . Hypertension   . Diabetes mellitus   . Chronic kidney disease     stage IV  . RAS (renal artery stenosis) ([place])   . Stroke [facility])     s/p CVA unknown timing  . Ischemic cardiomyopathy     EF 20%  . Dementia   . CAD (coronary artery disease)     s/p PTCA May 2005, stenting of an intermittent blanch of the CMX and the angioplasty of teh diagonal branch to the left descending  . Cardiomyopathy, ischemic    Past Surgical History  Procedure Laterality Date  . Appendectomy    . Total abdominal hysterectomy w/ bilateral salpingoophorectomy    . Angioplasty      and stent   Family History  Problem Relation Age of Onset  . Kidney disease Brother    Social History   Social History  . Marital Status: Married    Spouse Name: N/A  . Number of Children: N/A  . Years of Education: N/A   Occupational History  . Not on file.   Social History Main Topics  . Smoking status: Never Smoker   . Smokeless tobacco: Never Used  . Alcohol Use: No  . Drug Use: No  . Sexual Activity: Not on file   Other Topics Concern  . Not on file   Social History Narrative   Patient gets regular exercise- walking   Lives with husband and son; son manages medications      No FH of colon cancer    Review of Systems: Pertinent positives mentioned in HPI. No further history could be obtained from the patient as she was somnolent.    Physical Exam: Blood  pressure 164/91, pulse 95, temperature 97.5 F (36.4 C), temperature source Oral, resp. rate 31, SpO2 95 %. Physical Exam  Constitutional:  Elderly African American female lying is hospital stretcher.   HENT:  Head: Normocephalic and atraumatic.  Mouth/Throat: Oropharynx is clear and moist.  Eyes: EOM are normal. Pupils are equal, round, and reactive to  light.  Neck: Neck supple. No tracheal deviation present.  Cardiovascular: Normal rate, regular rhythm and intact distal pulses.  Exam reveals no gallop and no friction rub.   No murmur heard. Pulmonary/Chest: She has no wheezes.  Increased work of breathing.  Rales up to mid lung fields bilaterally.   Abdominal: Soft. Bowel sounds are normal. She exhibits no distension. There is no tenderness.  Musculoskeletal: She exhibits no edema.  Neurological: She is alert.  Oriented to person only (baseline as per daughter) Responding to questions Following commands  Skin: Skin is warm and dry. No rash noted. She is not diaphoretic. No erythema.    Lab results: Basic Metabolic Panel:  Recent Labs  06/27/15 0245 06/29/15 1303  NA 138 136  K 3.4* 4.0  CL 107 107  CO2 17*  --   GLUCOSE 129* 385*  BUN 39* 42*  CREATININE 4.04* 4.00*  CALCIUM 8.1*  --    CBC:  Recent Labs  06/27/15 0245 06/28/15 0300 06/29/15 1303  WBC 12.7* 15.0*  --   HGB 8.4* 9.8* 10.5*  HCT 25.8* 29.5* 31.0*  MCV 92.1 92.5  --   PLT 249 314  --    CBG:  Recent Labs  06/27/15 1614 06/27/15 2032 06/28/15 0018 06/28/15 0436 06/28/15 0826 06/28/15 1149  GLUCAP 265* 259* 177* 226* 205* 242*   Imaging results:  Dg Chest Portable 1 View  06/29/2015  CLINICAL DATA:  Short of breath, altered mental status EXAM: PORTABLE CHEST 1 VIEW COMPARISON:  06/25/15 FINDINGS: Cardiac enlargement. Increased interstitial prominence bilaterally. Increased bilateral perihilar opacities right worse than left. Increased bilateral lower lobe opacities right greater than  left suggesting pleural effusion. IMPRESSION: Findings consistent with congestive heart failure and moderate to severe pulmonary edema Electronically Signed   By: Skipper Cliche M.D.   On: 06/29/2015 12:08    Other results: EKG: QT prolongation (535 ms). No acute ST/ T wave changes.  Assessment & Plan by Problem: Active Problems:   Acute CHF (congestive heart failure) ([place])  Acute CHF Patient hypoxemic likely due to fluid overload in the setting of acute heart failure exacerbation. SpO2 89% on RA and tachypnic with RR 31. Physical exam remarkable for rales up to mid lung fields bilaterally. EKG did not show any acute ST/ T wave changes. CXR suggestive of CHF and moderate-severe pulmonary edema. Echo done 06/27/15 showing LVEF 94-70%, grade 2 diastolic dysfunction, moderately calcified aortic valve leaflets, moderate mitral regurgitation, mild LA dilation, moderate tricuspid regurgitation, PA peak pressure elevated at 34 mmHg. Her acute heart failure exacerbation is likely in the setting of recent NSTEMI. She is also currently being treated for CAP which could also be contributing to her current symptoms. She was treated with IV antibiotics during her previous admission and discharged with a 7 day course of Levaquin. PE less likely as patient was on Heparin gtt for NSTEMI during previous admission. She was just discharged from the hospital yesterday and is not presenting with any pleuritic chest pain or calf pain/ swelling/ erythema. Well's score 0.  -Admit to tele -She was already given IV Lasix 40 mg in the ED. Will give another dose of IV Lasix 40 mg this evening. Will reassess patient in the morning to decide what dose of lasix she needs to be continued on. -Checking BNP. No baseline in chart.  -Repeat BMP in morning -HOLD beta blocker (home med Carvedilol 25 mg BID) -Continue Amlodipine 10 gm daily -Continue Hydralazine 25 mg BID -Continue Imdur 30  mg daily -Strict I/Os; daily weights    -Palliative care has been consulted to have a GOC discussion with the family. She is a home hospice patient.   CAP: Currently satting well on 2L O2 via [place]. Afebrile. Blood cx showing NGTD (3 days).  -Supplemental O2 to keep O2 sats >92% -Levaquin 500 mg was given 06/28/15. Continue Levaquin 250 mg q48 hrs for another 2 doses. -Albuterol nebs prn -Final blood cx pending   Recent NSTEMI: During her previous admission, patient was found to have NSTEMI. She was treated with Heprain gtt for 48 hrs. She was seen by cardiology and they recommended continuing medical management as patient is not a candidate for catherization due to her advanced dementia and CKD stage 4.  -Aspirin 81 mg daily -Lipitor 80 mg daily  -Plavix 75 mg daily   Hx of CAD and ischemic cardiomyopathy Cath done in 2004 showing severe three vessel disease, mostly in small vessels and more distal vessels. Also showing high grade proximal stenosis in a large mid diagonal. Echo as above.  -Aspirin 81 mg daily -HOLD beta blocker (home med Carvedilol 25 mg BID) -Continue Amlodipine 10 gm daily -Continue Hydralazine 25 mg BID -Continue Imdur 30 mg daily  HTN -HOLD home meds Clonidine  -HOLD beta blocker (home med Carvedilol 25 mg BID) -Continue Amlodipine 10 gm daily -Continue Hydralazine 25 mg BID -Continue Imdur 30 mg daily -She is not on an ACE-I or ARB 2/2 CKD IV  DM2 CBG 385 on admission.  -SSI-sensitive -Lantus 5u qhs   CKD stage 4/5 SCr 4.0; was 3.6 in 11/2014.  -Monitor electrolytes and replete prn -Encourage PO intake   Hx of CVA -Plavix 75 mg daily  Diet: heart healthy  DVT/PE ppx: Subcutaneous Heparin   Code: DNR  Dispo: Disposition is deferred at this time, awaiting improvement of current medical problems. Anticipated discharge in approximately 2-3 day(s).   The patient does have a current PCP [name] Loll, MD) and does need an [facility] hospital follow-up appointment after discharge.  The  patient does not have transportation limitations that hinder transportation to clinic appointments.  Signed: Shela Leff, MD 06/29/2015, 1:40 PM

## 2015-06-29 NOTE — ED Notes (Signed)
Attempted report x1. 

## 2015-06-29 NOTE — Progress Notes (Signed)
[place]- 3E-17  Hospice and Palliative Care RN Visit. This is a GIP, related, covered admission of 06/29/15 per Dr. Tomasa Hosteller with a [facility] dx of Chronic kidney disease.  Patient was readmitted today due to shortness of breath and hyperglycemia.  Patient was just discharged to home on 06/28/15.   Visited patient at bedside.   Daughter [name] as well as multiple other visitors present in room.   Daughter stated her mom was having trouble breathing so she called 911.     Patient is resting.  She did open her eyes briefly and mumbles but speech was not understandable. 02 in place at 2L.  She has received lasix 40mg  IV   [facility] will continue to follow patient and anticipate any discharge needs.  Please call with questions.   Mickie Kay, RN,  [facility]  [phone]

## 2015-06-29 NOTE — ED Notes (Signed)
Per EMS, pt coming from home for increased SOB. Pt was recently d/c from the hospital yesterday for a NSTEMI and Sepsis related to Pneumonia. Pt started experiencing increased SOB this morning. Pt denies pain. Pt has advanced dementia and is at baseline per family.

## 2015-06-30 LAB — CULTURE, BLOOD (ROUTINE X 2)
Culture: NO GROWTH
Culture: NO GROWTH

## 2015-06-30 LAB — BASIC METABOLIC PANEL
ANION GAP: 13 (ref 5–15)
BUN: 41 mg/dL — ABNORMAL HIGH (ref 6–20)
CHLORIDE: 108 mmol/L (ref 101–111)
CO2: 18 mmol/L — AB (ref 22–32)
Calcium: 8.6 mg/dL — ABNORMAL LOW (ref 8.9–10.3)
Creatinine, Ser: 4.08 mg/dL — ABNORMAL HIGH (ref 0.44–1.00)
GFR calc non Af Amer: 9 mL/min — ABNORMAL LOW (ref >60)
GFR, EST AFRICAN AMERICAN: 11 mL/min — AB (ref >60)
Glucose, Bld: 168 mg/dL — ABNORMAL HIGH (ref 65–99)
POTASSIUM: 3.3 mmol/L — AB (ref 3.5–5.1)
Sodium: 139 mmol/L (ref 135–145)

## 2015-06-30 LAB — GLUCOSE, CAPILLARY
GLUCOSE-CAPILLARY: 223 mg/dL — AB (ref 65–99)
GLUCOSE-CAPILLARY: 362 mg/dL — AB (ref 65–99)
Glucose-Capillary: 162 mg/dL — ABNORMAL HIGH (ref 65–99)
Glucose-Capillary: 148 mg/dL — ABNORMAL HIGH (ref 65–99)
Glucose-Capillary: 287 mg/dL — ABNORMAL HIGH (ref 65–99)
Glucose-Capillary: 312 mg/dL — ABNORMAL HIGH (ref 65–99)

## 2015-06-30 MED ORDER — GLUCERNA SHAKE PO LIQD
237.0000 mL | Freq: Two times a day (BID) | ORAL | Status: DC
  Administered 2015-06-30 – 2015-07-02 (×4): 237 mL via ORAL

## 2015-06-30 MED ORDER — INSULIN ASPART PROT & ASPART (70-30 MIX) 100 UNIT/ML ~~LOC~~ SUSP
15.0000 [IU] | Freq: Every day | SUBCUTANEOUS | Status: DC
  Filled 2015-06-30: qty 10

## 2015-06-30 MED ORDER — POTASSIUM CHLORIDE CRYS ER 20 MEQ PO TBCR
20.0000 meq | EXTENDED_RELEASE_TABLET | Freq: Once | ORAL | Status: AC
  Administered 2015-06-30: 20 meq via ORAL
  Filled 2015-06-30: qty 1

## 2015-06-30 MED ORDER — FUROSEMIDE 40 MG PO TABS
60.0000 mg | ORAL_TABLET | Freq: Every day | ORAL | Status: DC
  Administered 2015-06-30 – 2015-07-02 (×3): 60 mg via ORAL
  Filled 2015-06-30 (×3): qty 1

## 2015-06-30 MED ORDER — CARVEDILOL 25 MG PO TABS
25.0000 mg | ORAL_TABLET | Freq: Two times a day (BID) | ORAL | Status: DC
  Administered 2015-06-30: 25 mg via ORAL
  Filled 2015-06-30: qty 1

## 2015-06-30 MED ORDER — INSULIN ASPART PROT & ASPART (70-30 MIX) 100 UNIT/ML ~~LOC~~ SUSP
8.0000 [IU] | Freq: Every day | SUBCUTANEOUS | Status: DC
  Administered 2015-06-30: 8 [IU] via SUBCUTANEOUS

## 2015-06-30 MED ORDER — INSULIN ASPART 100 UNIT/ML ~~LOC~~ SOLN
10.0000 [IU] | Freq: Once | SUBCUTANEOUS | Status: AC
  Administered 2015-06-30: 10 [IU] via SUBCUTANEOUS

## 2015-06-30 NOTE — Progress Notes (Signed)
[place]- 3E-17 Hospice and Palliative Care RN Visit. This is a GIP, related, covered admission of 06/29/15 per Dr. Tomasa Hosteller with a [facility] dx of Chronic kidney disease.  Visited patient at bedside.   Daughter [name] present at bedside, feeding patient breakfast. She is awake and responsive.  She is eating very well.   Patient is not currently wearing her oxygen and respirations are unlabored.  She denies pain.  CBG this morning was 148.  She is on Lantus insulin 5units qhs with SS insulin q4h.  BP elevated at 184/94. Patient is being started on Coreg 25 mg po.  She has a PIV but not currently receiving IVF.  She continues on po Levaquin.   MD arrived and goals of care discussion held with daughter.  Education was provided to daughter regarding end of life and how hospice can support them through this process.      [facility] will continue to follow patient and provide encouragement and support to family.    Please call with questions or concerns.   Mickie Kay, Shelly Bombard,  [facility]  [phone]

## 2015-06-30 NOTE — Progress Notes (Signed)
Initial Nutrition Assessment   INTERVENTION:  Provide Glucerna Shake po BID, each supplement provides 220 kcal and 10 grams of protein   NUTRITION DIAGNOSIS:   Predicted suboptimal nutrient intake related to chronic illness as evidenced by meal completion < 50%, severe depletion of muscle mass.   GOAL:   Patient will meet greater than or equal to 90% of their needs   MONITOR:   PO intake, Supplement acceptance, Labs, Weight trends, Skin  REASON FOR ASSESSMENT:   Malnutrition Screening Tool    ASSESSMENT:   82 yo F with a PMHx of HTN, HLD, DM2, CKD stage IV, CVA, dementia, ischemic cardiomyopathy, CAD s/p PTCA in 07/2003 presenting to the hospital for SOB. History was mostly provided by family as patient was somnolent.   Pt non responsive at time of visit. Family at bedside reports that pt ate normally at home PTA and normally ate breakfast (50%), but lost 1 lbs during recent hospitalization. Pt was given Ensure at breakfast and blood sugar went up to 287. Family agreeable to providing pt with Glucerna Shakes to supplement PO intake and stabilize weight. Per weight history, pt's weight has been stable around 121 to 128 lbs over the past several months.   Labs: low hemoglobin; elevated glucose  Diet Order:  Diet Heart Room service appropriate?: Yes; Fluid consistency:: Thin  Skin:  Reviewed, no issues  Last BM:  4/20  Height:   Ht Readings from Last 1 Encounters:  06/25/15 5\' 4"  (1.626 m)    Weight:   Wt Readings from Last 1 Encounters:  06/30/15 127 lb 14.4 oz (58.015 kg)    Ideal Body Weight:  54.5 kg  BMI:  Body mass index is 21.94 kg/(m^2).  Estimated Nutritional Needs:   Kcal:  1400-1600  Protein:  60-70 grams  Fluid:  1.4-1.6 L/day  EDUCATION NEEDS:   No education needs identified at this time  [place], LDN Inpatient Clinical Dietitian Pager: [phone] After Hours Pager: [phone]

## 2015-06-30 NOTE — Progress Notes (Signed)
  Date: 06/30/2015  Patient name: [name]  Medical record number: [id number]  Date of birth: 02-11-1932   I have seen and evaluated Lysbeth Galas and discussed their care with the Residency Team. Ms Huerta is well known to the [facility] team. She was D/C'd on the 19th after AMI and CAP. She went home on O2 and ABX. On the AM of admit, she had rattling in her chest / throat, was dazed, talking crazy, was diaphoretic, and vomited her meds. Hospice was called but the daughter felt they took too long to respond and she called 911. In the ambulance her O2 was "low" and her CBG "high." She was found to be vol overloaded and was given IV lasix 40 x 2 doses. Today, the daughter states she is at baseline.    Filed Vitals:   06/30/15 0800 06/30/15 1123  BP: 184/94 158/78  Pulse: 94 107  Temp:  99.9 F (37.7 C)  Resp: 16 16  T max 99.9 Gen alert and daughter is feeding her breakfast HRRR Lungs not using accessory muscles. Not tachypnic. Lungs clear anteriorly.  Ext no edema  WBC 12.1 down from 15 Cr stable at 4 but up over past 6 months BNP 3000  I indep reviewed the CXR images and confirmed my reading with the official CXR reading. C/w pul edema  I indep reviewed the EKG and confirmed my reading with the official EKG reading. Sinus, TWI V4-V6 and inf leads. Unchanged.  Assessment and Plan: I have seen and evaluated the patient as outlined above. I agree with the formulated Assessment and Plan as detailed in the residents' note, with the following changes:   1. Acute on chronic ischemic systolic heart failure - She had a recent AMI - Trop not checked this admit, EKG unchanged, but pt is not cath candidate and on max medical tx. Trop I will not change mgmt. No arrhythmia as the cause found yet. Her EF after the AMI this month was 30-35%. She had not been D/C on her lasix bc she was euvolemic and Cr was up from baseline. She has responded well to IV lasix and will be transitioned to her home PO  dose of 60. I explained to the daughter that we will be walking a tightrope btw her kidneys and her heart. She understood but wants treatable things treated. She knows her mother's status is tenuous and bc tearful. We will cont her HF mefs - BB, Imdur, hydral. ACE CI 2/2 renal fxn.  2. CAP - cont her course of PO ABX. I doubt ongoing pul infxn but will follow exam, temp, and WBC.  3. Recent AMI - cont BB, statin, ASA, plavix.   4. CRF - Cr steady at 4, her new baseline. Will need to follow as lasix resumed.  5. Uncontrolled HTN - The imdur was new med and not on long enough to have max effect. Her BP last admit was labile - do not expect strict control and current BP 158/78 acceptable.  6. Dm II - I do not want strict control and would prefer slightly high rather than low. However, both admits have been with CBG's > 400. Daughter questions effectiveness of 70/30. Explained all insulin effective at right dose. Discussed pros and cons of 70/30 vs basal / bolus and she will think about it.  Resume home lasix dose and possible D/C in 1-2 days.  Bartholomew Crews, MD 4/21/201712:27 PM

## 2015-06-30 NOTE — Progress Notes (Signed)
Subjective: No acute overnight events. Denies having any CP or SOB. States she feels tired. No other complaints.   Objective: Vital signs in last 24 hours: Filed Vitals:   06/30/15 0352 06/30/15 0500 06/30/15 0800 06/30/15 1123  BP: 184/90 170/75 184/94 158/78  Pulse: 103 102 94 107  Temp: 99.6 F (37.6 C)   99.9 F (37.7 C)  TempSrc: Oral   Oral  Resp: 21  16 16   Weight: 58.015 kg (127 lb 14.4 oz)     SpO2: 100%  100% 100%   Weight change:   Intake/Output Summary (Last 24 hours) at 06/30/15 1250 Last data filed at 06/30/15 [id number]  Gross per 24 hour  Intake    240 ml  Output      0 ml  Net    240 ml   Physical Exam: Constitutional: Elderly African American female resting comfortably in hospital bed. Eyes: EOMI Cardiovascular: Normal rate, regular rhythm and intact distal pulses. Exam reveals no gallop and no friction rub.  No murmur heard. Pulmonary/Chest: She has no wheezes. Mild bibasilar crackles.  Abdominal: Soft. Bowel sounds are normal. She exhibits no distension. There is no tenderness.  Musculoskeletal: She exhibits no edema.  Neurological: She is alert.  Oriented to person only (baseline as per daughter) Responding to questions Following commands  Skin: Skin is warm and dry. No rash noted. She is not diaphoretic. No erythema.   Lab Results: Basic Metabolic Panel:  Recent Labs Lab 06/26/15 0301 06/27/15 0245 06/29/15 1303 06/30/15 0643  NA 137 138 136 139  K 3.6 3.4* 4.0 3.3*  CL 106 107 107 108  CO2 20* 17*  --  18*  GLUCOSE 230* 129* 385* 168*  BUN 42* 39* 42* 41*  CREATININE 4.09* 4.04* 4.00* 4.08*  CALCIUM 8.3* 8.1*  --  8.6*  MG 1.8  --   --   --   PHOS 3.4  --   --   --    Liver Function Tests:  Recent Labs Lab 06/25/15 0018  AST 58*  ALT 18  ALKPHOS 110  BILITOT 0.8  PROT 8.3*  ALBUMIN 4.0   CBC:  Recent Labs Lab 06/25/15 0018  06/28/15 0300 06/29/15 1303 06/29/15 1650  WBC 16.7*  < > 15.0*  --  12.1*  NEUTROABS  14.6*  --   --   --   --   HGB 10.7*  < > 9.8* 10.5* 10.4*  HCT 30.8*  < > 29.5* 31.0* 31.9*  MCV 90.6  < > 92.5  --  90.9  PLT 392  < > 314  --  262  < > = values in this interval not displayed. Cardiac Enzymes:  Recent Labs Lab 06/25/15 1055  TROPONINI 17.02*   CBG:  Recent Labs Lab 06/29/15 1812 06/29/15 2215 06/30/15 0030 06/30/15 0400 06/30/15 0808 06/30/15 1217  GLUCAP 372* 275* 223* 162* 148* 287*   Fasting Lipid Panel:  Recent Labs Lab 06/26/15 1114  CHOL 181  HDL 41  LDLCALC 121*  TRIG 96  CHOLHDL 4.4   Coagulation:  Recent Labs Lab 06/25/15 0509  LABPROT 13.3  INR 0.99   Urinalysis:  Recent Labs Lab 06/25/15 0115  COLORURINE YELLOW  LABSPEC 1.016  PHURINE 7.0  GLUCOSEU >1000*  HGBUR SMALL*  BILIRUBINUR NEGATIVE  KETONESUR NEGATIVE  PROTEINUR 100*  NITRITE NEGATIVE  LEUKOCYTESUR NEGATIVE   Micro Results: Recent Results (from the past 240 hour(s))  Blood Culture (routine x 2)     Status: None (  Preliminary result)   Collection Time: 06/25/15 12:18 AM  Result Value Ref Range Status   Specimen Description BLOOD LEFT ANTECUBITAL  Final   Special Requests BOTTLES DRAWN AEROBIC AND ANAEROBIC 5CC  Final   Culture   Final    NO GROWTH 4 DAYS Performed at [facility]    Report Status PENDING  Incomplete  Blood Culture (routine x 2)     Status: None (Preliminary result)   Collection Time: 06/25/15 12:52 AM  Result Value Ref Range Status   Specimen Description BLOOD RIGHT ANTECUBITAL  Final   Special Requests BOTTLES DRAWN AEROBIC AND ANAEROBIC 5CC  Final   Culture   Final    NO GROWTH 4 DAYS Performed at [facility]    Report Status PENDING  Incomplete  Urine culture     Status: None   Collection Time: 06/25/15  1:15 AM  Result Value Ref Range Status   Specimen Description URINE, CATHETERIZED  Final   Special Requests NONE  Final   Culture   Final    NO GROWTH 1 DAY Performed at [facility]    Report  Status 06/26/2015 FINAL  Final  MRSA PCR Screening     Status: None   Collection Time: 06/25/15  9:20 AM  Result Value Ref Range Status   MRSA by PCR NEGATIVE NEGATIVE Final    Comment:        The GeneXpert MRSA Assay (FDA approved for NASAL specimens only), is one component of a comprehensive MRSA colonization surveillance program. It is not intended to diagnose MRSA infection nor to guide or monitor treatment for MRSA infections.    Studies/Results: Dg Chest Portable 1 View  06/29/2015  CLINICAL DATA:  Short of breath, altered mental status EXAM: PORTABLE CHEST 1 VIEW COMPARISON:  06/25/15 FINDINGS: Cardiac enlargement. Increased interstitial prominence bilaterally. Increased bilateral perihilar opacities right worse than left. Increased bilateral lower lobe opacities right greater than left suggesting pleural effusion. IMPRESSION: Findings consistent with congestive heart failure and moderate to severe pulmonary edema Electronically Signed   By: Skipper Cliche M.D.   On: 06/29/2015 12:08   Medications: I have reviewed the patient's current medications. Scheduled Meds: . amLODipine  10 mg Oral Daily  . aspirin EC  81 mg Oral Daily  . atorvastatin  80 mg Oral q1800  . carvedilol  25 mg Oral BID WC  . clopidogrel  75 mg Oral Daily  . feeding supplement (GLUCERNA SHAKE)  237 mL Oral BID BM  . heparin  5,000 Units Subcutaneous Q8H  . hydrALAZINE  25 mg Oral BID  . insulin aspart  0-9 Units Subcutaneous Q4H  . insulin glargine  5 Units Subcutaneous QHS  . isosorbide mononitrate  30 mg Oral Daily  . levofloxacin  250 mg Oral Q48H  . sodium chloride flush  3 mL Intravenous Q12H   Continuous Infusions:  PRN Meds:.senna-docusate Assessment/Plan: Principal Problem:   Acute exacerbation of CHF (congestive heart failure) (HCC) Active Problems:   Diabetes 1.5, managed as type 2 ([employer])   Hyperlipidemia   Essential hypertension   Coronary atherosclerosis   KIDNEY DISEASE, CHRONIC,  STAGE IV   Severe dementia   CAP (community acquired pneumonia)   Acute CHF (congestive heart failure) ([place])  Acute CHF exacerbation  Echo done 06/27/15 showing LVEF 30-35%. Currently satting 93-100% on 2L O2 via [place]. Mild bibasilar rales on exam today. No lower extremity edema. Received 80 mg IV Lasix since admission.  -Monitor on telemetry -D/c  IV Lasix. Continue patient on home dose of oral Lasix 60 mg daily. -Continue Carvedilol 25 mg BID -Continue Amlodipine 10 gm daily -Continue Hydralazine 25 mg BID -Continue Imdur 30 mg daily -Strict I/Os; daily weights  -BMP in a.m.   CAP: Currently satting well on 2L O2 via [place]. Afebrile. -Supplemental O2 to keep O2 sats >92% -Levaquin 500 mg was given 06/28/15. Continue Levaquin 250 mg q48 hrs for another 2 doses. -Albuterol nebs prn  Hx of CAD and ischemic cardiomyopathy  Cath done in 2004 showing severe three vessel disease, mostly in small vessels and more distal vessels. Also showing high grade proximal stenosis in a large mid diagonal. Echo as above.  -Aspirin 81 mg daily -Continue Carvedilol 25 mg BID -Continue Amlodipine 10 gm daily -Continue Hydralazine 25 mg BID -Continue Imdur 30 mg daily  HTN -HOLD home meds Clonidine  -Continue Carvedilol 25 mg BID -Continue Amlodipine 10 gm daily -Continue Hydralazine 25 mg BID -Continue Imdur 30 mg daily -She is not on an ACE-I or ARB 2/2 CKD IV  DM2 CBG in 100s this morning. On Novolog 70/30 15u with breakfast and 8u with dinner.   -Continue home regimen.   -D/c Lantus and SSI  CKD stage 4/5 SCr 4.0; was 3.6 in 11/2014.  -Monitor electrolytes and replete prn -Encourage PO intake   Hx of CVA -Plavix 75 mg daily  Diet: heart healthy  DVT/PE ppx: Subcutaneous Heparin   Code: DNR  Dispo: Disposition is deferred at this time, awaiting improvement of current medical problems.  Anticipated discharge in approximately 1-2 day(s).   The patient does have a current PCP  Milagros Loll, MD) and does need an [facility] hospital follow-up appointment after discharge.  The patient does not have transportation limitations that hinder transportation to clinic appointments.  .Services Needed at time of discharge: Y = Yes, Blank = No PT:   OT:   RN:   Equipment:   Other:     LOS: 1 day   Shela Leff, MD 06/30/2015, 12:50 PM

## 2015-06-30 NOTE — Clinical Documentation Improvement (Signed)
Family Medicine  Based on the clinical findings below, please document any associated diagnoses/conditions the patient has or may have.   Diabetes with hyperglycemia  Other  Clinically Undetermined  Supporting Information: Noted to have blood sugar of 534 and on admission was 372   Please exercise your independent, professional judgment when responding. A specific answer is not anticipated or expected.   Thank You, [place] [phone]

## 2015-06-30 NOTE — Clinical Social Work Note (Signed)
CSW acknowledges consult for social work. Case management will be consulted for home hospice.  CSW signing off for this patient. Consult again if any social work needs arise.  Dayton Scrape, [place]

## 2015-07-01 LAB — CBC
HCT: 24.6 % — ABNORMAL LOW (ref 36.0–46.0)
HEMOGLOBIN: 8 g/dL — AB (ref 12.0–15.0)
MCH: 30.8 pg (ref 26.0–34.0)
MCHC: 32.5 g/dL (ref 30.0–36.0)
MCV: 94.6 fL (ref 78.0–100.0)
Platelets: 293 10*3/uL (ref 150–400)
RBC: 2.6 MIL/uL — AB (ref 3.87–5.11)
RDW: 12.3 % (ref 11.5–15.5)
WBC: 18.7 10*3/uL — AB (ref 4.0–10.5)

## 2015-07-01 LAB — GLUCOSE, CAPILLARY
GLUCOSE-CAPILLARY: 129 mg/dL — AB (ref 65–99)
Glucose-Capillary: 104 mg/dL — ABNORMAL HIGH (ref 65–99)
Glucose-Capillary: 212 mg/dL — ABNORMAL HIGH (ref 65–99)
Glucose-Capillary: 278 mg/dL — ABNORMAL HIGH (ref 65–99)
Glucose-Capillary: 379 mg/dL — ABNORMAL HIGH (ref 65–99)
Glucose-Capillary: 95 mg/dL (ref 65–99)

## 2015-07-01 LAB — COMPREHENSIVE METABOLIC PANEL
ALBUMIN: 2.6 g/dL — AB (ref 3.5–5.0)
ALT: 15 U/L (ref 14–54)
ANION GAP: 13 (ref 5–15)
AST: 26 U/L (ref 15–41)
Alkaline Phosphatase: 62 U/L (ref 38–126)
BUN: 38 mg/dL — ABNORMAL HIGH (ref 6–20)
CALCIUM: 8.4 mg/dL — AB (ref 8.9–10.3)
CO2: 19 mmol/L — AB (ref 22–32)
CREATININE: 4.13 mg/dL — AB (ref 0.44–1.00)
Chloride: 108 mmol/L (ref 101–111)
GFR calc Af Amer: 11 mL/min — ABNORMAL LOW (ref >60)
GFR calc non Af Amer: 9 mL/min — ABNORMAL LOW (ref >60)
GLUCOSE: 67 mg/dL (ref 65–99)
Potassium: 4 mmol/L (ref 3.5–5.1)
SODIUM: 140 mmol/L (ref 135–145)
Total Bilirubin: 0.8 mg/dL (ref 0.3–1.2)
Total Protein: 6 g/dL — ABNORMAL LOW (ref 6.5–8.1)

## 2015-07-01 MED ORDER — INSULIN GLARGINE 100 UNIT/ML ~~LOC~~ SOLN
7.0000 [IU] | Freq: Every day | SUBCUTANEOUS | Status: DC
  Administered 2015-07-01: 7 [IU] via SUBCUTANEOUS
  Filled 2015-07-01 (×2): qty 0.07

## 2015-07-01 MED ORDER — CARVEDILOL 25 MG PO TABS
25.0000 mg | ORAL_TABLET | Freq: Two times a day (BID) | ORAL | Status: DC
  Administered 2015-07-01 – 2015-07-02 (×3): 25 mg via ORAL
  Filled 2015-07-01 (×3): qty 1

## 2015-07-01 MED ORDER — INSULIN ASPART PROT & ASPART (70-30 MIX) 100 UNIT/ML ~~LOC~~ SUSP
15.0000 [IU] | Freq: Every day | SUBCUTANEOUS | Status: DC
  Filled 2015-07-01: qty 10

## 2015-07-01 MED ORDER — INSULIN ASPART 100 UNIT/ML ~~LOC~~ SOLN
0.0000 [IU] | Freq: Three times a day (TID) | SUBCUTANEOUS | Status: DC
Start: 2015-07-01 — End: 2015-07-02
  Administered 2015-07-01: 5 [IU] via SUBCUTANEOUS
  Administered 2015-07-02: 3 [IU] via SUBCUTANEOUS

## 2015-07-01 MED ORDER — INSULIN ASPART 100 UNIT/ML ~~LOC~~ SOLN
0.0000 [IU] | Freq: Every day | SUBCUTANEOUS | Status: DC
  Administered 2015-07-01: 5 [IU] via SUBCUTANEOUS

## 2015-07-01 NOTE — Progress Notes (Signed)
[facility] SW GIP visit.  This is an [facility] GIP related and covered admission since 06/29/15 per Dr. Tomasa Hosteller with [facility] diagnosis of Chronic Kidney Disease. Code Status is DNR. Patient was asleep during this visit with daughter [name] at bedside. [name] reports patient at one strawberry, one fourth her french toast and most of her ensure this morning. She confirms plan for patient to return home with hospice services to home of daughter [name]. She tells me MD still working to get blood sugar controlled. Spoke with MD by phone to confirm plan with possible return home tomorrow.   [facility] will continue to follow daily.   Please fax discharge summary to [phone] when patient discharges home.  Please call [phone] to make [facility] aware of discharge from unit.   Thank you.  Erling Conte, [place]

## 2015-07-01 NOTE — Progress Notes (Signed)
Subjective: She became low overnight after receiving an additional 10 units novolog for an elevated blood sugar of 360. Her blood sugar is now 104 this morning. Daughter reports she was complaining of some abdominal pain last night.  Objective: Vital signs in last 24 hours: Filed Vitals:   06/30/15 0800 06/30/15 1123 06/30/15 2020 07/01/15 0355  BP: 184/94 158/78 147/66 157/79  Pulse: 94 107 90 85  Temp:  99.9 F (37.7 C) 98.3 F (36.8 C) 98.8 F (37.1 C)  TempSrc:  Oral Oral Oral  Resp: 16 16 16 18   Weight:    130 lb 3.2 oz (59.058 kg)  SpO2: 100% 100% 96% 94%   Weight change: 2 lb 4.8 oz (1.043 kg)  Intake/Output Summary (Last 24 hours) at 07/01/15 0813 Last data filed at 06/30/15 1836  Gross per 24 hour  Intake    180 ml  Output      0 ml  Net    180 ml   Physical Exam: General: Elderly African American woman resting comfortably in bed HEENT: [place]/AT, EOMI, sclera anicteric, mucus membranes moist CV: RRR, no m/g/r Pulm: CTA bilaterally, breaths non-labored Abd: BS+, soft, non-distended. Difficult to differentiate if she is having mild abdominal pain or if she is bothered by my examination.  Musculoskeletal: She exhibits no edema.  Neurological: She is alert and oriented to person only (baseline). Answers some questions. Skin: Skin is warm and dry. No rash noted. She is not diaphoretic. No erythema.   Lab Results: Basic Metabolic Panel:  Recent Labs Lab 06/26/15 0301  06/30/15 0643 07/01/15 0250  NA 137  < > 139 140  K 3.6  < > 3.3* 4.0  CL 106  < > 108 108  CO2 20*  < > 18* 19*  GLUCOSE 230*  < > 168* 67  BUN 42*  < > 41* 38*  CREATININE 4.09*  < > 4.08* 4.13*  CALCIUM 8.3*  < > 8.6* 8.4*  MG 1.8  --   --   --   PHOS 3.4  --   --   --   < > = values in this interval not displayed. Liver Function Tests:  Recent Labs Lab 06/25/15 0018 07/01/15 0250  AST 58* 26  ALT 18 15  ALKPHOS 110 62  BILITOT 0.8 0.8  PROT 8.3* 6.0*  ALBUMIN 4.0 2.6*    CBC:  Recent Labs Lab 06/25/15 0018  06/29/15 1650 07/01/15 0250  WBC 16.7*  < > 12.1* 18.7*  NEUTROABS 14.6*  --   --   --   HGB 10.7*  < > 10.4* 8.0*  HCT 30.8*  < > 31.9* 24.6*  MCV 90.6  < > 90.9 94.6  PLT 392  < > 262 293  < > = values in this interval not displayed. Cardiac Enzymes:  Recent Labs Lab 06/25/15 1055  TROPONINI 17.02*   CBG:  Recent Labs Lab 06/30/15 0808 06/30/15 1217 06/30/15 1642 06/30/15 1939 06/30/15 2353 07/01/15 0339  GLUCAP 148* 287* 312* 362* 95 104*   Fasting Lipid Panel:  Recent Labs Lab 06/26/15 1114  CHOL 181  HDL 41  LDLCALC 121*  TRIG 96  CHOLHDL 4.4   Coagulation:  Recent Labs Lab 06/25/15 0509  LABPROT 13.3  INR 0.99   Urinalysis:  Recent Labs Lab 06/25/15 0115  COLORURINE YELLOW  LABSPEC 1.016  PHURINE 7.0  GLUCOSEU >1000*  HGBUR SMALL*  BILIRUBINUR NEGATIVE  KETONESUR NEGATIVE  PROTEINUR 100*  NITRITE NEGATIVE  LEUKOCYTESUR  NEGATIVE   Micro Results: Recent Results (from the past 240 hour(s))  Blood Culture (routine x 2)     Status: None   Collection Time: 06/25/15 12:18 AM  Result Value Ref Range Status   Specimen Description BLOOD LEFT ANTECUBITAL  Final   Special Requests BOTTLES DRAWN AEROBIC AND ANAEROBIC 5CC  Final   Culture   Final    NO GROWTH 5 DAYS Performed at [facility]    Report Status 06/30/2015 FINAL  Final  Blood Culture (routine x 2)     Status: None   Collection Time: 06/25/15 12:52 AM  Result Value Ref Range Status   Specimen Description BLOOD RIGHT ANTECUBITAL  Final   Special Requests BOTTLES DRAWN AEROBIC AND ANAEROBIC 5CC  Final   Culture   Final    NO GROWTH 5 DAYS Performed at [facility]    Report Status 06/30/2015 FINAL  Final  Urine culture     Status: None   Collection Time: 06/25/15  1:15 AM  Result Value Ref Range Status   Specimen Description URINE, CATHETERIZED  Final   Special Requests NONE  Final   Culture   Final    NO GROWTH 1  DAY Performed at [facility]    Report Status 06/26/2015 FINAL  Final  MRSA PCR Screening     Status: None   Collection Time: 06/25/15  9:20 AM  Result Value Ref Range Status   MRSA by PCR NEGATIVE NEGATIVE Final    Comment:        The GeneXpert MRSA Assay (FDA approved for NASAL specimens only), is one component of a comprehensive MRSA colonization surveillance program. It is not intended to diagnose MRSA infection nor to guide or monitor treatment for MRSA infections.    Studies/Results: Dg Chest Portable 1 View  06/29/2015  CLINICAL DATA:  Short of breath, altered mental status EXAM: PORTABLE CHEST 1 VIEW COMPARISON:  06/25/15 FINDINGS: Cardiac enlargement. Increased interstitial prominence bilaterally. Increased bilateral perihilar opacities right worse than left. Increased bilateral lower lobe opacities right greater than left suggesting pleural effusion. IMPRESSION: Findings consistent with congestive heart failure and moderate to severe pulmonary edema Electronically Signed   By: Skipper Cliche M.D.   On: 06/29/2015 12:08   Medications: I have reviewed the patient's current medications. Scheduled Meds: . amLODipine  10 mg Oral Daily  . aspirin EC  81 mg Oral Daily  . atorvastatin  80 mg Oral q1800  . carvedilol  25 mg Oral BID WC  . clopidogrel  75 mg Oral Daily  . feeding supplement (GLUCERNA SHAKE)  237 mL Oral BID BM  . furosemide  60 mg Oral Daily  . heparin  5,000 Units Subcutaneous Q8H  . hydrALAZINE  25 mg Oral BID  . insulin aspart protamine- aspart  15 Units Subcutaneous Q breakfast  . insulin aspart protamine- aspart  8 Units Subcutaneous Q supper  . isosorbide mononitrate  30 mg Oral Daily  . levofloxacin  250 mg Oral Q48H  . sodium chloride flush  3 mL Intravenous Q12H   Continuous Infusions:  PRN Meds:.senna-docusate Assessment/Plan:  Acute CHF exacerbation: Echo done 06/27/15 showing LVEF 30-35%. Currently satting 94% on room air. No crackles  on exam today. No lower extremity edema. UOP not being recorded. Daughter states she urinated both yesterday morning and evening. - Monitor on telemetry - Continue home dose of oral Lasix 60 mg daily. - Continue Carvedilol 25 mg BID - Continue Amlodipine 10 mg daily -  Continue Hydralazine 25 mg BID - Continue Imdur 30 mg daily - Strict I/Os; daily weights   CAP: Currently satting well on room air. Afebrile. - Supplemental O2 as needed to keep O2 sats >92% - Levaquin 500 mg was given 06/28/15. Continue Levaquin 250 mg q48 hrs (last dose tomorrow)  Hx of CAD and ischemic cardiomyopathy: Cath done in 2004 showing severe three vessel disease, mostly in small vessels and more distal vessels. Also showing high grade proximal stenosis in a large mid diagonal. Echo as above.  - Continue Aspirin 81 mg daily - Continue Carvedilol 25 mg BID - Continue Amlodipine 10 gm daily - Continue Hydralazine 25 mg BID - Continue Imdur 30 mg daily  HTN: BP [id number] systolic.  - HOLD home Clonidine  - Continue Carvedilol 25 mg BID - Continue Amlodipine 10 gm daily - Continue Hydralazine 25 mg BID - Continue Imdur 30 mg daily - She is not on an ACE-I or ARB 2/2 CKD IV  Type 2 DM: CBG in 100s this morning. She did get hypoglycemic overnight with extra Novolog on top of her 70/30. Will monitor her blood sugars this morning and afternoon and if stable will send home on new regimen.  - Continue Novolog 70/30 15 units with breakfast  - Continue Novolog 70/30 8 units with dinner.    CKD stage 4/5: SCr stable in 4.0 range; was 3.6 in 11/2014.  - Monitor electrolytes and replete prn - Encourage PO intake   Hx of CVA - Continue Plavix 75 mg daily  Diet: heart healthy DVT/PE ppx: Subcutaneous Heparin  Code: DNR Dispo: Discharge likely today or tomorrow   The patient does have a current PCP Milagros Loll, MD) and does need an [facility] hospital follow-up appointment after discharge.  The patient does  not have transportation limitations that hinder transportation to clinic appointments.  .Services Needed at time of discharge: Y = Yes, Blank = No PT:   OT:   RN:   Equipment:   Other:     LOS: 2 days   Juliet Rude, MD 07/01/2015, 8:13 AM

## 2015-07-02 LAB — BASIC METABOLIC PANEL
Anion gap: 12 (ref 5–15)
BUN: 43 mg/dL — ABNORMAL HIGH (ref 6–20)
CHLORIDE: 102 mmol/L (ref 101–111)
CO2: 19 mmol/L — AB (ref 22–32)
Calcium: 8.2 mg/dL — ABNORMAL LOW (ref 8.9–10.3)
Creatinine, Ser: 4.07 mg/dL — ABNORMAL HIGH (ref 0.44–1.00)
GFR calc non Af Amer: 9 mL/min — ABNORMAL LOW (ref >60)
GFR, EST AFRICAN AMERICAN: 11 mL/min — AB (ref >60)
Glucose, Bld: 251 mg/dL — ABNORMAL HIGH (ref 65–99)
Potassium: 3.8 mmol/L (ref 3.5–5.1)
Sodium: 133 mmol/L — ABNORMAL LOW (ref 135–145)

## 2015-07-02 LAB — CBC
HEMATOCRIT: 24.1 % — AB (ref 36.0–46.0)
HEMOGLOBIN: 7.6 g/dL — AB (ref 12.0–15.0)
MCH: 29.9 pg (ref 26.0–34.0)
MCHC: 31.5 g/dL (ref 30.0–36.0)
MCV: 94.9 fL (ref 78.0–100.0)
Platelets: 299 10*3/uL (ref 150–400)
RBC: 2.54 MIL/uL — ABNORMAL LOW (ref 3.87–5.11)
RDW: 12.5 % (ref 11.5–15.5)
WBC: 16.7 10*3/uL — ABNORMAL HIGH (ref 4.0–10.5)

## 2015-07-02 LAB — GLUCOSE, CAPILLARY
Glucose-Capillary: 207 mg/dL — ABNORMAL HIGH (ref 65–99)
Glucose-Capillary: 176 mg/dL — ABNORMAL HIGH (ref 65–99)

## 2015-07-02 MED ORDER — GLUCERNA SHAKE PO LIQD: 237.0000 mL | Freq: Two times a day (BID) | ORAL | Status: AC

## 2015-07-02 MED ORDER — INSULIN ISOPHANE & REGULAR (HUMAN 70-30)100 UNIT/ML KWIKPEN: PEN_INJECTOR | SUBCUTANEOUS | Status: AC

## 2015-07-02 MED ORDER — FUROSEMIDE 40 MG PO TABS: 60.0000 mg | ORAL_TABLET | Freq: Every day | ORAL | Status: AC

## 2015-07-02 NOTE — Discharge Instructions (Signed)
It was a pleasure taking care of you, [name]. You were hospitalized for an acute on chronic congestive heart failure exacerbation.  - Take Lasix 60 mg daily to make sure you do not become fluid overloaded with your heart failure again - Change your insulin to Humulin 70/30 15 units in the morning and 8 units in the evening. Your first dose at home should be 7PM tonight with the 8 units. Take your morning dose around 7 AM with breakfast.  - Call the hospice nurses first if there are any questions about your health (blood sugars, medications, oxygen). If you do not hear from them and it is a weekday then please call the Internal Medicine Clinic at [phone].  - Please see Dr. [name] in the clinic at 10:15 AM on April 25th.  Take care, Dr. Arcelia Jew

## 2015-07-02 NOTE — Care Management (Signed)
CM notified [facility] of discharge today. [facility] contacted for transport home. Presenter, broadcasting BSN CCM

## 2015-07-02 NOTE — Discharge Summary (Signed)
Subjective: No acute events overnight. Blood sugars better this morning. She is much more alert and talkative.   Objective: Vital signs in last 24 hours: Filed Vitals:   07/01/15 1240 07/01/15 2100 07/02/15 0600 07/02/15 0700  BP: 104/89 146/64  169/90  Pulse: 79 81 81 74  Temp: 98.9 F (37.2 C) 98.4 F (36.9 C) 98.7 F (37.1 C)   TempSrc: Oral Oral Oral   Resp: 20 20 20    Weight:      SpO2: 96% 99% 100%    Weight change:   Intake/Output Summary (Last 24 hours) at 07/02/15 0857 Last data filed at 07/02/15 0700  Gross per 24 hour  Intake    320 ml  Output    800 ml  Net   -480 ml   Physical Exam: General: Elderly African American woman resting comfortably in bed HEENT: [place]/AT, EOMI, sclera anicteric, mucus membranes moist CV: RRR, no m/g/r Pulm: CTA bilaterally, breaths non-labored Abd: BS+, soft, non-distended, non-tender  Musculoskeletal: She exhibits no edema.  Neurological: She is alert and oriented to person only (baseline). Answers some questions. Skin: Skin is warm and dry. No rash noted. She is not diaphoretic. No erythema.   Lab Results: Basic Metabolic Panel:  Recent Labs Lab 06/26/15 0301  07/01/15 0250 07/02/15 0313  NA 137  < > 140 133*  K 3.6  < > 4.0 3.8  CL 106  < > 108 102  CO2 20*  < > 19* 19*  GLUCOSE 230*  < > 67 251*  BUN 42*  < > 38* 43*  CREATININE 4.09*  < > 4.13* 4.07*  CALCIUM 8.3*  < > 8.4* 8.2*  MG 1.8  --   --   --   PHOS 3.4  --   --   --   < > = values in this interval not displayed. Liver Function Tests:  Recent Labs Lab 07/01/15 0250  AST 26  ALT 15  ALKPHOS 62  BILITOT 0.8  PROT 6.0*  ALBUMIN 2.6*   CBC:  Recent Labs Lab 07/01/15 0250 07/02/15 0313  WBC 18.7* 16.7*  HGB 8.0* 7.6*  HCT 24.6* 24.1*  MCV 94.6 94.9  PLT 293 299   Cardiac Enzymes:  Recent Labs Lab 06/25/15 1055  TROPONINI 17.02*   CBG:  Recent Labs Lab 07/01/15 0812 07/01/15 1203 07/01/15 1619 07/01/15 2132 07/02/15 0601  07/02/15 0813  GLUCAP 129* 212* 278* 379* 207* 176*   Fasting Lipid Panel:  Recent Labs Lab 06/26/15 1114  CHOL 181  HDL 41  LDLCALC 121*  TRIG 96  CHOLHDL 4.4    Micro Results: Recent Results (from the past 240 hour(s))  Blood Culture (routine x 2)     Status: None   Collection Time: 06/25/15 12:18 AM  Result Value Ref Range Status   Specimen Description BLOOD LEFT ANTECUBITAL  Final   Special Requests BOTTLES DRAWN AEROBIC AND ANAEROBIC 5CC  Final   Culture   Final    NO GROWTH 5 DAYS Performed at [facility]    Report Status 06/30/2015 FINAL  Final  Blood Culture (routine x 2)     Status: None   Collection Time: 06/25/15 12:52 AM  Result Value Ref Range Status   Specimen Description BLOOD RIGHT ANTECUBITAL  Final   Special Requests BOTTLES DRAWN AEROBIC AND ANAEROBIC 5CC  Final   Culture   Final    NO GROWTH 5 DAYS Performed at [facility]    Report Status  06/30/2015 FINAL  Final  Urine culture     Status: None   Collection Time: 06/25/15  1:15 AM  Result Value Ref Range Status   Specimen Description URINE, CATHETERIZED  Final   Special Requests NONE  Final   Culture   Final    NO GROWTH 1 DAY Performed at [facility]    Report Status 06/26/2015 FINAL  Final  MRSA PCR Screening     Status: None   Collection Time: 06/25/15  9:20 AM  Result Value Ref Range Status   MRSA by PCR NEGATIVE NEGATIVE Final    Comment:        The GeneXpert MRSA Assay (FDA approved for NASAL specimens only), is one component of a comprehensive MRSA colonization surveillance program. It is not intended to diagnose MRSA infection nor to guide or monitor treatment for MRSA infections.    Medications: I have reviewed the patient's current medications. Scheduled Meds: . amLODipine  10 mg Oral Daily  . aspirin EC  81 mg Oral Daily  . atorvastatin  80 mg Oral q1800  . carvedilol  25 mg Oral BID WC  . clopidogrel  75 mg Oral Daily  . feeding supplement  (GLUCERNA SHAKE)  237 mL Oral BID BM  . furosemide  60 mg Oral Daily  . heparin  5,000 Units Subcutaneous Q8H  . hydrALAZINE  25 mg Oral BID  . insulin aspart  0-5 Units Subcutaneous QHS  . insulin aspart  0-9 Units Subcutaneous TID WC  . insulin glargine  7 Units Subcutaneous QHS  . isosorbide mononitrate  30 mg Oral Daily  . levofloxacin  250 mg Oral Q48H  . sodium chloride flush  3 mL Intravenous Q12H   Continuous Infusions:  PRN Meds:.senna-docusate Assessment/Plan:  Acute CHF exacerbation: Echo done 06/27/15 showing LVEF 30-35%. Currently satting 100% on room air. No crackles on exam today. No lower extremity edema. UOP 800 over last 24 hours. She is stable and can be discharged with home hospice today.  - Monitor on telemetry - Continue home dose of oral Lasix 60 mg daily. - Continue Carvedilol 25 mg BID - Continue Amlodipine 10 mg daily - Continue Hydralazine 25 mg BID - Continue Imdur 30 mg daily - Strict I/Os; daily weights   CAP: Currently satting well on room air. Afebrile. - Supplemental O2 as needed to keep O2 sats >92% - Last dose Levaquin 250 mg daily today  Hx of CAD and ischemic cardiomyopathy: Cath done in 2004 showing severe three vessel disease, mostly in small vessels and more distal vessels. Also showing high grade proximal stenosis in a large mid diagonal. Echo as above.  - Continue Aspirin 81 mg daily - Continue Carvedilol 25 mg BID - Continue Amlodipine 10 gm daily - Continue Hydralazine 25 mg BID - Continue Imdur 30 mg daily  HTN: BP [id number] systolic.  - HOLD home Clonidine  - Continue Carvedilol 25 mg BID - Continue Amlodipine 10 gm daily - Continue Hydralazine 25 mg BID - Continue Imdur 30 mg daily - She is not on an ACE-I or ARB 2/2 CKD IV  Type 2 DM: Blood sugars stable in 100s-200s. Will discharge her on a regimen of 70/30 insulin.  - Switch to Novolog 15 units in AM and 8 units in PM on discharge   CKD stage 4/5: SCr stable in 4.0  range; was 3.6 in 11/2014.  - Monitor electrolytes and replete prn - Encourage PO intake   Hx of CVA - Continue  Plavix 75 mg daily  Diet: heart healthy DVT/PE ppx: Subcutaneous Heparin  Code: DNR Dispo: Discharge today  The patient does have a current PCP [name] Loll, MD) and does need an [facility] hospital follow-up appointment after discharge.  The patient does not have transportation limitations that hinder transportation to clinic appointments.  .Services Needed at time of discharge: Y = Yes, Blank = No PT:   OT:   RN:   Equipment:   Other:     LOS: 3 days   Juliet Rude, MD 07/02/2015, 8:57 AM

## 2015-07-04 ENCOUNTER — Encounter: Payer: Self-pay | Admitting: Pulmonary Disease

## 2015-07-04 ENCOUNTER — Ambulatory Visit (INDEPENDENT_AMBULATORY_CARE_PROVIDER_SITE_OTHER): Admitting: Pulmonary Disease

## 2015-07-04 ENCOUNTER — Observation Stay (HOSPITAL_COMMUNITY)
Admission: AD | Admit: 2015-07-04 | Discharge: 2015-07-05 | Disposition: A | Discharge status: Hospice | Source: Ambulatory Visit | Attending: Internal Medicine | Admitting: Internal Medicine

## 2015-07-04 VITALS — BP 91/49 | HR 69 | Ht 64.0 in

## 2015-07-04 DIAGNOSIS — Z8673 Personal history of transient ischemic attack (TIA), and cerebral infarction without residual deficits: Secondary | ICD-10-CM | POA: Insufficient documentation

## 2015-07-04 DIAGNOSIS — G9340 Encephalopathy, unspecified: Secondary | ICD-10-CM

## 2015-07-04 DIAGNOSIS — N184 Chronic kidney disease, stage 4 (severe): Secondary | ICD-10-CM | POA: Diagnosis not present

## 2015-07-04 DIAGNOSIS — M545 Low back pain: Secondary | ICD-10-CM | POA: Insufficient documentation

## 2015-07-04 DIAGNOSIS — E1365 Other specified diabetes mellitus with hyperglycemia: Secondary | ICD-10-CM | POA: Diagnosis not present

## 2015-07-04 DIAGNOSIS — R400 Somnolence: Principal | ICD-10-CM

## 2015-07-04 DIAGNOSIS — I255 Ischemic cardiomyopathy: Secondary | ICD-10-CM | POA: Insufficient documentation

## 2015-07-04 DIAGNOSIS — I130 Hypertensive heart and chronic kidney disease with heart failure and stage 1 through stage 4 chronic kidney disease, or unspecified chronic kidney disease: Secondary | ICD-10-CM | POA: Diagnosis not present

## 2015-07-04 DIAGNOSIS — I10 Essential (primary) hypertension: Secondary | ICD-10-CM | POA: Diagnosis not present

## 2015-07-04 DIAGNOSIS — E785 Hyperlipidemia, unspecified: Secondary | ICD-10-CM | POA: Insufficient documentation

## 2015-07-04 DIAGNOSIS — F0150 Vascular dementia without behavioral disturbance: Secondary | ICD-10-CM | POA: Diagnosis not present

## 2015-07-04 DIAGNOSIS — E139 Other specified diabetes mellitus without complications: Secondary | ICD-10-CM

## 2015-07-04 DIAGNOSIS — I5022 Chronic systolic (congestive) heart failure: Secondary | ICD-10-CM | POA: Insufficient documentation

## 2015-07-04 DIAGNOSIS — E1122 Type 2 diabetes mellitus with diabetic chronic kidney disease: Secondary | ICD-10-CM | POA: Insufficient documentation

## 2015-07-04 LAB — GLUCOSE, CAPILLARY: GLUCOSE-CAPILLARY: 409 mg/dL — AB (ref 65–99)

## 2015-07-04 MED ORDER — DICLOFENAC SODIUM 1 % TD GEL
2.0000 g | Freq: Four times a day (QID) | TRANSDERMAL | Status: DC
  Administered 2015-07-04 – 2015-07-05 (×3): 2 g via TOPICAL
  Filled 2015-07-04: qty 100

## 2015-07-04 MED ORDER — INSULIN GLARGINE 100 UNIT/ML ~~LOC~~ SOLN
5.0000 [IU] | Freq: Every day | SUBCUTANEOUS | Status: DC
  Administered 2015-07-04: 5 [IU] via SUBCUTANEOUS
  Filled 2015-07-04 (×2): qty 0.05

## 2015-07-04 MED ORDER — INSULIN ASPART 100 UNIT/ML ~~LOC~~ SOLN
0.0000 [IU] | SUBCUTANEOUS | Status: DC
  Administered 2015-07-04: 7 [IU] via SUBCUTANEOUS
  Administered 2015-07-04: 5 [IU] via SUBCUTANEOUS
  Administered 2015-07-04 – 2015-07-05 (×2): 3 [IU] via SUBCUTANEOUS
  Administered 2015-07-05 (×2): 1 [IU] via SUBCUTANEOUS

## 2015-07-04 NOTE — Progress Notes (Signed)
Report called to Nurse on [facility].  Patient transported via wheelchair to [facility] Room 16.  Daughter and family friend accompanied patient.  Sander Nephew, RN 07/04/2015 1:15 PM

## 2015-07-04 NOTE — H&P (Signed)
Date: 07/04/2015               Patient Name:  [name] MRN: [id number]  DOB: 1931-07-21 Age / Sex: 82 y.o., female   PCP: Milagros Loll, MD         Medical Service: Internal Medicine Teaching Service         Attending Physician: Dr. Aldine Contes, MD    First Contact: Dr. Berline Lopes Pager: [phone]  Second Contact: Dr. Albin Felling Pager: [phone]       After Hours (After 5p/  First Contact Pager: [phone]  weekends / holidays): Second Contact Pager: [phone]   Chief Complaint: "She blacked out."  History of Present Illness:  [name] is an 82 year old lady on home-hospice care with a relevant history of ischemic cardiomyopathy with an ejection fraction of 30-35%, vascular dementia, and chronic kidney disease stage IV, recently discharged on home hospice from the hospital on 4/23 for a heart failure exacerbation and community-acquired pneumonia presenting with acute somnolence after receiving her medications this morning.  About 30 minutes after she got all of her medications this morning, she started becoming very sleepy and felt like a "sack of potatoes." Her daughter had a clinic appointment, so she managed to get her into a wheelchair. Prior to this episode, she had been having about 3 loose stools daily, but did not complain of dysuria, headache, shortness of breath, or other complaints. When talking to the patient, she is complaining only of lower back pain. Her family says they want her to be comfortable and do not want aggressive measures. They'd like to get her home where she is more comfortable. We explained her prognosis is likely weeks to months; the family was understanding.  Meds:  Current Facility-Administered Medications  Medication Dose Route Frequency Provider Last Rate Last Dose  . diclofenac sodium (VOLTAREN) 1 % transdermal gel 2 g  2 g Topical QID Loleta Chance, MD       Allergies: Allergies as of 07/04/2015  . (No Known Allergies)   Past Medical  History  Diagnosis Date  . Hyperlipidemia   . Hypertension   . Diabetes mellitus   . Chronic kidney disease     stage IV  . RAS (renal artery stenosis) ([place])   . Stroke [facility])     s/p CVA unknown timing  . Ischemic cardiomyopathy     EF 20%  . Dementia   . CAD (coronary artery disease)     s/p PTCA May 2005, stenting of an intermittent blanch of the CMX and the angioplasty of teh diagonal branch to the left descending  . Cardiomyopathy, ischemic    Past Surgical History  Procedure Laterality Date  . Appendectomy    . Total abdominal hysterectomy w/ bilateral salpingoophorectomy    . Angioplasty      and stent   Family History  Problem Relation Age of Onset  . Kidney disease Brother    Social History   Social History  . Marital Status: Widowed    Spouse Name: N/A  . Number of Children: N/A  . Years of Education: N/A   Occupational History  . Not on file.   Social History Main Topics  . Smoking status: Never Smoker   . Smokeless tobacco: Never Used  . Alcohol Use: No  . Drug Use: No  . Sexual Activity: Not on file   Other Topics Concern  . Not on file   Social History Narrative  Patient gets regular exercise- walking   Lives with husband and son; son manages medications      No FH of colon cancer   Review of Systems  Constitutional: Positive for weight loss and malaise/fatigue. Negative for fever and chills.  Respiratory: Negative for shortness of breath.   Cardiovascular: Negative for chest pain and leg swelling.  Genitourinary: Negative for dysuria and flank pain.  Musculoskeletal: Positive for back pain. Negative for falls.  Skin: Negative for rash.  Neurological: Positive for dizziness and loss of consciousness. Negative for focal weakness.   Physical Exam: Blood pressure 92/51, pulse 61, resp. rate 16, SpO2 100 %. General: resting in bed comfortably, confused and not oriented to place or time Cardiac: regular rate and rhythm, no rubs, murmurs or  gallops, cold extremities Pulm: breathing well, bibasilar crackles Abd: bowel sounds normal, soft, nondistended, non-tender Ext: warm and well perfused, without pedal edema, no skin breakdown Neuro: alert and oriented to self only, cranial nerves II-XII grossly intact, moving all extremities well  Assessment & Plan by Problem:  [name] is an 82 year old lady on home-hospice care with a relevant history of ischemic cardiomyopathy with an ejection fraction of 30-35%, vascular dementia, and chronic kidney disease stage IV, presenting with acute somnolence after receiving her medications this morning. On exam, her extremities are cold; I suspect she has chronic cold, dry heart failure acutely worsened by antihypertensives. We will hold all of her medications and discharge her back to home hospice tomorrow with furosemide as needed for comfort.   Acute somnolence: Per above; I think this is cold, dry heart failure worsened by antihypertensives. We will not aggressively work-up other causes as she is on hospice and her family wants her to be comfortable, and we will hold almost all of her medications upon discharge. -Holding all medications overnight -Voltaren gel and heating pad for back pain -Consider discharging on furosemide as needed for shortness of breath, morphine for pain and air-hunger, and Voltaren gel and heat pad for back pain -Discharge back to home hospice tomorrow  Dispo: Disposition is deferred at this time, awaiting improvement of current medical problems. Anticipated discharge in approximately 1 day(s).   The patient does have a current PCP Milagros Loll, MD) and does need an [facility] hospital follow-up appointment after discharge.  The patient does have transportation limitations that hinder transportation to clinic appointments.  Signed: Loleta Chance, MD 07/04/2015, 2:14 PM

## 2015-07-04 NOTE — Assessment & Plan Note (Addendum)
Differential includes infection, electrolyte abnormality, ACS, CVA, polypharmacy. Difficult to obtain temperature as patient noncooperative. Recently finished abx for pneumonia. She is having diarrhea but her family is giving her stool softeners. No gross neuro deficits. Suspect that this is most likely polypharmacy though it is possible that she has become dehydrated on Lasix and having diarrhea - causing uremia.  Admit for observation. Further workup per admitting team.

## 2015-07-04 NOTE — Progress Notes (Signed)
Subjective:    Patient ID: [name], female    DOB: [birth year], 82 y.o.   MRN: [id number]  HPI [name] is an 82 year old woman with history of hyperlipidemia, hypertension, diabetes type 2, chronic kidney disease stage V, CVA, CAD, severe dementia presenting for follow-up of her DM.   Accompanied by daughter, [name]. Came home on Sunday. Was evaluated by hospice yesterday. Overall seems more tired and does not want to move around much.   BS 243 this morning after breakfast. Last night 124 last night. 340s 4/24 at 1:40PM after lunch. Gets 15u in the morning and 8u at night of her insulin. Eats chicken, potatoes, steak, vegetables. PO intake normal.   Having diarrhea since she came home. Watery and loose. Having a lot of bowel movements. She is on a stool softener.   Woke up and had breakfast this morning. As they were preparing to bring her to the clinic appointment, she became more somnolent. She received her morning medications. [name]'s daughter puts together the medications and crushes them for her. [name] is uncertain if any of the prn medications were in this.  Review of Systems Constitutional: no fevers Respiratory: no shortness of breath Cardiovascular: no chest pain Gastrointestinal: no nausea/vomiting, no abdominal pain, no constipation, +diarrhea  Past Medical History  Diagnosis Date  . Hyperlipidemia   . Hypertension   . Diabetes mellitus   . Chronic kidney disease     stage IV  . RAS (renal artery stenosis) ([place])   . Stroke [facility])     s/p CVA unknown timing  . Ischemic cardiomyopathy     EF 20%  . Dementia   . CAD (coronary artery disease)     s/p PTCA May 2005, stenting of an intermittent blanch of the CMX and the angioplasty of teh diagonal branch to the left descending  . Cardiomyopathy, ischemic     Current Outpatient Prescriptions on File Prior to Visit  Medication Sig Dispense Refill  . albuterol (PROVENTIL HFA;VENTOLIN HFA) 108 (90 Base) MCG/ACT  inhaler Inhale 2 puffs into the lungs every 6 (six) hours as needed for wheezing or shortness of breath. 1 Inhaler 2  . amLODipine (NORVASC) 10 MG tablet TAKE 1 TABLET BY MOUTH EVERY DAY 90 tablet 3  . aspirin EC 81 MG EC tablet Take 1 tablet (81 mg total) by mouth daily. 30 tablet 3  . atorvastatin (LIPITOR) 80 MG tablet Take 1 tablet (80 mg total) by mouth daily at 6 PM. 30 tablet 3  . BD INSULIN SYRINGE ULTRAFINE 31G X 5/16" 0.3 ML MISC 1 SYRINGE BY DOES NOT APPLY ROUTE 2 (TWO) TIMES DAILY. 60 each 3  . Blood Glucose Monitoring Suppl (ACCU-CHEK AVIVA PLUS) w/Device KIT Use to check blood glucose as instructed. 1 kit 0  . carvedilol (COREG) 25 MG tablet Take 1 tablet (25 mg total) by mouth 2 (two) times daily with a meal. 60 tablet 1  . clopidogrel (PLAVIX) 75 MG tablet Take 1 tablet (75 mg total) by mouth daily. 90 tablet 4  . CVS IBUPROFEN 200 MG tablet Take 200 mg by mouth every 4 (four) hours as needed. for pain  2  . CVS SENNA PLUS 8.6-50 MG tablet Take 1 tablet by mouth 2 (two) times daily as needed. For constipation  2  . feeding supplement, GLUCERNA SHAKE, (GLUCERNA SHAKE) LIQD Take 237 mLs by mouth 2 (two) times daily between meals. 237 mL 30  . furosemide (LASIX) 40 MG tablet Take 1.5  tablets (60 mg total) by mouth daily. 45 tablet 3  . glucose blood (ACCU-CHEK AVIVA PLUS) test strip Use to test blood glucose 3 times daily. Dx: E13.9 100 each 12  . hydrALAZINE (APRESOLINE) 25 MG tablet TAKE 1 TABLET (25 MG TOTAL) BY MOUTH 2 (TWO) TIMES DAILY. 60 tablet 3  . hydrOXYzine (ATARAX/VISTARIL) 10 MG tablet Take 10 mg by mouth every 6 (six) hours as needed. For itching  1  . Insulin Isophane & Regular Human (HUMULIN 70/30 KWIKPEN) (70-30) 100 UNIT/ML PEN Inject subcutaneously 15 units in the morning and 8 units in the evening. 15 mL 2  . Insulin Pen Needle (ULTRA-THIN II MINI PEN NEEDLE) 31G X 5 MM MISC Use to inject insulin twice a day. Dx code: E13.9 100 each 2  . isosorbide mononitrate  (IMDUR) 30 MG 24 hr tablet Take 1 tablet (30 mg total) by mouth daily. 30 tablet 1  . temazepam (RESTORIL) 15 MG capsule Take 15 mg by mouth at bedtime as needed and may repeat dose one time if needed. For sleep.  1   No current facility-administered medications on file prior to visit.      Objective:   Physical Exam Blood pressure 91/49, pulse 69, height _0  (1.626 m), SpO2 100 %. General Apperance: NAD, somnolent HEENT: Normocephalic, atraumatic, anicteric sclera Neck: Supple, trachea midline Lungs: Clear to auscultation bilaterally.  Heart: Regular rate and rhythm Abdomen: Soft, nondistended Extremities: Warm and well perfused, no edema Skin: No rashes or lesions Neurologic: Somnolent. Some mumbling to voice. Does not open eyes. Grossly even movement bilaterally.    Assessment & Plan:  Please refer to problem based charting.

## 2015-07-04 NOTE — Assessment & Plan Note (Addendum)
Assessment: BP 91/49. Issues in past with elevated BP.  Plan: Continue Lasix 60 mg daily, carvedilol 25 mg BID, Hydralazine 25 mg BID, Imdur 30 mg daily Consider discontinuing amlodipine 10 mg daily

## 2015-07-04 NOTE — Assessment & Plan Note (Signed)
Blood glucose 400 this morning.  Continue to monitor Obtain Hgb A1c as inpatient

## 2015-07-05 DIAGNOSIS — R400 Somnolence: Secondary | ICD-10-CM | POA: Diagnosis not present

## 2015-07-05 LAB — GLUCOSE, CAPILLARY
GLUCOSE-CAPILLARY: 282 mg/dL — AB (ref 65–99)
GLUCOSE-CAPILLARY: 223 mg/dL — AB (ref 65–99)
GLUCOSE-CAPILLARY: 134 mg/dL — AB (ref 65–99)
GLUCOSE-CAPILLARY: 130 mg/dL — AB (ref 65–99)
Glucose-Capillary: 336 mg/dL — ABNORMAL HIGH (ref 65–99)

## 2015-07-05 MED ORDER — MORPHINE SULFATE (CONCENTRATE) 10 MG /0.5 ML PO SOLN: 10.0000 mg | ORAL | Status: AC | PRN

## 2015-07-05 MED ORDER — DICLOFENAC SODIUM 1 % TD GEL: 2.0000 g | Freq: Four times a day (QID) | TRANSDERMAL | Status: AC

## 2015-07-05 NOTE — Care Management Note (Signed)
Case Management Note  Patient Details  Name: [name] MRN: [id number] Date of Birth: 1931-03-14  Subjective/Objective:                    Action/Plan: Plan is for patient to discharge home today and continue services with [facility]. CM met with the patient and her daughter and they state that [facility] was just in visiting an are aware of her returning home today. CM consulted to arrange for transportation home. The pts daughter asked that transport be after 2 pm so that family would be available. CM called [facility] and arranged transportation home for after 2 pm. Will update the bedside RN.   Expected Discharge Date:                  Expected Discharge Plan:  Home w Hospice Care  In-House Referral:     Discharge planning Services  CM Consult  Post Acute Care Choice:    Choice offered to:     DME Arranged:    DME Agency:     HH Arranged:    [place] Agency:     Status of Service:  Completed, signed off  Medicare Important Message Given:    Date Medicare IM Given:    Medicare IM give by:    Date Additional Medicare IM Given:    Additional Medicare Important Message give by:     If discussed at [employer] of Stay Meetings, dates discussed:    Additional Comments:  Pollie Friar, RN 07/05/2015, 11:30 AM

## 2015-07-05 NOTE — Progress Notes (Signed)
[place]- [id number]  [facility] [place]  This is a GIP related covered admission of 07/04/15 with a [facility] dx of ES Renal disease.   Code status is DNR   Patient was a direct admit  from a clinic appt with Dr. [name].   It was reported by family that she had an episode at home where she "became somnolent" and blacked out.  Family reported difficulty getting her into the car to transport to appt.  Patient has also been having loose stools up to 3 x day.   Visited patient at bedside. She is arousable but confused. Daughter Ebony Cargo at bedside and stated she wasn't certain exactly what happened.   She did say that she is aware the patient is being discharged back home today.   Spoke with Dr. Marlowe Sax who confirmed planned discharge today for full comfort care.   All medications are discontinued except for insulin, lasix, and Voltaren gel and she is being started on liquid Morphine for comfort.  TC was placed to daughter [name] to discuss DME needs.  She is requesting hospital bed, OBT and walker.     [place] and SW notified of planned discharge and will see patient in the home later this afternoon.  [facility] will continue to work with family and provide education on End of life care , hospice philosophy, and plan of care.    [facility] will continue to follow patient until discharge. Please call with any questions or concerns.   Mickie Kay, [facility]  [phone]

## 2015-07-05 NOTE — Discharge Summary (Signed)
Name: [name] MRN: [id number] DOB: [birth year] 82 y.o. PCP: Milagros Loll, MD  Date of Admission: 07/04/2015  1:43 PM Date of Discharge: 07/05/2015 Attending Physician: Aldine Contes, MD  Discharge Diagnosis: 1.  Active Problems:   Somnolence  Discharge Medications:   Medication List    STOP taking these medications        amLODipine 10 MG tablet  Commonly known as:  NORVASC     aspirin 81 MG EC tablet     atorvastatin 80 MG tablet  Commonly known as:  LIPITOR     carvedilol 25 MG tablet  Commonly known as:  COREG     clopidogrel 75 MG tablet  Commonly known as:  PLAVIX     [facility] IBUPROFEN 200 MG tablet  Generic drug:  ibuprofen     hydrALAZINE 25 MG tablet  Commonly known as:  APRESOLINE     hydrOXYzine 10 MG tablet  Commonly known as:  ATARAX/VISTARIL     isosorbide mononitrate 30 MG 24 hr tablet  Commonly known as:  IMDUR     temazepam 15 MG capsule  Commonly known as:  RESTORIL      TAKE these medications        ACCU-CHEK AVIVA PLUS w/Device Kit  Use to check blood glucose as instructed.     albuterol 108 (90 Base) MCG/ACT inhaler  Commonly known as:  PROVENTIL HFA;VENTOLIN HFA  Inhale 2 puffs into the lungs every 6 (six) hours as needed for wheezing or shortness of breath.     BD INSULIN SYRINGE ULTRAFINE 31G X 5/16" 0.3 ML Misc  Generic drug:  Insulin Syringe-Needle U-100  1 SYRINGE BY DOES NOT APPLY ROUTE 2 (TWO) TIMES DAILY.     [facility] SENNA PLUS 8.6-50 MG tablet  Generic drug:  senna-docusate  Take 1 tablet by mouth 2 (two) times daily as needed. For constipation     diclofenac sodium 1 % Gel  Commonly known as:  VOLTAREN  Apply 2 g topically 4 (four) times daily.     feeding supplement (GLUCERNA SHAKE) Liqd  Take 237 mLs by mouth 2 (two) times daily between meals.     furosemide 40 MG tablet  Commonly known as:  LASIX  Take 1.5 tablets (60 mg total) by mouth daily.     glucose blood test strip  Commonly known as:   ACCU-CHEK AVIVA PLUS  Use to test blood glucose 3 times daily. Dx: E13.9     Insulin Isophane & Regular Human (70-30) 100 UNIT/ML PEN  Commonly known as:  HUMULIN 70/30 KWIKPEN  Inject subcutaneously 15 units in the morning and 8 units in the evening.     Insulin Pen Needle 31G X 5 MM Misc  Commonly known as:  ULTRA-THIN II MINI PEN NEEDLE  Use to inject insulin twice a day. Dx code: E13.9     morphine CONCENTRATE 10 mg / 0.5 ml concentrated solution  Take 0.5 mLs (10 mg total) by mouth every 4 (four) hours as needed for severe pain or shortness of breath.        Disposition and follow-up:   [name] was discharged from [facility] in Good condition.  At the hospital follow up visit please address:  Labs / imaging needed at time of follow-up:   Pending labs/ test needing follow-up:   Follow-up Appointments:   Discharge Instructions:   Consultations:    Procedures Performed:  Dg Chest 2 View  06/25/2015  CLINICAL  DATA:  Acute onset of shaking chills and vomiting. Hyperglycemia. Initial encounter. EXAM: CHEST  2 VIEW COMPARISON:  Chest radiograph performed 11/27/2014 FINDINGS: The lungs are mildly hypoexpanded. Vascular congestion is noted. Bibasilar airspace opacities may reflect mild interstitial edema or possibly pneumonia. No pleural effusion or pneumothorax is seen. The cardiomediastinal silhouette remains normal in size. No acute osseous abnormalities are identified. IMPRESSION: Lungs mildly hypoexpanded. Vascular congestion noted. Bibasilar airspace opacities may reflect mild interstitial edema or possibly pneumonia. Follow-up PA and lateral chest radiograph would be helpful after completion of treatment, to exclude an underlying mass. Electronically Signed   By: Garald Balding M.D.   On: 06/25/2015 02:24   Dg Chest Portable 1 View  06/29/2015  CLINICAL DATA:  Short of breath, altered mental status EXAM: PORTABLE CHEST 1 VIEW COMPARISON:  06/25/15  FINDINGS: Cardiac enlargement. Increased interstitial prominence bilaterally. Increased bilateral perihilar opacities right worse than left. Increased bilateral lower lobe opacities right greater than left suggesting pleural effusion. IMPRESSION: Findings consistent with congestive heart failure and moderate to severe pulmonary edema Electronically Signed   By: Skipper Cliche M.D.   On: 06/29/2015 12:08   Admission HPI: [name] is an 82 year old lady on home-hospice care with a relevant history of ischemic cardiomyopathy with an ejection fraction of 30-35%, vascular dementia, and chronic kidney disease stage IV, recently discharged on home hospice from the hospital on 4/23 for a heart failure exacerbation and community-acquired pneumonia presenting with acute somnolence after receiving her medications this morning.  About 30 minutes after she got all of her medications this morning, she started becoming very sleepy and felt like a "sack of potatoes." Her daughter had a clinic appointment, so she managed to get her into a wheelchair. Prior to this episode, she had been having about 3 loose stools daily, but did not complain of dysuria, headache, shortness of breath, or other complaints. When talking to the patient, she is complaining only of lower back pain. Her family says they want her to be comfortable and do not want aggressive measures. They'd like to get her home where she is more comfortable. We explained her prognosis is likely weeks to months; the family was understanding.  Hospital Course by problem list: Active Problems:   Somnolence   Acute somnolence: Likely secondary to polypharmacy. We did not aggressively work-up other causes as patient is on hospice and her family wanted her to be comfortable. The following day she was awake, alert, and back to her baseline mental status. She was feeling well and did not have any complaints. Most of her home medications were discontinued upon discharge,  except Albuterol, Insulin, and Lasix. She was prescribed Morphine sulfate for comfort measures.   Discharge Vitals:   BP 154/66 mmHg  Pulse 98  Temp(Src) 100.2 F (37.9 C) (Oral)  Resp 20  SpO2 100%  Discharge Labs:  Results for orders placed or performed during the hospital encounter of 07/04/15 (from the past 24 hour(s))  Glucose, capillary     Status: Abnormal   Collection Time: 07/04/15  5:48 PM  Result Value Ref Range   Glucose-Capillary 336 (H) 65 - 99 mg/dL  Glucose, capillary     Status: Abnormal   Collection Time: 07/04/15  8:25 PM  Result Value Ref Range   Glucose-Capillary 282 (H) 65 - 99 mg/dL   Comment 1 Notify RN    Comment 2 Document in Chart   Glucose, capillary     Status: Abnormal   Collection Time:  07/04/15 11:09 PM  Result Value Ref Range   Glucose-Capillary 223 (H) 65 - 99 mg/dL   Comment 1 Notify RN    Comment 2 Document in Chart   Glucose, capillary     Status: Abnormal   Collection Time: 07/05/15  4:37 AM  Result Value Ref Range   Glucose-Capillary 134 (H) 65 - 99 mg/dL   Comment 1 Notify RN    Comment 2 Document in Chart   Glucose, capillary     Status: Abnormal   Collection Time: 07/05/15  7:50 AM  Result Value Ref Range   Glucose-Capillary 130 (H) 65 - 99 mg/dL    Signed: Shela Leff, MD 07/05/2015, 1:26 PM    Services Ordered on Discharge:  Equipment Ordered on Discharge:

## 2015-07-05 NOTE — Progress Notes (Signed)
  Date: 07/05/2015  Patient name: [name]  Medical record number: [id number]  Date of birth: 19-Jul-1931   I have seen and evaluated [name] and discussed their care with the Residency Team. [name] is well known to our team. This is her 3rd admission this month.  CC : She blacked out  HPI : most details obtained from chart and [facility] physicians as [name] has dementia. Per report, the family didn't know which meds to give pt when so they gave all meds (but doesn't know names) all at once. Pt then became somnolent. She had an [facility] appt and family was able to get her here. Her BP was 91/49 and she was somnolent and it was recognized that she needed admission. Her meds except insulin were held and she is now at baseline - alert and answering simple questions.   PMHx, Fam Hx, and/or Soc Hx : Recent AMI, systolic HF, DM, dementia, CKD stage 4. Lives with family, enrolled in hospice.  Filed Vitals:   07/05/15 0438 07/05/15 1050  BP: 100/71 154/66  Pulse: 95 98  Temp: 98.2 F (36.8 C) 100.2 F (37.9 C)  Resp: 16 20   Alert. Answers simple questions. At baseline compared to last 2 admits. HRRR LCTAB anteriorly  Assessment and Plan: I have seen and evaluated the patient as outlined above. I agree with the formulated Assessment and Plan as detailed in the residents' note, with the following changes:   1. Acute encephalopathy - resolved with time and stopping meds.  The big issue here is that she is hospice, she has had 3 admits this month, she has several medical issues that are not reversible (CKD, CAD, dementia), her family does not understand med mgmt. Our goal, and discussions with family this admit and last admit show that the family also agrees, is comfort. Since there is med mgmt confusion and most meds will not extend life, we will stop almost all meds. We explained to daughter at bedside that we do not care about tight glucose control and are OK if it is in 300's or 400's.  We explained that [name] can eat anything she eats - even if unhealthy.  D/C home Ask home hospice to remove all med bottles except Rx meds Lasix scheduled to prevent pul edema Insulin to prevent symptomatic hyperglycemia MSo4 and voltarin gel for pain PRN   Bartholomew Crews, MD 4/26/20173:10 PM

## 2015-07-05 NOTE — Care Management Obs Status (Signed)
[facility] NOTIFICATION   Patient Details  Name: [name] MRN: [id number] Date of Birth: [birth year]   Medicare Observation Status Notification Given:  Yes (comfort measures only, no aggressive treatment. discharging home with hospice)    Rolm Baptise, RN 07/05/2015, 11:50 AM

## 2015-07-05 NOTE — Progress Notes (Signed)
Subjective: No acute overnight events. Patient is much more conversive this morning and back to her baseline. She denies having any pain anywhere in her body. Denies having any SOB. States she is feeling well and has no complaints.    Objective: Vital signs in last 24 hours: Filed Vitals:   07/04/15 2125 07/05/15 0229 07/05/15 0438 07/05/15 1050  BP: 146/69 166/68 100/71 154/66  Pulse: 89 94 95 98  Temp: 98.1 F (36.7 C) 98.3 F (36.8 C) 98.2 F (36.8 C) 100.2 F (37.9 C)  TempSrc: Axillary Axillary Axillary Oral  Resp: 16 16 16 20   SpO2: 100% 100% 99% 100%   Weight change:  No intake or output data in the 24 hours ending 07/05/15 1314  Physical Exam: General: resting in bed comfortably, alert, much more conversive  Cardiac: regular rate and rhythm, no rubs, murmurs or gallops, pulses intact in bilateral upper and lower extremities  Pulm: breathing well, bibasilar crackles, no wheezes Abd: bowel sounds normal, soft, nondistended, non-tender Ext: warm and well perfused, without pedal edema, no skin breakdown Neuro: alert and oriented to self only, cranial nerves II-XII grossly intact, moving all extremities well  Lab Results: Basic Metabolic Panel:  Recent Labs Lab 07/01/15 0250 07/02/15 0313  NA 140 133*  K 4.0 3.8  CL 108 102  CO2 19* 19*  GLUCOSE 67 251*  BUN 38* 43*  CREATININE 4.13* 4.07*  CALCIUM 8.4* 8.2*   Liver Function Tests:  Recent Labs Lab 07/01/15 0250  AST 26  ALT 15  ALKPHOS 62  BILITOT 0.8  PROT 6.0*  ALBUMIN 2.6*   CBC:  Recent Labs Lab 07/01/15 0250 07/02/15 0313  WBC 18.7* 16.7*  HGB 8.0* 7.6*  HCT 24.6* 24.1*  MCV 94.6 94.9  PLT 293 299   CBG:  Recent Labs Lab 07/04/15 1105 07/04/15 1748 07/04/15 2025 07/04/15 2309 07/05/15 0437 07/05/15 0750  GLUCAP 409* 336* 282* 223* 134* 130*   Micro Results: No results found for this or any previous visit (from the past 240 hour(s)). Studies/Results: No results  found. Medications: I have reviewed the patient's current medications. Scheduled Meds: . diclofenac sodium  2 g Topical QID  . insulin aspart  0-9 Units Subcutaneous Q4H  . insulin glargine  5 Units Subcutaneous QHS   Continuous Infusions:  PRN Meds:. Assessment/Plan: Active Problems:   Somnolence  [name] is an 82 year old lady on home-hospice care with a relevant history of ischemic cardiomyopathy with an ejection fraction of 30-35%, vascular dementia, and chronic kidney disease stage IV, presenting with acute somnolence after receiving her medications at home.    Acute somnolence: Patient is awake, alert, and much more conversive this morning. SpO2 99-100% on RA. Mental status is back to her baseline. She is feeling well and does not have any complaints. Her somnolence on admission was likely in the setting of polypharmacy. We will not aggressively work-up other causes as she is on hospice and her family wants her to be comfortable, and we will hold most of her home medications upon discharge. -Holding most home medications, except Albuterol, Insulin, and Lasix -Voltaren gel and heating pad for back pain -Morphine sulfate 10 mg/ 0.5 ml q4 prn pain and dyspnea  -Discharge back to home hospice today  Dispo: Patient is stable and will likely be discharged to home hospice today.    The patient does have a current PCP Milagros Loll, MD) and does need an [facility] hospital follow-up appointment after discharge.  The patient does not have transportation limitations that hinder transportation to clinic appointments.  .Services Needed at time of discharge: Y = Yes, Blank = No PT:   OT:   RN:   Equipment:   Other:       Shela Leff, MD 07/05/2015, 1:14 PM

## 2015-07-05 NOTE — Progress Notes (Signed)
Pt discharged from hospital per orders from MD. Pt and spouse were informed of discharge by transport. Pt did not have any IV's throughout stay at hospital. Pt was transported via stretcher.

## 2015-07-05 NOTE — Progress Notes (Signed)
Nutrition Brief Note  Patient identified on the Malnutrition Screening Tool (MST) Report  Wt Readings from Last 15 Encounters:  07/01/15 130 lb 3.2 oz (59.058 kg)  06/25/15 123 lb 14.4 oz (56.2 kg)  12/29/14 128 lb 4.8 oz (58.196 kg)  12/02/14 133 lb 4.8 oz (60.464 kg)  11/29/14 121 lb 0.5 oz (54.9 kg)  11/03/14 132 lb 14.4 oz (60.283 kg)  10/27/14 137 lb 8 oz (62.37 kg)  10/06/14 129 lb 9.6 oz (58.786 kg)  08/18/14 132 lb 14.4 oz (60.283 kg)  04/14/14 135 lb (61.236 kg)  01/28/14 135 lb 4.8 oz (61.372 kg)  01/03/14 134 lb 1.6 oz (60.827 kg)  12/06/13 134 lb (60.782 kg)  11/05/13 130 lb (58.968 kg)  09/13/13 136 lb 3.2 oz (61.78 kg)    Pt is comfort care and is being discharged to home hospice. No nutrition interventions warranted at this time.   Scarlette Ar RD, LDN Inpatient Clinical Dietitian Pager: [phone] After Hours Pager: [phone]

## 2015-07-05 NOTE — Progress Notes (Signed)
I saw and evaluated the patient. I personally confirmed the key portions of Dr. Marjory Sneddon history and exam and reviewed pertinent patient test results. The assessment, diagnosis, and plan were formulated together and I agree with the documentation in the resident's note.  Event was sudden soon after taking meds.  I suspect polypharmacy as a potential cause of her mental statu change.  She is being admitted to observation status to critically review her medications and only continue those that are absolutely going to benefit a woman on Hospice Care.

## 2015-07-06 LAB — GLUCOSE, CAPILLARY: Glucose-Capillary: 222 mg/dL — ABNORMAL HIGH (ref 65–99)

## 2015-07-13 ENCOUNTER — Encounter: Payer: Self-pay | Admitting: Pulmonary Disease

## 2015-08-06 ENCOUNTER — Other Ambulatory Visit: Payer: Self-pay | Admitting: Internal Medicine

## 2015-08-22 DIAGNOSIS — E1042 Type 1 diabetes mellitus with diabetic polyneuropathy: Secondary | ICD-10-CM | POA: Diagnosis not present

## 2015-08-24 ENCOUNTER — Ambulatory Visit: Payer: Medicare Other | Admitting: *Deleted

## 2015-08-31 ENCOUNTER — Other Ambulatory Visit: Payer: Self-pay | Admitting: Internal Medicine

## 2015-11-07 ENCOUNTER — Other Ambulatory Visit: Payer: Self-pay | Admitting: Internal Medicine

## 2015-11-10 DEATH — deceased

## 2016-08-28 IMAGING — CR DG ABDOMEN ACUTE W/ 1V CHEST
3 series · 3 of 3 positions shown · non-contrast
Comparison: Chest x-ray 07/17/2011

CLINICAL DATA: Chest and left flank pain since this morning.
Vomiting.

EXAM:
DG ABDOMEN ACUTE W/ 1V CHEST

[w abdomen decub]
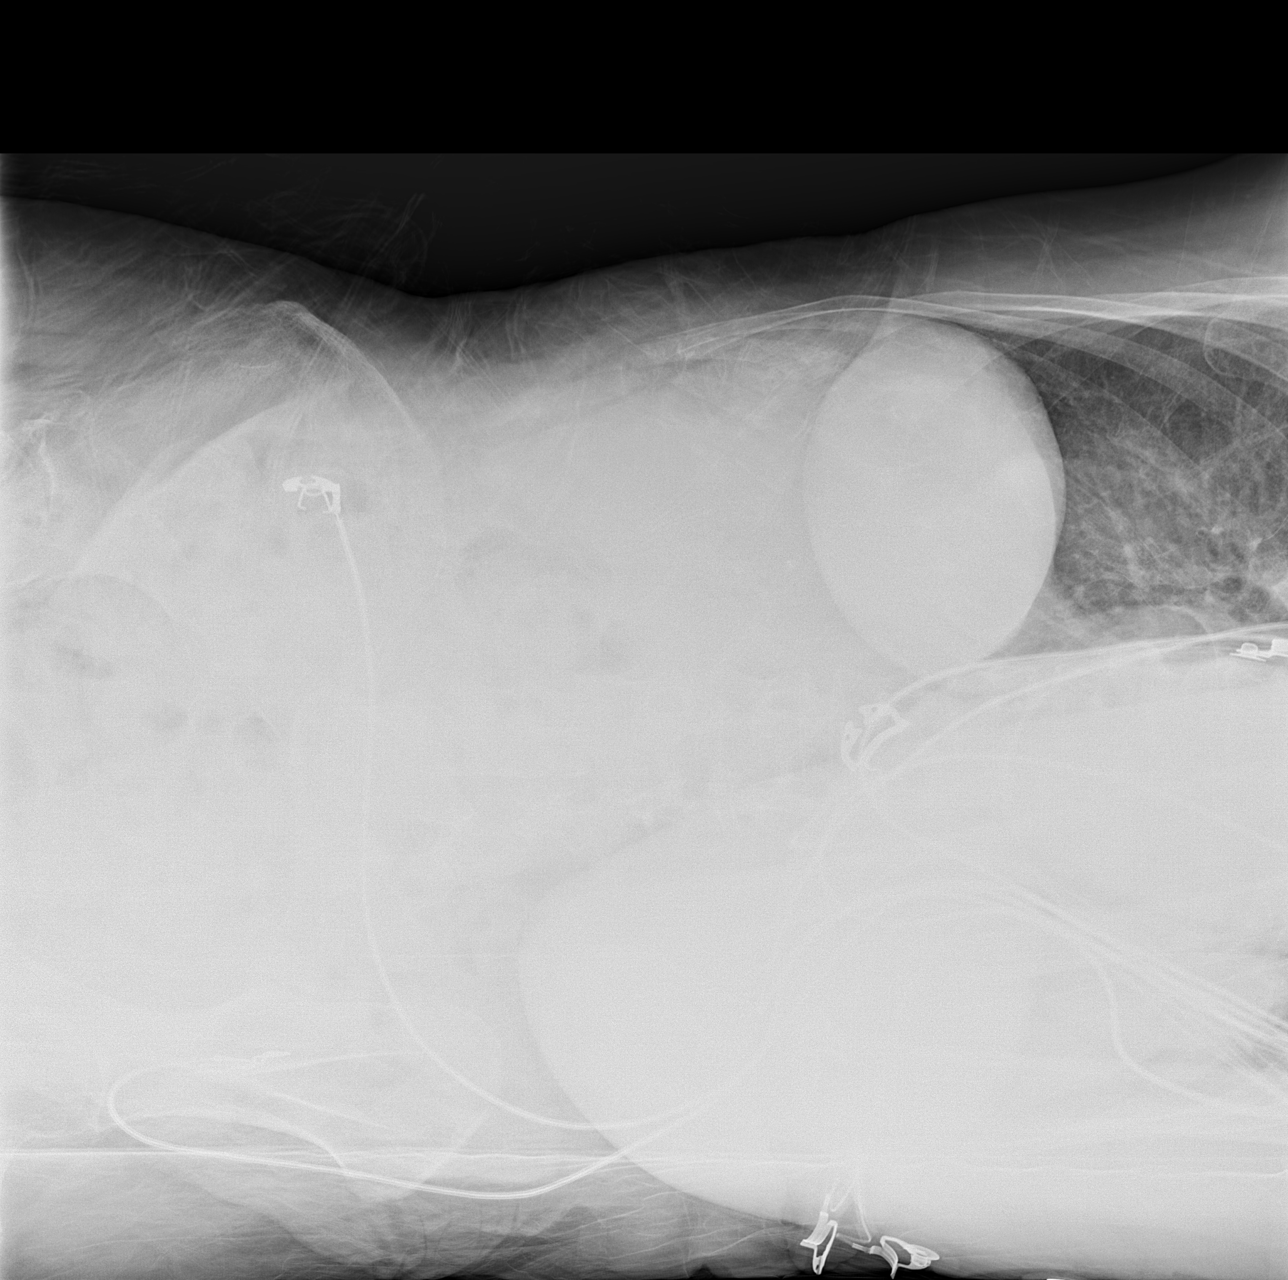

[x abdomen supine]
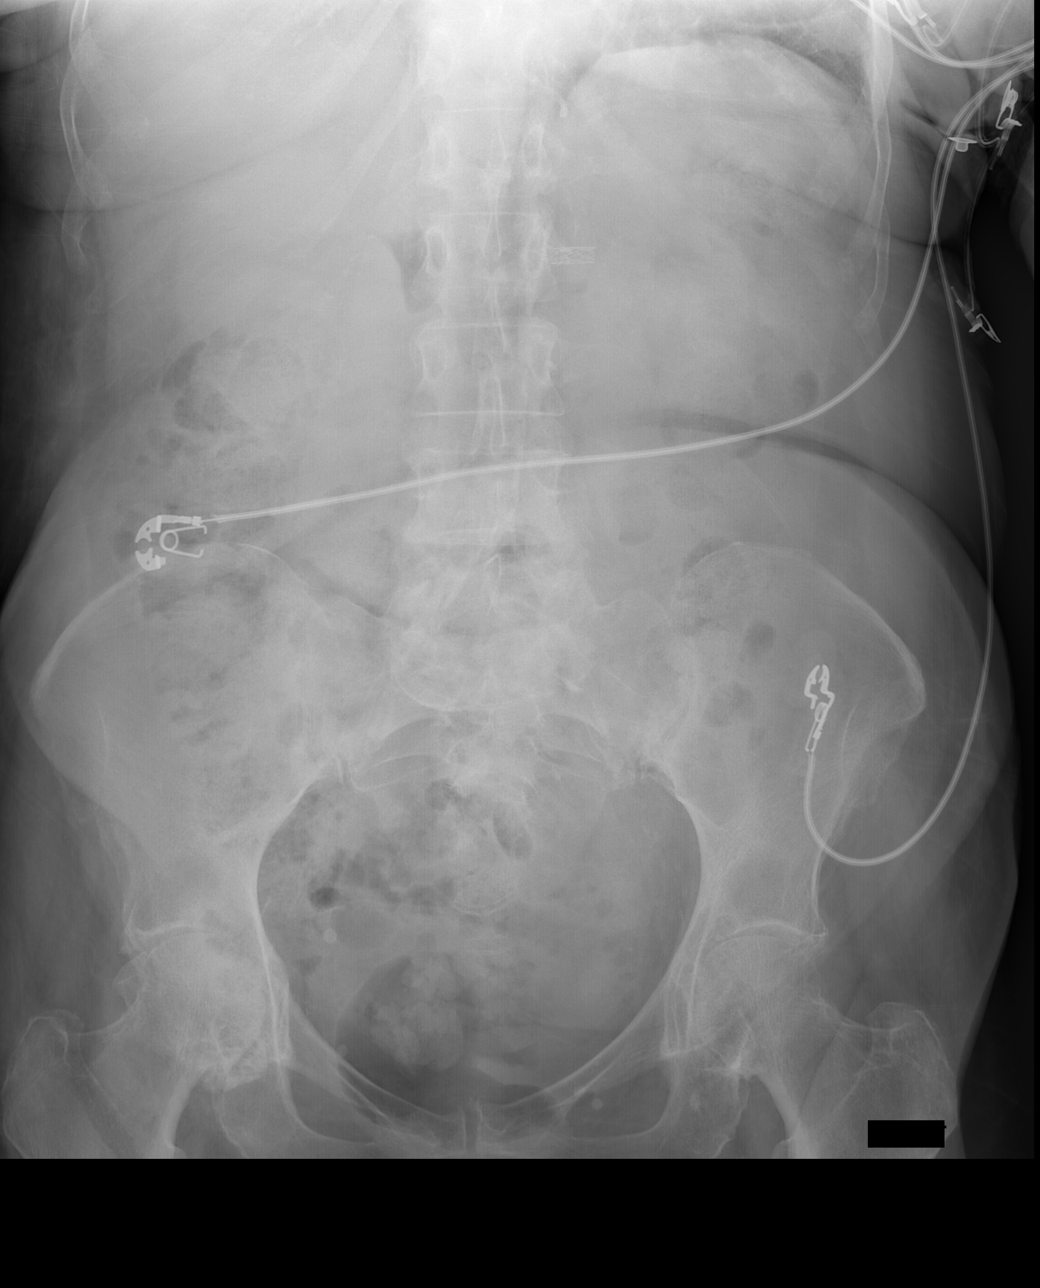

[x chest ap]
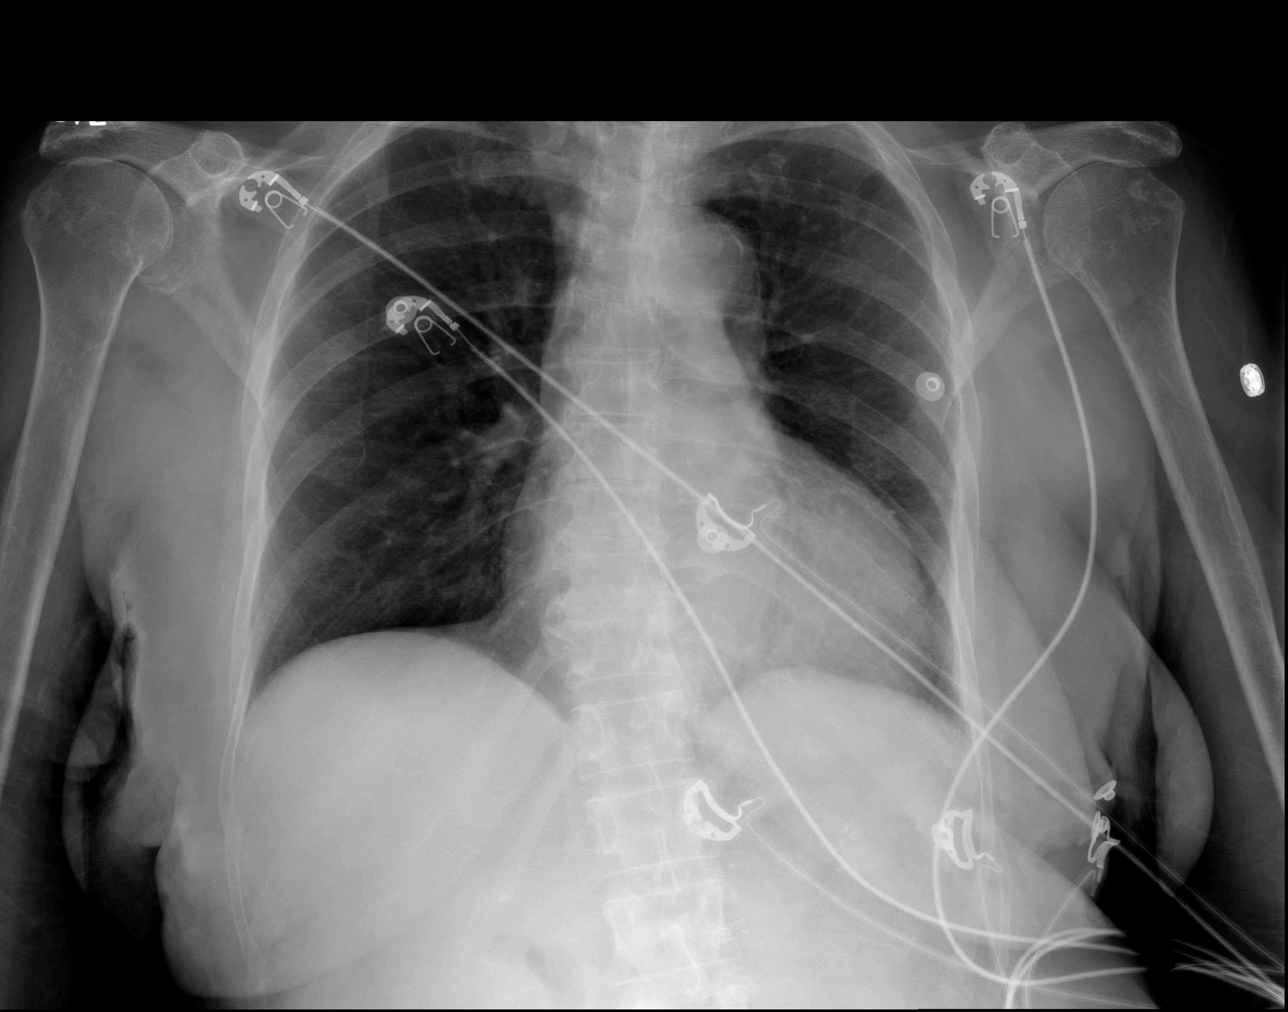

[3 of 3 positions shown; findings below may reference images not displayed]

FINDINGS: The upright chest x-ray demonstrates mild stable cardiac enlargement
and tortuosity and calcification of the thoracic aorta. No acute
pulmonary findings. No pleural effusion.

Two views of the abdomen demonstrate a normal bowel gas pattern. No
findings for obstruction an or perforation. The soft tissue shadows
are maintained. No worrisome calcifications. A left renal artery
stent is noted. Moderate vascular calcifications. There are advanced
hip joint degenerative changes, right much greater than left.
IMPRESSION: No acute cardiopulmonary findings.

No plain film findings for an acute abdominal process.

## 2016-08-28 IMAGING — CT CT RENAL STONE PROTOCOL
2 of 3 series · 16 of 42 positions shown, 18 images · non-contrast
Comparison: CT report 08/05/2002 no images available

CLINICAL DATA: Left flank pain

EXAM:
CT ABDOMEN AND PELVIS WITHOUT CONTRAST
TECHNIQUE: Multidetector CT imaging of the abdomen and pelvis was performed
following the standard protocol without IV contrast.

[Series 4: lung · axial · 0.74mm/px · z∈[-137,-17]mm · 13 of 28 slices shown, 15 images]
[im 3/28  soft-tissue]
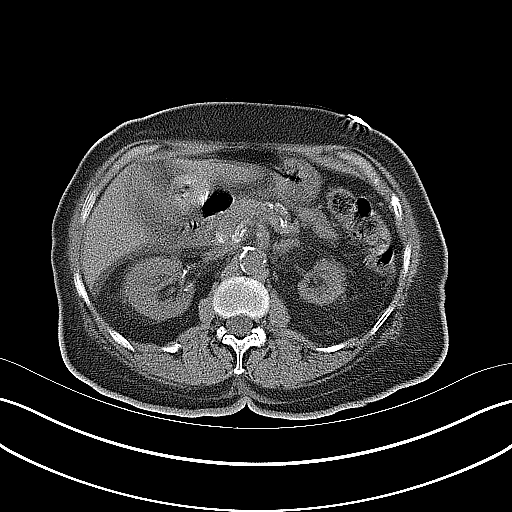
[im 3/28  bone]
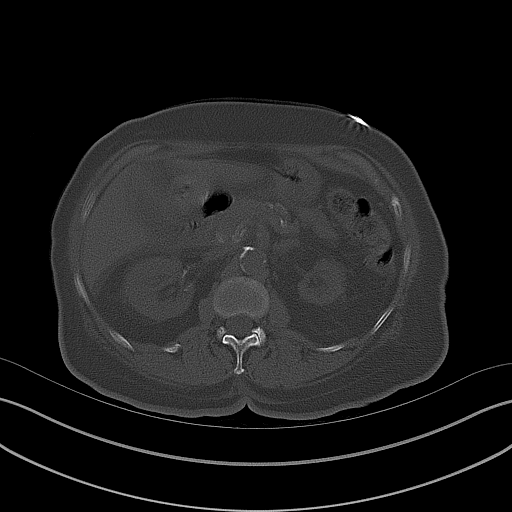
[im 5/28  soft-tissue]
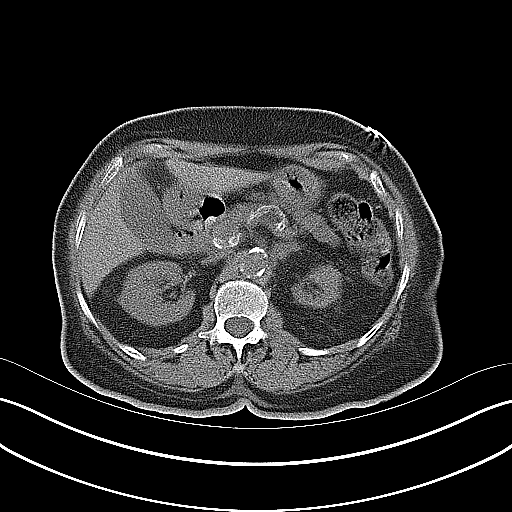
[im 7/28  soft-tissue]
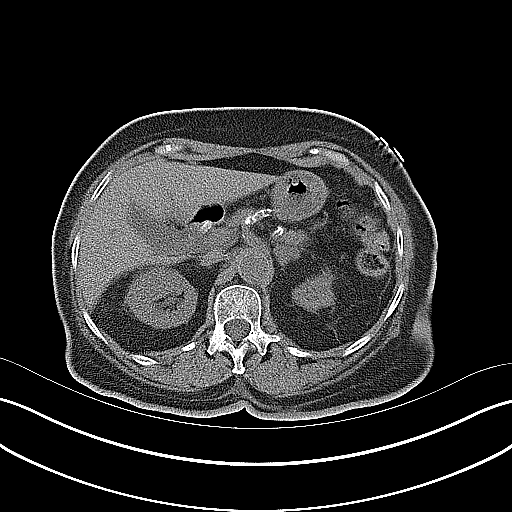
[im 9/28  soft-tissue]
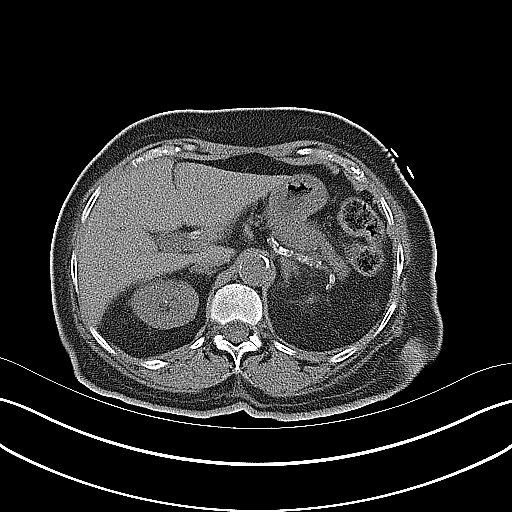
[im 11/28  soft-tissue]
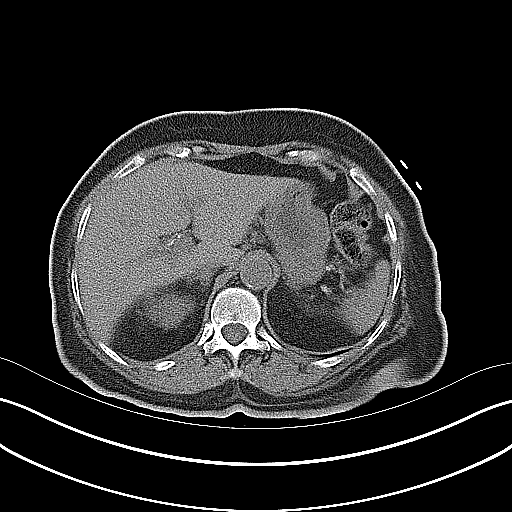
[im 13/28  soft-tissue]
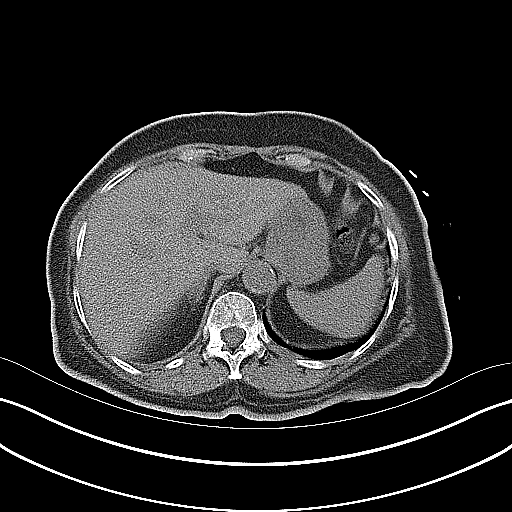
[im 15/28  soft-tissue]
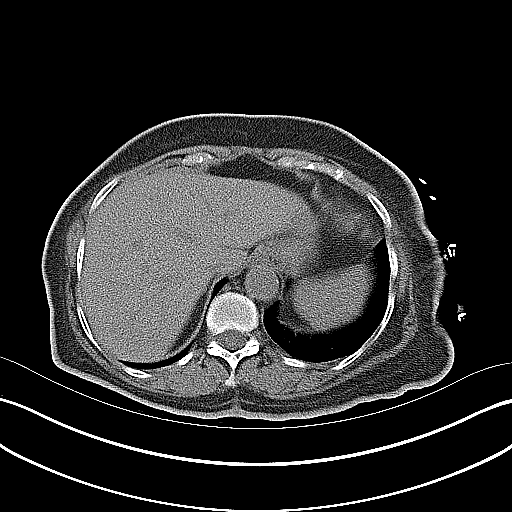
[im 17/28  soft-tissue]
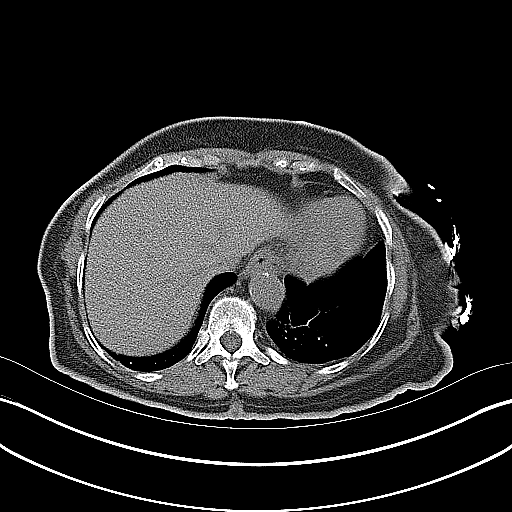
[im 19/28  soft-tissue]
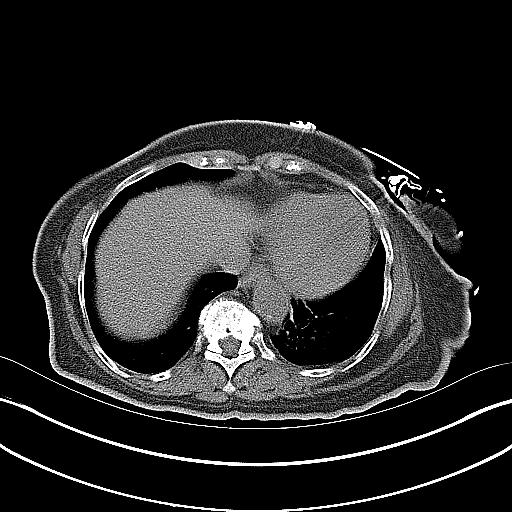
[im 19/28  bone]
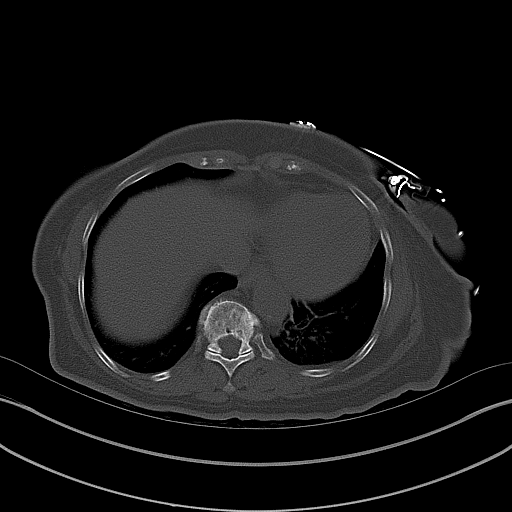
[im 21/28  soft-tissue]
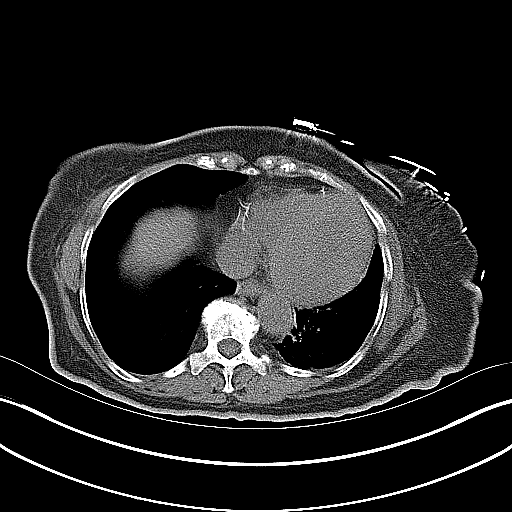
[im 23/28  soft-tissue]
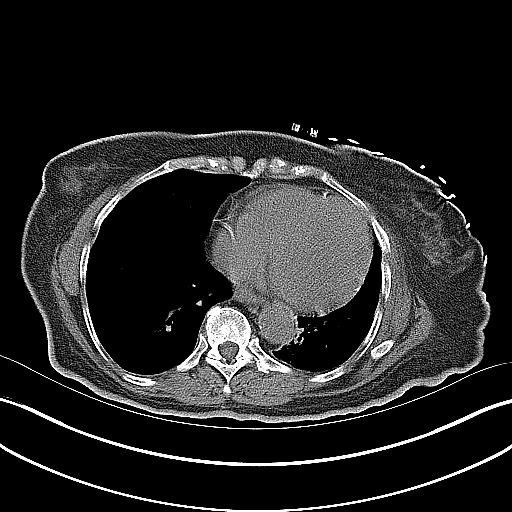
[im 25/28  soft-tissue]
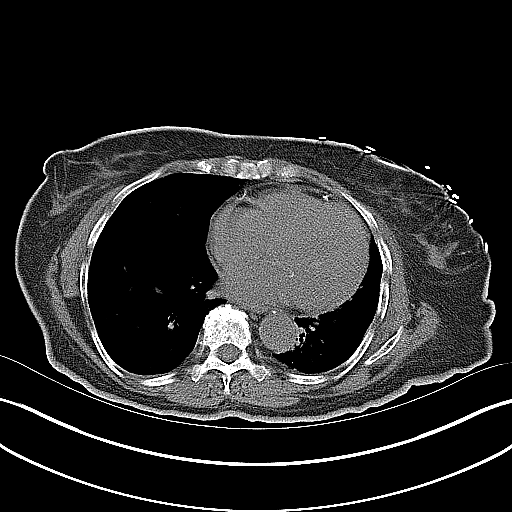
[im 27/28  soft-tissue]
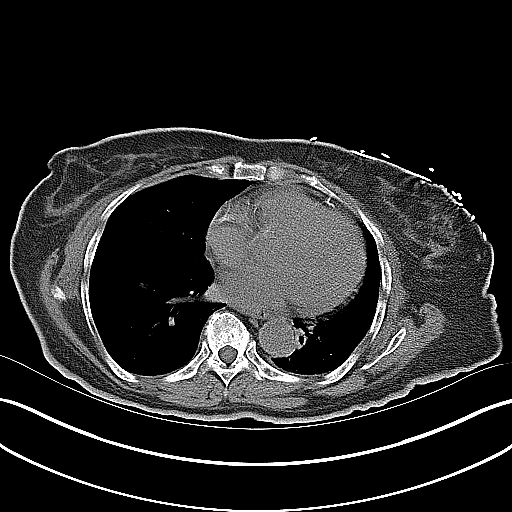

[Series 5: coronal · coronal · 0.76mm/px · 3 of 90 slices shown]
[im 30/90  soft-tissue]
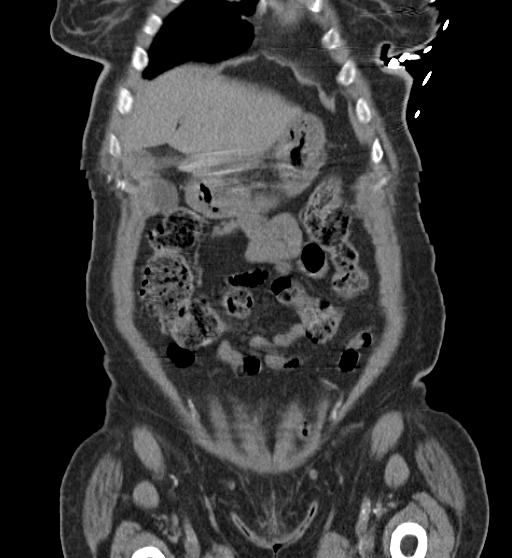
[im 40/90  soft-tissue]
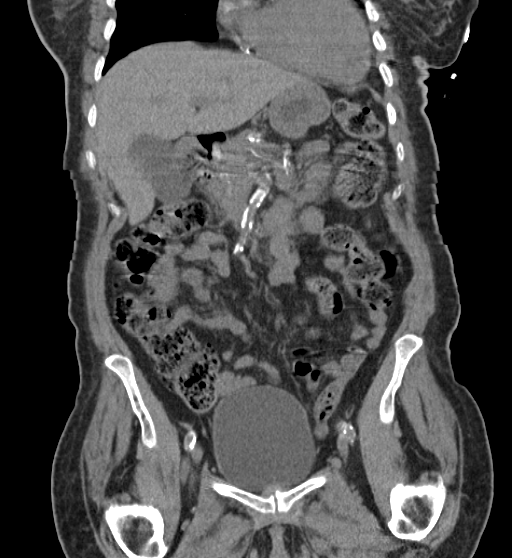
[im 50/90  soft-tissue]
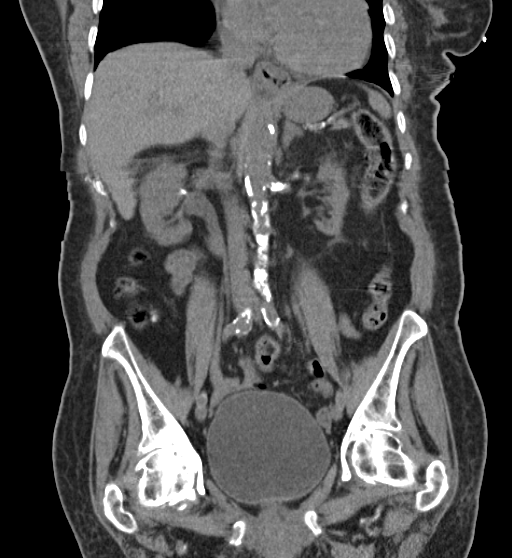

[16 of 42 positions shown; findings below may reference images not displayed]

FINDINGS: Lung bases are unremarkable. Sagittal images of the spine shows
degenerative changes thoracolumbar spine. Extensive atherosclerotic
calcifications of abdominal aorta, SMA, splenic artery and iliac
arteries are noted. No aortic aneurysm.

The left kidney is atrophic. Left renal artery stent is noted.
Vascular calcifications are noted bilateral renal hilum. Unenhanced
liver shows no biliary ductal dilatation. Unenhanced pancreas and
spleen are unremarkable. Bilateral mild thickening of adrenal glands
without definite mass. Small hiatal hernia.

Moderate stool throughout the colon. No pericecal inflammation. Few
colonic diverticula are noted right colon. No evidence of acute
diverticulitis. There is a distended urinary bladder. The uterus is
atrophic. No adnexal mass. Pelvic phleboliths are noted. No
calcified calculi are noted within urinary bladder. Bilateral distal
ureter is unremarkable.

No small bowel obstruction. No ascites or free air. No adenopathy.
There is no inguinal adenopathy. Some colonic gas noted within
rectum. Nonspecific thickening of anal wall. Clinical correlation is
necessary.
IMPRESSION: 1. Extensive atherosclerotic vascular calcifications are noted.
2. No nephrolithiasis. No calcified ureteral calculi. There is
atrophic left kidney. Left renal artery stent is noted.
3. Moderate stool noted throughout the colon.
4. Right colon diverticula.  No evidence of acute diverticulitis.
5. There is a distended urinary bladder. Bilateral distal ureter is
unremarkable. Pelvic phleboliths are noted.
6. Atrophic uterus.
7. No small bowel obstruction.

## 2018-07-10 NOTE — Telephone Encounter (Signed)
This encounter was created in error - please disregard.

## 2018-07-24 ENCOUNTER — Other Ambulatory Visit: Payer: Self-pay
